# Patient Record
Sex: Female | Born: 1990
Health system: Southern US, Community
[De-identification: ages and names within clinical notes are randomized; demographics above are authoritative.]

## PROBLEM LIST (undated history)

## (undated) DIAGNOSIS — D649 Anemia, unspecified: Secondary | ICD-10-CM

## (undated) DIAGNOSIS — T7840XA Allergy, unspecified, initial encounter: Secondary | ICD-10-CM

## (undated) DIAGNOSIS — J45909 Unspecified asthma, uncomplicated: Secondary | ICD-10-CM

## (undated) DIAGNOSIS — F32A Depression, unspecified: Secondary | ICD-10-CM

## (undated) DIAGNOSIS — K219 Gastro-esophageal reflux disease without esophagitis: Secondary | ICD-10-CM

## (undated) DIAGNOSIS — O3440 Maternal care for other abnormalities of cervix, unspecified trimester: Secondary | ICD-10-CM

## (undated) DIAGNOSIS — K589 Irritable bowel syndrome without diarrhea: Secondary | ICD-10-CM

## (undated) DIAGNOSIS — F419 Anxiety disorder, unspecified: Secondary | ICD-10-CM

## (undated) DIAGNOSIS — R87612 Low grade squamous intraepithelial lesion on cytologic smear of cervix (LGSIL): Secondary | ICD-10-CM

## (undated) DIAGNOSIS — Z9889 Other specified postprocedural states: Secondary | ICD-10-CM

## (undated) HISTORY — DX: Low grade squamous intraepithelial lesion on cytologic smear of cervix (LGSIL): R87.612

## (undated) HISTORY — DX: Other specified postprocedural states: O34.40

## (undated) HISTORY — DX: Allergy, unspecified, initial encounter: T78.40XA

## (undated) HISTORY — PX: FRACTURE SURGERY: SHX138

## (undated) HISTORY — PX: INDUCED ABORTION: SHX677

## (undated) HISTORY — DX: Unspecified asthma, uncomplicated: J45.909

## (undated) HISTORY — PX: COLPOSCOPY: SHX161

## (undated) HISTORY — PX: TONSILLECTOMY: SUR1361

## (undated) HISTORY — PX: DILATION AND CURETTAGE OF UTERUS: SHX78

## (undated) HISTORY — DX: Irritable bowel syndrome, unspecified: K58.9

## (undated) HISTORY — DX: Other specified postprocedural states: Z98.890

---
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
## 2009-04-21 DIAGNOSIS — Z720 Tobacco use: Secondary | ICD-10-CM | POA: Insufficient documentation

## 2009-04-21 DIAGNOSIS — J45909 Unspecified asthma, uncomplicated: Secondary | ICD-10-CM | POA: Insufficient documentation

## 2009-08-18 ENCOUNTER — Ambulatory Visit: Payer: Self-pay | Admitting: Gastroenterology

## 2009-08-24 ENCOUNTER — Ambulatory Visit: Payer: Self-pay | Admitting: Gastroenterology

## 2009-08-29 ENCOUNTER — Ambulatory Visit: Payer: Self-pay | Admitting: Gastroenterology

## 2009-11-16 DIAGNOSIS — K589 Irritable bowel syndrome without diarrhea: Secondary | ICD-10-CM | POA: Insufficient documentation

## 2009-11-16 DIAGNOSIS — K580 Irritable bowel syndrome with diarrhea: Secondary | ICD-10-CM | POA: Insufficient documentation

## 2010-04-04 ENCOUNTER — Ambulatory Visit: Payer: Self-pay | Admitting: Family Medicine

## 2011-07-03 ENCOUNTER — Emergency Department: Payer: Self-pay | Admitting: *Deleted

## 2012-01-10 ENCOUNTER — Ambulatory Visit: Payer: Self-pay | Admitting: Orthopedic Surgery

## 2012-01-10 LAB — PREGNANCY, URINE: Pregnancy Test, Urine: NEGATIVE m[IU]/mL

## 2012-08-26 ENCOUNTER — Ambulatory Visit: Payer: Self-pay | Admitting: Family Medicine

## 2014-12-12 NOTE — Op Note (Signed)
PATIENT NAME:  April Harris, April Harris MR#:  [id number] DATE OF BIRTH:  Sep 04, 1990  DATE OF PROCEDURE:  01/10/2012  PREOPERATIVE DIAGNOSIS: Retained birth control implant, left upper arm.   POSTOPERATIVE DIAGNOSIS: Retained birth control implant, left upper arm.    PROCEDURE: Removal of implantable birth control device, left upper arm.   SURGEON: Leitha SchullerMichael J. Jadesola Poynter, MD.   ANESTHESIA: General.   DESCRIPTION OF PROCEDURE: The patient was brought to the Operating Room and after adequate anesthesia was obtained, the left arm was prepped and draped in the usual sterile fashion. After patient identification and time-out procedures were completed, an incision was made proximal to where previous attempt had been made to remove the implant. An approximately 2 cm incision was made and the subcutaneous tissue spread. With a retractor placed, it is possible to spread the tissue and on palpation, the rod was palpable. It had a layer of connective tissue surrounding it. It seemed to be more of a layer of scar tissue. It was still at the level of the subcutaneous tissue and had not gone deep, but it was deep in the subcutaneous tissue. After separating the scar tissue overlying the rod, it was removed without difficulty. Next, the wound was thoroughly irrigated. Hemostasis was checked with electrocautery. The wound was then closed with 3-0 Vicryl subcutaneously, a subcuticular 4-0 Monocryl and Dermabond. The patient was sent to the recovery room in stable condition after infiltration of 10 mL of 0.5% Sensorcaine around the incision, 2 x 2's and tape. The implant was recorded photographically and then discarded.   ESTIMATED BLOOD LOSS: Minimal.  COMPLICATIONS: None.  ____________________________ Leitha SchullerMichael J. Mida Cory, MD mjm:ap D: 01/10/2012 20:57:43 ET T: 01/11/2012 11:08:46 ET JOB#: [id number] cc: Leitha SchullerMichael J. Damaria Vachon, MD, <Dictator> Leitha SchullerMICHAEL J Sharise Lippy MD ELECTRONICALLY SIGNED 01/11/2012 12:34

## 2016-02-13 DIAGNOSIS — Z3046 Encounter for surveillance of implantable subdermal contraceptive: Secondary | ICD-10-CM | POA: Diagnosis not present

## 2016-02-13 DIAGNOSIS — Z113 Encounter for screening for infections with a predominantly sexual mode of transmission: Secondary | ICD-10-CM | POA: Diagnosis not present

## 2016-02-13 DIAGNOSIS — Z3002 Counseling and instruction in natural family planning to avoid pregnancy: Secondary | ICD-10-CM | POA: Diagnosis not present

## 2016-04-20 DIAGNOSIS — Z3049 Encounter for surveillance of other contraceptives: Secondary | ICD-10-CM | POA: Diagnosis not present

## 2016-04-26 DIAGNOSIS — Z01419 Encounter for gynecological examination (general) (routine) without abnormal findings: Secondary | ICD-10-CM | POA: Diagnosis not present

## 2016-04-26 DIAGNOSIS — Z124 Encounter for screening for malignant neoplasm of cervix: Secondary | ICD-10-CM | POA: Diagnosis not present

## 2016-08-28 DIAGNOSIS — J111 Influenza due to unidentified influenza virus with other respiratory manifestations: Secondary | ICD-10-CM | POA: Diagnosis not present

## 2016-09-13 DIAGNOSIS — N760 Acute vaginitis: Secondary | ICD-10-CM | POA: Diagnosis not present

## 2016-09-13 DIAGNOSIS — B001 Herpesviral vesicular dermatitis: Secondary | ICD-10-CM | POA: Diagnosis not present

## 2017-01-01 DIAGNOSIS — J301 Allergic rhinitis due to pollen: Secondary | ICD-10-CM | POA: Diagnosis not present

## 2017-01-01 DIAGNOSIS — J4520 Mild intermittent asthma, uncomplicated: Secondary | ICD-10-CM | POA: Diagnosis not present

## 2017-01-01 DIAGNOSIS — F17210 Nicotine dependence, cigarettes, uncomplicated: Secondary | ICD-10-CM | POA: Diagnosis not present

## 2017-10-28 ENCOUNTER — Encounter: Payer: Self-pay | Admitting: Obstetrics & Gynecology

## 2017-10-28 ENCOUNTER — Emergency Department: Payer: BLUE CROSS/BLUE SHIELD

## 2017-10-28 ENCOUNTER — Ambulatory Visit (INDEPENDENT_AMBULATORY_CARE_PROVIDER_SITE_OTHER): Payer: BLUE CROSS/BLUE SHIELD | Admitting: Obstetrics & Gynecology

## 2017-10-28 ENCOUNTER — Emergency Department
Admission: EM | Admit: 2017-10-28 | Discharge: 2017-10-28 | Disposition: A | Discharge status: Home / Self Care | Payer: BLUE CROSS/BLUE SHIELD | Attending: Emergency Medicine | Admitting: Emergency Medicine

## 2017-10-28 ENCOUNTER — Encounter: Payer: Self-pay | Admitting: Emergency Medicine

## 2017-10-28 VITALS — BP 124/78 | Ht 65.0 in | Wt 172.0 lb

## 2017-10-28 DIAGNOSIS — Z23 Encounter for immunization: Secondary | ICD-10-CM | POA: Diagnosis not present

## 2017-10-28 DIAGNOSIS — F17200 Nicotine dependence, unspecified, uncomplicated: Secondary | ICD-10-CM | POA: Insufficient documentation

## 2017-10-28 DIAGNOSIS — Z124 Encounter for screening for malignant neoplasm of cervix: Secondary | ICD-10-CM | POA: Diagnosis not present

## 2017-10-28 DIAGNOSIS — N926 Irregular menstruation, unspecified: Secondary | ICD-10-CM | POA: Diagnosis not present

## 2017-10-28 DIAGNOSIS — S67190A Crushing injury of right index finger, initial encounter: Secondary | ICD-10-CM | POA: Insufficient documentation

## 2017-10-28 DIAGNOSIS — Y939 Activity, unspecified: Secondary | ICD-10-CM | POA: Insufficient documentation

## 2017-10-28 DIAGNOSIS — S61210A Laceration without foreign body of right index finger without damage to nail, initial encounter: Secondary | ICD-10-CM | POA: Insufficient documentation

## 2017-10-28 DIAGNOSIS — Y929 Unspecified place or not applicable: Secondary | ICD-10-CM | POA: Insufficient documentation

## 2017-10-28 DIAGNOSIS — Y998 Other external cause status: Secondary | ICD-10-CM | POA: Insufficient documentation

## 2017-10-28 DIAGNOSIS — W3189XA Contact with other specified machinery, initial encounter: Secondary | ICD-10-CM | POA: Diagnosis not present

## 2017-10-28 DIAGNOSIS — S6710XA Crushing injury of unspecified finger(s), initial encounter: Secondary | ICD-10-CM

## 2017-10-28 DIAGNOSIS — S6981XA Other specified injuries of right wrist, hand and finger(s), initial encounter: Secondary | ICD-10-CM | POA: Diagnosis not present

## 2017-10-28 MED ORDER — OXYCODONE-ACETAMINOPHEN 5-325 MG PO TABS: 1.0000 | ORAL_TABLET | Freq: Three times a day (TID) | ORAL | 0 refills | Status: DC | PRN

## 2017-10-28 MED ORDER — TETANUS-DIPHTH-ACELL PERTUSSIS 5-2.5-18.5 LF-MCG/0.5 IM SUSP
0.5000 mL | Freq: Once | INTRAMUSCULAR | Status: AC
  Administered 2017-10-28: 0.5 mL via INTRAMUSCULAR
  Filled 2017-10-28: qty 0.5

## 2017-10-28 MED ORDER — OXYCODONE-ACETAMINOPHEN 5-325 MG PO TABS
ORAL_TABLET | ORAL | Status: AC
  Administered 2017-10-28: 2 via ORAL
  Filled 2017-10-28: qty 2

## 2017-10-28 MED ORDER — LIDOCAINE-EPINEPHRINE 1 %-1:100000 IJ SOLN
10.0000 mL | Freq: Once | INTRAMUSCULAR | Status: DC
  Filled 2017-10-28: qty 10

## 2017-10-28 MED ORDER — LIDOCAINE-EPINEPHRINE (PF) 1 %-1:200000 IJ SOLN
INTRAMUSCULAR | Status: AC
  Administered 2017-10-28: 10 mL via INTRADERMAL
  Filled 2017-10-28: qty 30

## 2017-10-28 MED ORDER — LIDOCAINE-EPINEPHRINE (PF) 2 %-1:200000 IJ SOLN
10.0000 mL | Freq: Once | INTRAMUSCULAR | Status: DC
  Filled 2017-10-28: qty 10

## 2017-10-28 MED ORDER — OXYCODONE-ACETAMINOPHEN 5-325 MG PO TABS
2.0000 | ORAL_TABLET | Freq: Once | ORAL | Status: AC
  Administered 2017-10-28: 2 via ORAL

## 2017-10-28 MED ORDER — LIDOCAINE-EPINEPHRINE (PF) 1 %-1:200000 IJ SOLN
10.0000 mL | Freq: Once | INTRAMUSCULAR | Status: AC
  Administered 2017-10-28: 10 mL via INTRADERMAL

## 2017-10-28 MED ORDER — MEDROXYPROGESTERONE ACETATE 10 MG PO TABS: 10.0000 mg | ORAL_TABLET | Freq: Every day | ORAL | 1 refills | Status: DC

## 2017-10-28 NOTE — ED Notes (Signed)
Dr Mayford Knifewilliams at bedside suturing laceration

## 2017-10-28 NOTE — Patient Instructions (Signed)

## 2017-10-28 NOTE — ED Provider Notes (Signed)
[facility] Emergency Department Provider Note       Time seen: ----------------------------------------- 1:06 PM on 10/28/2017 -----------------------------------------   I have reviewed the triage vital signs and the nursing notes.  HISTORY   Chief Complaint Finger Injury and Trauma    HPI April Harris is a 27 y.o. female with no significant past medical history who presents to the ED for impression injury to her right index finger.  Patient was using a hydraulic wood splitter when her finger slipped.  She was noted to have an open laceration noted to the right index finger.  She denies any other complaints at this time.  Pain is 9 out of 10 in the right finger.  History reviewed. No pertinent past medical history.  There are no active problems to display for this patient.   History reviewed. No pertinent surgical history.  Allergies Penicillins  Social History Social History   Tobacco Use  . Smoking status: Current Every Day Smoker  . Smokeless tobacco: Never Used  Substance Use Topics  . Alcohol use: Yes  . Drug use: No   Review of Systems Constitutional: Negative for fever. Cardiovascular: Negative for chest pain. Respiratory: Negative for shortness of breath. Gastrointestinal: Negative for abdominal pain, vomiting and diarrhea. Musculoskeletal: Positive for pain in the right index finger Skin: Positive for finger laceration Neurological: Negative for headaches, focal weakness or numbness.  All systems negative/normal/unremarkable except as stated in the HPI  ____________________________________________   PHYSICAL EXAM:  VITAL SIGNS: ED Triage Vitals  Enc Vitals Group     BP 10/28/17 1242 (!) 122/57     Pulse Rate 10/28/17 1242 75     Resp 10/28/17 1242 20     Temp 10/28/17 1242 97.6 F (36.4 C)     Temp Source 10/28/17 1242 Oral     SpO2 10/28/17 1242 100 %     Weight 10/28/17 1243 170 lb (77.1 kg)     Height  10/28/17 1243 5\' 5"  (1.651 m)     Head Circumference --      Peak Flow --      Pain Score 10/28/17 1243 10     Pain Loc --      Pain Edu? --      Excl. in GC? --    Constitutional: Alert and oriented.  Mild distress from pain Eyes: Conjunctivae are normal. Normal extraocular movements. Musculoskeletal: Laceration and crush injury is evident in the right index finger, particularly medially in anatomical position over the middle phalanx.  There is petechia and hemorrhagic bulla in the index finger distally Neurologic:  Normal speech and language. No gross focal neurologic deficits are appreciated.  Normal sensory and motor function is appreciated to the right index finger Skin: Laceration over the right index finger as dictated above Psychiatric: Mood and affect are normal. Speech and behavior are normal.  ____________________________________________  ED COURSE:  As part of my medical decision making, I reviewed the following data within the electronic MEDICAL RECORD NUMBER History obtained from family if available, nursing notes, old chart and ekg, as well as notes from prior ED visits. Patient presented for crush injury to the right index finger, we will assess with imaging as indicated at this time.   Marland Kitchen..Laceration Repair Date/Time: 10/28/2017 1:37 PM Performed by: Emily FilbertWilliams, Jonathan E, MD Authorized by: Emily FilbertWilliams, Jonathan E, MD   Consent:    Consent obtained:  Verbal   Consent given by:  Patient   Risks discussed:  Infection, pain,  retained foreign body, poor cosmetic result and poor wound healing Anesthesia (see MAR for exact dosages):    Anesthesia method:  Local infiltration   Local anesthetic:  Lidocaine 1% WITH epi Laceration details:    Location:  Finger   Length (cm):  3.5   Depth (mm):  5 Repair type:    Repair type:  Intermediate Exploration:    Hemostasis achieved with:  Direct pressure   Wound exploration: entire depth of wound probed and visualized     Contaminated:  no   Treatment:    Area cleansed with:  Saline   Amount of cleaning:  Extensive   Irrigation solution:  Sterile saline   Visualized foreign bodies/material removed: no   Skin repair:    Repair method:  Sutures   Suture size:  4-0   Suture material:  Prolene   Suture technique:  Running   Number of sutures:  10 Approximation:    Approximation:  Close Post-procedure details:    Dressing:  Sterile dressing   Patient tolerance of procedure:  Tolerated well, no immediate complications  Wound edges were revised during the repair ____________________________________________   RADIOLOGY Images were viewed by me  Right index finger x-rays reveal soft tissue deficit no bony abnormality Are unremarkable ____________________________________________  DIFFERENTIAL DIAGNOSIS   Fracture, contusion, crush injury, tendon injury, laceration  FINAL ASSESSMENT AND PLAN  Crush injury, finger laceration   Plan: The patient had presented for crush injury to the right index finger. Patient's imaging did not reveal any fractures.  Patient with extensive right index finger laceration repair without any obvious tendinous involvement.  Good sensorimotor function to the finger.  She will be referred to orthopedics for follow-up.   Ulice Dash, MD   Note: This note was generated in part or whole with voice recognition software. Voice recognition is usually quite accurate but there are transcription errors that can and very often do occur. I apologize for any typographical errors that were not detected and corrected.     Emily Filbert, MD 10/28/17 1339

## 2017-10-28 NOTE — ED Triage Notes (Signed)
Pt reports crushed her right hand index finger in a hydraulic wood splitter. Pt with open laceration noted, bleeding controlled at this time.

## 2017-10-28 NOTE — ED Notes (Signed)
Patient has 2 long lacerations on her forefinger.  Patient has feeling in finger.  Patient reports finger was hurt by a woodsplitter approx. 30 min prior to arrival.

## 2017-10-28 NOTE — Progress Notes (Signed)
HPI:      Ms. April Harris is a 27 y.o. G3P0020 who LMP was Patient's last menstrual period was 09/29/2017., presents today for a problem visit.  She complains of menorrhagia that  began about a month ago and its severity is described as moderate.  She has irreg periods on Nexplanon (for years, has had several, on 18 mos w this one) and they are associated with mild menstrual cramping.  She has used the following for attempts at control: hospital pad.  RECENT 30 DAY bleeding, incl today.  Previous evaluation: none. Prior Diagnosis: menorrhagia. Previous Treatment: Nexplanon for years, usually has them changed earlier than 3 years.  Last PAP 04/2016  PMHx: She  has no past medical history on file. Also,  has no past surgical history on file., family history is not on file.,  reports that she has been smoking.  she has never used smokeless tobacco. She reports that she drinks alcohol. She reports that she does not use drugs.  She  Current Outpatient Medications:  .  etonogestrel (NEXPLANON) 68 MG IMPL implant, Inject into the skin., Disp: , Rfl:  .  medroxyPROGESTERone (PROVERA) 10 MG tablet, Take 1 tablet (10 mg total) by mouth daily for 10 days., Disp: 10 tablet, Rfl: 1  Also, is allergic to penicillins.  Review of Systems  Constitutional: Negative for chills, fever and malaise/fatigue.  HENT: Negative for congestion, sinus pain and sore throat.   Eyes: Negative for blurred vision and pain.  Respiratory: Negative for cough and wheezing.   Cardiovascular: Negative for chest pain and leg swelling.  Gastrointestinal: Negative for abdominal pain, constipation, diarrhea, heartburn, nausea and vomiting.  Genitourinary: Negative for dysuria, frequency, hematuria and urgency.  Musculoskeletal: Negative for back pain, joint pain, myalgias and neck pain.  Skin: Negative for itching and rash.  Neurological: Negative for dizziness, tremors and weakness.  Endo/Heme/Allergies: Does not  bruise/bleed easily.  Psychiatric/Behavioral: Negative for depression. The patient is not nervous/anxious and does not have insomnia.     Objective: BP 124/78   Ht 5\' 5"  (1.651 m)   Wt 172 lb (78 kg)   LMP 09/29/2017   BMI 28.62 kg/m  Physical Exam  Constitutional: She is oriented to person, place, and time. She appears well-developed and well-nourished. No distress.  Genitourinary: Rectum normal, vagina normal and uterus normal. Pelvic exam was performed with patient supine. There is no rash or lesion on the right labia. There is no rash or lesion on the left labia. Vagina exhibits no lesion. No bleeding in the vagina. Right adnexum does not display mass and does not display tenderness. Left adnexum does not display mass and does not display tenderness. Cervix does not exhibit motion tenderness, lesion, friability or polyp.   Uterus is mobile and midaxial. Uterus is not enlarged or exhibiting a mass.  HENT:  Head: Normocephalic and atraumatic. Head is without laceration.  Right Ear: Hearing normal.  Left Ear: Hearing normal.  Nose: No epistaxis.  No foreign bodies.  Mouth/Throat: Uvula is midline, oropharynx is clear and moist and mucous membranes are normal.  Eyes: Pupils are equal, round, and reactive to light.  Neck: Normal range of motion. Neck supple. No thyromegaly present.  Cardiovascular: Normal rate and regular rhythm. Exam reveals no gallop and no friction rub.  No murmur heard. Pulmonary/Chest: Effort normal and breath sounds normal. No respiratory distress. She has no wheezes. Right breast exhibits no mass, no skin change and no tenderness. Left breast exhibits no mass, no  skin change and no tenderness.  Abdominal: Soft. Bowel sounds are normal. She exhibits no distension. There is no tenderness. There is no rebound.  Musculoskeletal: Normal range of motion.  Neurological: She is alert and oriented to person, place, and time. No cranial nerve deficit.  Skin: Skin is warm  and dry.  Psychiatric: She has a normal mood and affect. Judgment normal.  Vitals reviewed.   ASSESSMENT/PLAN:  dysfunctional uterine bleeding and menorrhagia  Problem List Items Addressed This Visit    None    Visit Diagnoses    Irregular bleeding    -  Primary   Relevant Orders   US PELVIS TRANSVANGINAL NON-OB (TV ONLY)   Screening for cervical cancer       Relevant Orders   IGP, rfx Aptima HPV ASCU    Depo, IUD, other offered and discussed. Provera short term for prolonged period today  Patient has abnormal uterine bleeding . She has a normal exam today, with no evidence of lesions.  Evaluation includes the following: exam, labs such as hormonal testing, and pelvic ultrasound to evaluate for any structural gynecologic abnormalities.  Patient to follow up after testing.  Treatment option for menorrhagia or menometrorrhagia discussed in great detail with the patient.  Options include hormonal therapy, IUD therapy such as Mirena.  The pros and cons of each option discussed with patient.  To consider IUD at a later time.  Also consider OCP short term if Provera does not help  PAP today  Annamarie MajorPaul Marquize Seib, MD, Merlinda FrederickFACOG [facility], [facility] 10/28/2017  9:08 AM

## 2017-10-30 DIAGNOSIS — F4323 Adjustment disorder with mixed anxiety and depressed mood: Secondary | ICD-10-CM | POA: Diagnosis not present

## 2017-10-30 LAB — IGP, RFX APTIMA HPV ASCU: PAP Smear Comment: 0

## 2017-11-07 ENCOUNTER — Encounter: Payer: Self-pay | Admitting: Obstetrics & Gynecology

## 2017-11-07 ENCOUNTER — Ambulatory Visit (INDEPENDENT_AMBULATORY_CARE_PROVIDER_SITE_OTHER): Payer: BLUE CROSS/BLUE SHIELD | Admitting: Obstetrics & Gynecology

## 2017-11-07 ENCOUNTER — Ambulatory Visit (INDEPENDENT_AMBULATORY_CARE_PROVIDER_SITE_OTHER): Payer: BLUE CROSS/BLUE SHIELD

## 2017-11-07 VITALS — BP 110/62 | HR 64 | Ht 65.0 in | Wt 171.0 lb

## 2017-11-07 DIAGNOSIS — N920 Excessive and frequent menstruation with regular cycle: Secondary | ICD-10-CM

## 2017-11-07 DIAGNOSIS — N926 Irregular menstruation, unspecified: Secondary | ICD-10-CM

## 2017-11-07 NOTE — Progress Notes (Signed)
  HPI: Pt has had irreg and prolomged bleeding esp this past month.  Has had many Nexplanons but more bleeding concerns recently.  Recent Provera stopped the 30 days of bleeding she had had prior.  Ultrasound demonstrates no masses seen, no cysts These findings are Pelvis normal  PMHx: She  has no past medical history on file. Also,  has no past surgical history on file., family history is not on file.,  reports that she has been smoking.  She has never used smokeless tobacco. She reports that she drinks alcohol. She reports that she does not use drugs.  She has a current medication list which includes the following prescription(s): etonogestrel, medroxyprogesterone, and oxycodone-acetaminophen. Also, is allergic to penicillins.  Review of Systems  All other systems reviewed and are negative.  Objective: BP 110/62   Pulse 64   Ht 5\' 5"  (1.651 m)   Wt 171 lb (77.6 kg)   LMP 10/27/2017 (Exact Date)   BMI 28.46 kg/m   Physical examination Constitutional NAD, Conversant  Skin No rashes, lesions or ulceration.   Extremities: Moves all appropriately.  Normal ROM for age. No lymphadenopathy.  Neuro: Grossly intact  Psych: Oriented to PPT.  Normal mood. Normal affect.   [place] Pelvis Transvanginal Non-ob (tv Only)  Result Date: 11/07/2017 ULTRASOUND REPORT Location: [facility] Date of Service: 11/07/2017 Indications:Irregular bleeding Findings: The uterus is anteverted and measures 6.60 x 3.61 x 3.01cm. Echo texture is homogenous without evidence of focal masses. The Endometrium measures 1.86 mm. Right Ovary measures 2.95 x 2.21 x 2.26 cm. It is normal in appearance. Left Ovary measures 2.88 x 2.74 x 1.88 cm. It is normal in appearance. Survey of the adnexa demonstrates no adnexal masses. There is no free fluid in the cul de sac. Impression: 1. Normal gyn ultrasound. Recommendations: 1.Clinical correlation with the patient's History and Physical Exam. Willette AlmaKristen Priestley, RDMS, RVT Review of  ULTRASOUND.    [facility] reviewed images and [facility]    Plan of management to be discussed with patient. Annamarie MajorPaul Markelle Asaro, MD, FACOG [facility], [facility] 11/07/2017  10:23 AM   Assessment:  Menorrhagia with regular cycle See how cycles do after comes off Provera Considering IUD (MIRENA) if Nexplanon continues to not control cycles  A total of 15 minutes were spent face-to-face with the patient during this encounter and over half of that time dealt with counseling and coordination of care.  Annamarie MajorPaul Aleathea Pugmire, MD, Merlinda FrederickFACOG [facility], [facility] 11/07/2017  10:32 AM

## 2017-11-12 ENCOUNTER — Ambulatory Visit: Payer: BLUE CROSS/BLUE SHIELD | Admitting: Family Medicine

## 2017-11-12 ENCOUNTER — Encounter: Payer: Self-pay | Admitting: Family Medicine

## 2017-11-12 VITALS — BP 118/80 | HR 75 | Temp 98.0°F | Wt 168.6 lb

## 2017-11-12 DIAGNOSIS — S6710XD Crushing injury of unspecified finger(s), subsequent encounter: Secondary | ICD-10-CM | POA: Diagnosis not present

## 2017-11-12 DIAGNOSIS — E785 Hyperlipidemia, unspecified: Secondary | ICD-10-CM | POA: Insufficient documentation

## 2017-11-12 DIAGNOSIS — O039 Complete or unspecified spontaneous abortion without complication: Secondary | ICD-10-CM | POA: Insufficient documentation

## 2017-11-12 DIAGNOSIS — E7800 Pure hypercholesterolemia, unspecified: Secondary | ICD-10-CM | POA: Insufficient documentation

## 2017-11-12 NOTE — Progress Notes (Signed)
Patient: April Harris Female    DOB: [birth year]   26 y.o.   MRN: [id number] Visit Date: 11/12/2017  Today's Provider: Dortha Kernennis Shaya Altamura, PA   Chief Complaint  Patient presents with  . Suture / Staple Removal   Subjective:    Suture / Staple Removal  Treated in ED: 10/28/17. She tried regular soap and water washings since the wound repair. Her temperature was unmeasured prior to arrival. There has been no drainage from the wound. There is no redness present. There is no swelling present. The pain has improved. Difficulty Moving Extremity/Digit: limited mobility    History reviewed. No pertinent past medical history. Patient Active Problem List   Diagnosis Date Noted  . Abortion 11/12/2017  . Abortion, spontaneous 11/12/2017  . Hypercholesterolemia 11/12/2017  . Menorrhagia with regular cycle 11/07/2017  . 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 Asthma 04/21/2009  . Current tobacco use 04/21/2009   History reviewed. No pertinent surgical history. History reviewed. No pertinent family history.  Allergies  Allergen Reactions  . Penicillins     Current Outpatient Medications:  .  etonogestrel (NEXPLANON) 68 MG IMPL implant, Inject into the skin., Disp: , Rfl:   Review of Systems  Constitutional: Negative.   Respiratory: Negative.   Cardiovascular: Negative.   Skin:       Laceration right index finger    Social History   Tobacco Use  . Smoking status: Current Every Day Smoker  . Smokeless tobacco: Never Used  Substance Use Topics  . Alcohol use: Yes   Objective:   BP 118/80 (BP Location: Right Arm, Patient Position: Sitting, Cuff Size: Normal)   Pulse 75   Temp 98 F (36.7 C) (Oral)   Wt 168 lb 9.6 oz (76.5 kg)   LMP 10/27/2017 (Exact Date)   SpO2 99%   BMI 28.06 kg/m  Vitals:   11/12/17 0846  BP: 118/80  Pulse: 75  Temp: 98 F (36.7 C)  TempSrc: Oral  SpO2: 99%  Weight: 168 lb 9.6 oz (76.5 kg)   Physical Exam  Constitutional: She is oriented to  person, place, and time. She appears well-developed and well-nourished. No distress.  HENT:  Head: Normocephalic and atraumatic.  Right Ear: Hearing normal.  Left Ear: Hearing normal.  Nose: Nose normal.  Eyes: Conjunctivae and lids are normal. Right eye exhibits no discharge. Left eye exhibits no discharge. No scleral icterus.  Pulmonary/Chest: Effort normal. No respiratory distress.  Musculoskeletal:  3.25 cm laceration right index finger with sutures in place under a liquid Band-Aid. No purulent drainage or erythema. Numbness in the tip of the finger.  Neurological: She is alert and oriented to person, place, and time.  Skin: Skin is intact. No lesion and no rash noted.  Psychiatric: She has a normal mood and affect. Her speech is normal and behavior is normal. Thought content normal.      Assessment & Plan:     1. Crush injury to finger, subsequent encounter Crush injury working with a wood splitter 2 weeks ago. Tdap was given in the ER and x-rays did not show any bone fractures. Laceration was a burst-type injury. Petechiae/broken capillaries in the tip of the finger are healing. Numbness in tip of finger is still present. Stiffness to try to flex finger is improving slowly. The "10 running sutures" were removed and double Band-Aid with antibiotic ointment was applied. Change dressing daily and expect flexion to return slowly. No loss in full extension. Recheck prn.  Vernie Murders, PA  [facility]

## 2017-11-13 DIAGNOSIS — F4323 Adjustment disorder with mixed anxiety and depressed mood: Secondary | ICD-10-CM | POA: Diagnosis not present

## 2017-11-19 DIAGNOSIS — F4323 Adjustment disorder with mixed anxiety and depressed mood: Secondary | ICD-10-CM | POA: Diagnosis not present

## 2017-11-27 DIAGNOSIS — F4323 Adjustment disorder with mixed anxiety and depressed mood: Secondary | ICD-10-CM | POA: Diagnosis not present

## 2017-12-04 DIAGNOSIS — F4323 Adjustment disorder with mixed anxiety and depressed mood: Secondary | ICD-10-CM | POA: Diagnosis not present

## 2017-12-12 DIAGNOSIS — F4323 Adjustment disorder with mixed anxiety and depressed mood: Secondary | ICD-10-CM | POA: Diagnosis not present

## 2017-12-26 DIAGNOSIS — F4323 Adjustment disorder with mixed anxiety and depressed mood: Secondary | ICD-10-CM | POA: Diagnosis not present

## 2018-01-07 DIAGNOSIS — F4323 Adjustment disorder with mixed anxiety and depressed mood: Secondary | ICD-10-CM | POA: Diagnosis not present

## 2018-01-28 DIAGNOSIS — F4323 Adjustment disorder with mixed anxiety and depressed mood: Secondary | ICD-10-CM | POA: Diagnosis not present

## 2018-03-04 DIAGNOSIS — F4323 Adjustment disorder with mixed anxiety and depressed mood: Secondary | ICD-10-CM | POA: Diagnosis not present

## 2018-05-27 ENCOUNTER — Encounter: Payer: Self-pay | Admitting: Family Medicine

## 2018-05-27 ENCOUNTER — Ambulatory Visit: Payer: BLUE CROSS/BLUE SHIELD | Admitting: Family Medicine

## 2018-05-27 VITALS — BP 124/70 | HR 77 | Temp 98.1°F | Resp 16 | Wt 180.2 lb

## 2018-05-27 DIAGNOSIS — J069 Acute upper respiratory infection, unspecified: Secondary | ICD-10-CM

## 2018-05-27 NOTE — Patient Instructions (Signed)
Discussed use of Mucinex D for congestion, Delsym for cough, and Benadryl for postnasal drainage. Let us know if not improving after the weekend.

## 2018-05-27 NOTE — Progress Notes (Signed)
  Subjective:     Patient ID: April Harris, female   DOB: 05-29-1991, 29 y.o.   MRN: [id number] Chief Complaint  Patient presents with  . Sinus Problem    Patient comes in office today with complaints of sinus pain and pressure for the past 48hrs. Patient states that she has had runny nose and bilateral ear pain, this morning she developed a cough. Patient has tried otc Mucinex PM   HPI Also has tried a Environmental consultant with transient relief. Currently using nicotine patches. Asthma is not an active problem  Review of Systems     Objective:   Physical Exam  Constitutional: She appears well-developed and well-nourished. No distress.  Ears: T.M's intact without inflammation Sinuses: mild paranasal tenderness Throat: tonsils absent; mild posterior pharyngeal erythema Neck: no cervical adenopathy Lungs: clear     Assessment:    1. Viral upper respiratory tract infection     Plan:    Discussed use of Mucinex D for congestion, Delsym for cough, and Benadryl for postnasal drainage. To call on or about 10/14 if not improving.

## 2018-05-29 ENCOUNTER — Telehealth: Payer: Self-pay | Admitting: Family Medicine

## 2018-05-29 NOTE — Telephone Encounter (Signed)
Your symptoms are still consistent with a viral upper respiratory infection if you do not have a high fever. Would continue Mucinex. I can send in a hydrocodone based cough syrup if you wish.

## 2018-05-29 NOTE — Telephone Encounter (Signed)
Pt requesting antibiotic sent to the pharmacy.  Pt states she is having worsening chest cong, coughing up quarter size dark green/drown chunks of phlegm.  States she is taking Mucinex and Delsym and it has not improved at all.   [facility] [address]

## 2018-05-29 NOTE — Telephone Encounter (Signed)
Please review.kw 

## 2018-05-29 NOTE — Telephone Encounter (Signed)
Patient was advised she states that she will just continue her regime. KW

## 2018-07-31 ENCOUNTER — Encounter: Payer: Self-pay | Admitting: Family Medicine

## 2018-07-31 ENCOUNTER — Ambulatory Visit: Payer: BLUE CROSS/BLUE SHIELD | Admitting: Family Medicine

## 2018-07-31 ENCOUNTER — Other Ambulatory Visit: Payer: Self-pay

## 2018-07-31 VITALS — BP 120/84 | HR 92 | Temp 98.5°F | Ht 65.0 in | Wt 174.8 lb

## 2018-07-31 DIAGNOSIS — J029 Acute pharyngitis, unspecified: Secondary | ICD-10-CM | POA: Diagnosis not present

## 2018-07-31 DIAGNOSIS — R350 Frequency of micturition: Secondary | ICD-10-CM | POA: Diagnosis not present

## 2018-07-31 DIAGNOSIS — N3090 Cystitis, unspecified without hematuria: Secondary | ICD-10-CM | POA: Diagnosis not present

## 2018-07-31 LAB — POCT URINALYSIS DIPSTICK
Bilirubin, UA: NEGATIVE
Blood, UA: NEGATIVE
Glucose, UA: NEGATIVE
LEUKOCYTES UA: NEGATIVE
NITRITE UA: NEGATIVE
PH UA: 6 (ref 5.0–8.0)
Protein, UA: NEGATIVE
Spec Grav, UA: <=1.005 — AB (ref 1.010–1.025)
Urobilinogen, UA: 0.2 E.U./dL

## 2018-07-31 LAB — POCT RAPID STREP A (OFFICE): RAPID STREP A SCREEN: NEGATIVE

## 2018-07-31 MED ORDER — NITROFURANTOIN MONOHYD MACRO 100 MG PO CAPS: 100.0000 mg | ORAL_CAPSULE | Freq: Two times a day (BID) | ORAL | 0 refills | Status: DC

## 2018-07-31 NOTE — Progress Notes (Signed)
  Subjective:     Patient ID: Ardeen FillersJessica R Carriger, female   DOB: 25-Dec-1990, 27 y.o.   MRN: [id number] Chief Complaint  Patient presents with  . Urinary Tract Infection    started about a month ago 07/12/18  . Sore Throat    started tuesday morning 07/29/18 glands very swollen, hard to swallow, sneezing or coughing is very painful   HPI States she has been treating her urinary sx with lots of water and cranberry pills which give her transient improvement. No fever or unusual vaginal discharge reported.  Review of Systems     Objective:   Physical Exam Constitutional:      General: She is not in acute distress.    Appearance: She is well-developed.  Neurological:     Mental Status: She is alert.   Ears: T.M's intact without inflammation Throat: tonsils are absent Neck: anterior cervical tenderness Lungs: clear     Assessment:    1. Sore throat; suspect early URI - POCT rapid strep A  2. Frequency of urination - POCT Urinalysis Dipstick  3. Cystitis: rx Macrobid x 3 days    Plan:    Discussed salt water gargles and ibuprofen for throat discomfort.

## 2018-07-31 NOTE — Patient Instructions (Signed)
Discussed use of salt water gargles and ibuprofen for throat pain. Expect sinus congestion and cough to follow. Minimize smoking while ill.

## 2018-08-04 ENCOUNTER — Other Ambulatory Visit: Payer: Self-pay | Admitting: Family Medicine

## 2018-08-04 ENCOUNTER — Telehealth: Payer: Self-pay | Admitting: Family Medicine

## 2018-08-04 MED ORDER — NITROFURANTOIN MONOHYD MACRO 100 MG PO CAPS: 100.0000 mg | ORAL_CAPSULE | Freq: Two times a day (BID) | ORAL | 0 refills | Status: DC

## 2018-08-04 NOTE — Telephone Encounter (Signed)
Patient advised.KW 

## 2018-08-04 NOTE — Telephone Encounter (Signed)
Have sent in more nitrofurantoin to complete a 7 day course. If not resolved would repeat urine testing.

## 2018-08-04 NOTE — Telephone Encounter (Signed)
Pt's  UTI is not completely over - pt needing more of the  nitrofurantoin, macrocrystal-monohydrate, (MACROBID) 100 MG capsule.  Please fill at: [id number] [place], [place] - [address] [phone] (Phone) [phone] (Fax)   Thanks, [employer]

## 2018-08-04 NOTE — Telephone Encounter (Signed)
Patient was seen by you on 07/31/18 and prescribed Macrobid for 3 days. Patient reports that she is still having urgency and dysuria. Please review chart and advise. KW

## 2018-09-11 ENCOUNTER — Encounter: Payer: Self-pay | Admitting: Family Medicine

## 2018-09-11 ENCOUNTER — Ambulatory Visit
Admission: RE | Admit: 2018-09-11 | Discharge: 2018-09-11 | Disposition: A | Discharge status: Home / Self Care | Payer: BLUE CROSS/BLUE SHIELD | Source: Ambulatory Visit | Attending: Family Medicine | Admitting: Family Medicine

## 2018-09-11 ENCOUNTER — Ambulatory Visit
Admission: RE | Admit: 2018-09-11 | Discharge: 2018-09-11 | Disposition: A | Discharge status: Home / Self Care | Payer: BLUE CROSS/BLUE SHIELD | Attending: Family Medicine | Admitting: Family Medicine

## 2018-09-11 ENCOUNTER — Ambulatory Visit: Payer: BLUE CROSS/BLUE SHIELD | Admitting: Family Medicine

## 2018-09-11 VITALS — BP 110/70 | HR 85 | Temp 98.0°F | Resp 16 | Wt 172.6 lb

## 2018-09-11 DIAGNOSIS — M545 Low back pain: Secondary | ICD-10-CM

## 2018-09-11 DIAGNOSIS — Z23 Encounter for immunization: Secondary | ICD-10-CM

## 2018-09-11 DIAGNOSIS — G8929 Other chronic pain: Secondary | ICD-10-CM | POA: Insufficient documentation

## 2018-09-11 NOTE — Progress Notes (Signed)
Patient: April Harris Female    DOB: August 04, 1991   28 y.o.   MRN: [id number] Visit Date: 09/11/2018  Today's Provider: Dortha Kernennis Jamail Cullers, PA   Chief Complaint  Patient presents with  . Back Pain   Subjective:     Back Pain  This is a chronic problem. Episode onset: Reports that this one started this Monday morning. The problem occurs constantly. The problem has been gradually worsening since onset. The pain is present in the lumbar spine and sacro-iliac. The quality of the pain is described as aching, cramping, shooting and stabbing. The pain radiates to the right thigh and right knee. Pain scale: 4/10 in the AM and 7/10 in afternoon. The pain is moderate. The pain is worse during the night. The symptoms are aggravated by sitting. Stiffness is present all day. Associated symptoms include numbness. Pertinent negatives include no tingling or weakness. 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 Dr. Philemon Kingdomawn Noah Sr.) for the symptoms. The treatment provided no relief.  Patient wants an xray done  History reviewed. No pertinent past medical history. Patient Active Problem List   Diagnosis Date Noted  . Abortion 11/12/2017  . Abortion, spontaneous 11/12/2017  . Hypercholesterolemia 11/12/2017  . Menorrhagia with regular cycle 11/07/2017  . 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 Asthma 04/21/2009  . Current tobacco use 04/21/2009   History reviewed. No pertinent surgical history.   History reviewed. No pertinent family history.  Allergies  Allergen Reactions  . Penicillins     Current Outpatient Medications:  .  etonogestrel (NEXPLANON) 68 MG IMPL implant, Inject into the skin., Disp: , Rfl:  .  nitrofurantoin, macrocrystal-monohydrate, (MACROBID) 100 MG capsule, Take 1 capsule (100 mg total) by mouth 2 (two) times daily. (Patient not taking: Reported  on 09/11/2018), Disp: 8 capsule, Rfl: 0  Review of Systems  Musculoskeletal: Positive for back pain.  Neurological: Positive for numbness. Negative for tingling and weakness.   Social History   Tobacco Use  . Smoking status: Current Every Day Smoker  . Smokeless tobacco: Never Used  Substance Use Topics  . Alcohol use: Yes     Objective:   BP 110/70 (BP Location: Right Arm, Patient Position: Standing, Cuff Size: Normal)   Pulse 85   Temp 98 F (36.7 C) (Oral)   Resp 16   Wt 172 lb 9.6 oz (78.3 kg)   LMP 08/30/2018   SpO2 97%   BMI 28.72 kg/m  Vitals:   09/11/18 0851  BP: 110/70  Pulse: 85  Resp: 16  Temp: 98 F (36.7 C)  TempSrc: Oral  SpO2: 97%  Weight: 172 lb 9.6 oz (78.3 kg)   Physical Exam Constitutional:      General: She is not in acute distress.    Appearance: She is well-developed.  HENT:     Head: Normocephalic and atraumatic.     Right Ear: Hearing normal.     Left Ear: Hearing normal.     Nose: Nose normal.  Eyes:     General: Lids are normal. No scleral icterus.       Right eye: No discharge.        Left eye: No discharge.     Conjunctiva/sclera: Conjunctivae normal.  Neck:     Musculoskeletal: Neck supple.  Pulmonary:     Effort: Pulmonary effort is normal. No respiratory distress.  Musculoskeletal:        General: Tenderness present.     Comments: Right lumbar tenderness near SI joint and across sacrum. Some pain in the right lower back to test SLR's to 90 degrees or rotate hip. Good strength and DTR's symmetric.  Skin:    Findings: No lesion or rash.  Neurological:     Mental Status: She is alert and oriented to person, place, and time.     Motor: No weakness.     Deep Tendon Reflexes: Reflexes normal.  Psychiatric:        Speech: Speech normal.        Behavior: Behavior normal.        Thought Content: Thought content normal.       Assessment & Plan    1. Chronic right-sided low back pain, unspecified whether sciatica  present Flare of low back pain 09-08-18 without specific injury known. Woke up with pain and difficulty moving in the right low back and sacrum area. Gives history of frequent low back ache since age 28 when chiropractor diagnosed spondylolisthesis. Has been using Aleve or Ibuprofen with some muscle relaxer at bedtime. Some help from BioFreeze and moist heat applications. May use Ibuprofen 800 mg TID with food and Percogesic during the day for muscle spasms. Check L-S spine x-ray to document condition of spine. Recheck prn reports. - DG Lumbar Spine Complete  2. Need for influenza vaccination - Flu Vaccine QUAD 36+ mos IM     Dortha Kernennis Kaili Castille, PA  [facility]

## 2018-09-11 NOTE — Patient Instructions (Signed)

## 2018-09-12 ENCOUNTER — Telehealth: Payer: Self-pay

## 2018-09-12 NOTE — Telephone Encounter (Signed)
-----   Message from Tamsen Roers, [place] sent at 09/11/2018  6:25 PM EST ----- Minimal spondylolisthesis of L5 on S1 anteriorly (only 4 mm shift). No scoliosis or fracture. A little pars defect is reported by radiologist. Usually heals in 6-12 weeks with anti-inflammatory medication and sometimes use of a lumbar supporter belt. Should have these films compared to chiropractors x-rays to see if this has always been there.

## 2018-09-12 NOTE — Telephone Encounter (Signed)
Patient advised as directed below.  Thanks,  -Sicilia Killough 

## 2018-09-15 ENCOUNTER — Telehealth: Payer: Self-pay | Admitting: Family Medicine

## 2018-09-15 NOTE — Telephone Encounter (Signed)
Patient advised an scheduled follow up appointment

## 2018-09-15 NOTE — Telephone Encounter (Signed)
Patient seen 09/11/18 by Maurine Minister for back pain.   Patient called stating she tried to get her old xrays from chiropractor but they were taken 12 years ago and they do not keep them past 7 years.

## 2018-09-15 NOTE — Telephone Encounter (Signed)
Please Review

## 2018-09-15 NOTE — Telephone Encounter (Signed)
Schedule recheck appointment to assess progress.

## 2018-09-23 ENCOUNTER — Telehealth: Payer: Self-pay | Admitting: Obstetrics & Gynecology

## 2018-09-23 NOTE — Telephone Encounter (Signed)
Patient is schedule 10/06/18 at 3:30 to update annual exam. Patient is calling to find out if she could go ahead and have her Nexplanon removal (placed 04/26/16) and have Mirena insertion at this visit. Patient report this is her third Nexplanon and states toward the Third year mark of having the nexplanon she starts having abnormal bleeding. Please advise

## 2018-09-23 NOTE — Telephone Encounter (Signed)
Per Molli Knock wanting to know is it an insurance reasons or Medical reasons. Please advise

## 2018-09-23 NOTE — Telephone Encounter (Signed)
Cannot do annual, Nexpl removal, and IUD insertion (3 separate events) all in same day Would rec we do at least annual and then sch follow up for procedure day

## 2018-09-24 NOTE — Telephone Encounter (Signed)
Sent email to [facility] office manager to find out what insurance will cover.

## 2018-09-30 NOTE — Telephone Encounter (Signed)
Patient is schedule for 40 minutes for appointment to give provider extra time

## 2018-09-30 NOTE — Telephone Encounter (Signed)
Molli Knock  [facility] 09/29/2018 3:31 PM  Pas[place], [place] Dec ?  Please see email below. Insurance will pay for all three on the same day. Val Eagle Practice Administrator [facility] [address] [place], [place] [address] O: [phone] C: [phone] ?

## 2018-10-03 NOTE — Progress Notes (Signed)
Patient: April Harris Female    DOB: 1991-04-09   28 y.o.   MRN: [id number] Visit Date: 10/06/2018  Today's Provider: Dortha Kern, PA   Chief Complaint  Patient presents with  . Follow-up  . Back Pain   Subjective:     HPI   Chronic right-sided low back pain, unspecified whether sciatica present From 09/11/2018-May use Ibuprofen 800 mg TID with food and Percogesic during the day for muscle spasms. Check L-S spine x-ray to document condition of spine. Recheck prn reports.  History reviewed. No pertinent past medical history. Patient Active Problem List   Diagnosis Date Noted  . Abortion 11/12/2017  . Abortion, spontaneous 11/12/2017  . Hypercholesterolemia 11/12/2017  . Menorrhagia with regular cycle 11/07/2017  . 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 Asthma 04/21/2009  . Current tobacco use 04/21/2009   History reviewed. No pertinent surgical history.   History reviewed. No pertinent family history.  Allergies  Allergen Reactions  . Penicillins     Current Outpatient Medications:  .  etonogestrel (NEXPLANON) 68 MG IMPL implant, Inject into the skin., Disp: , Rfl:  .  Loratadine (CLARITIN) 10 MG CAPS, Take by mouth as needed., Disp: , Rfl:  .  nitrofurantoin, macrocrystal-monohydrate, (MACROBID) 100 MG capsule, Take 1 capsule (100 mg total) by mouth 2 (two) times daily. (Patient not taking: Reported on 09/11/2018), Disp: 8 capsule, Rfl: 0  Review of Systems  Constitutional: Negative for appetite change, chills, fatigue and fever.  Respiratory: Negative for chest tightness and shortness of breath.   Cardiovascular: Negative for chest pain and palpitations.  Gastrointestinal: Negative for abdominal pain, nausea and vomiting.  Neurological: Negative for dizziness and weakness.   Social History   Tobacco Use  . Smoking status: Current Every Day Smoker  . Smokeless tobacco: Never Used  Substance Use Topics  . Alcohol use: Yes     Objective:   BP (!)  100/54 (BP Location: Right Arm, Patient Position: Sitting, Cuff Size: Large)   Pulse 75   Temp 98.3 F (36.8 C) (Oral)   Resp 16   Wt 172 lb (78 kg)   SpO2 98%   BMI 28.62 kg/m  Vitals:   10/06/18 0810  BP: (!) 100/54  Pulse: 75  Resp: 16  Temp: 98.3 F (36.8 C)  TempSrc: Oral  SpO2: 98%  Weight: 172 lb (78 kg)   Physical Exam Constitutional:      General: She is not in acute distress.    Appearance: She is well-developed.  HENT:     Head: Normocephalic and atraumatic.     Right Ear: Hearing normal.     Left Ear: Hearing normal.     Nose: Nose normal.  Eyes:     General: Lids are normal. No scleral icterus.       Right eye: No discharge.        Left eye: No discharge.     Conjunctiva/sclera: Conjunctivae normal.  Pulmonary:     Effort: Pulmonary effort is normal. No respiratory distress.  Musculoskeletal: Normal range of motion.     Comments: Some "normal" soreness with occasional decreased sensation in the lateral thigh and hip. DTR's symmetric. SLR's 90 degrees without pain.  Skin:    Findings: Lesion present. No rash.     Comments: Plantar warts left foot. Some pain and lump sensation when walking. One on the great toe and one over the first MTP joint.  Neurological:     Mental Status: She is  alert and oriented to person, place, and time.  Psychiatric:        Speech: Speech normal.        Behavior: Behavior normal.        Thought Content: Thought content normal.       Assessment & Plan    1. Chronic right-sided low back pain, unspecified whether sciatica present Less pain in right lower back but occasional decrease in sensation of the lateral thigh. X-rays on 09-11-18 showed a 4 mm anterolisthesis of L5 on S1 with pars defects at L5 bilaterally. Only some soreness today. Rarely using Ibuprofen 800 mg TID with Percogesic prn muscle spasms. May use topical Icy-Hot with Lidocaine or moist heat applications with TENS unit. May use lumbar supporter for any strenuous  activities. Schedule PT evaluation and treatment. May need MRI and/or spine specialist referral if worsening or more neurologic symptoms. Follow up prn. - Ambulatory referral to Physical Therapy  2. Pars defect with spondylolisthesis Bilateral at L5 with 4 mm anterolisthesis of L5 on S1 per x-ray of 09-11-18. Schedule PT evaluation and treatment. - Ambulatory referral to Physical Therapy  3. Plantar wart of left foot Has had two warts on the left foot for many years. Has tried treatments with liquid nitrogen, acids, podiatry referrals with excisions, etc. Continues to recur and be painful. Requests referral to a dermatologist for more extensive treatment. - Ambulatory referral to Dermatology    Dortha Kern, PA  [facility]

## 2018-10-06 ENCOUNTER — Ambulatory Visit (INDEPENDENT_AMBULATORY_CARE_PROVIDER_SITE_OTHER): Payer: BLUE CROSS/BLUE SHIELD | Admitting: Obstetrics & Gynecology

## 2018-10-06 ENCOUNTER — Encounter: Payer: Self-pay | Admitting: Family Medicine

## 2018-10-06 ENCOUNTER — Other Ambulatory Visit (HOSPITAL_COMMUNITY)
Admission: RE | Admit: 2018-10-06 | Discharge: 2018-10-06 | Disposition: A | Discharge status: Home / Self Care | Payer: BLUE CROSS/BLUE SHIELD | Source: Ambulatory Visit | Attending: Obstetrics & Gynecology | Admitting: Obstetrics & Gynecology

## 2018-10-06 ENCOUNTER — Encounter: Payer: Self-pay | Admitting: Obstetrics & Gynecology

## 2018-10-06 ENCOUNTER — Ambulatory Visit: Payer: BLUE CROSS/BLUE SHIELD | Admitting: Family Medicine

## 2018-10-06 VITALS — BP 100/54 | HR 75 | Temp 98.3°F | Resp 16 | Wt 172.0 lb

## 2018-10-06 VITALS — BP 120/80 | Ht 65.0 in | Wt 172.0 lb

## 2018-10-06 DIAGNOSIS — G8929 Other chronic pain: Secondary | ICD-10-CM

## 2018-10-06 DIAGNOSIS — Z30430 Encounter for insertion of intrauterine contraceptive device: Secondary | ICD-10-CM

## 2018-10-06 DIAGNOSIS — Z01419 Encounter for gynecological examination (general) (routine) without abnormal findings: Secondary | ICD-10-CM

## 2018-10-06 DIAGNOSIS — M545 Low back pain: Secondary | ICD-10-CM | POA: Diagnosis not present

## 2018-10-06 DIAGNOSIS — M4300 Spondylolysis, site unspecified: Secondary | ICD-10-CM

## 2018-10-06 DIAGNOSIS — Z3049 Encounter for surveillance of other contraceptives: Secondary | ICD-10-CM | POA: Diagnosis not present

## 2018-10-06 DIAGNOSIS — Z124 Encounter for screening for malignant neoplasm of cervix: Secondary | ICD-10-CM | POA: Insufficient documentation

## 2018-10-06 DIAGNOSIS — B070 Plantar wart: Secondary | ICD-10-CM | POA: Diagnosis not present

## 2018-10-06 DIAGNOSIS — M4310 Spondylolisthesis, site unspecified: Secondary | ICD-10-CM

## 2018-10-06 DIAGNOSIS — Z3046 Encounter for surveillance of implantable subdermal contraceptive: Secondary | ICD-10-CM

## 2018-10-06 NOTE — Progress Notes (Signed)
HPI:      Ms. April Harris is a 28 y.o. G2P0020 who LMP was Patient's last menstrual period was 09/28/2018., she presents today for her annual examination. The patient has no complaints today. The patient is sexually active. Her last pap: approximate date 2018 and was normal. The patient does perform self breast exams.  There is no notable family history of breast or ovarian cancer in her family.  The patient has regular exercise: yes.  The patient denies current symptoms of depression.    GYN History: Contraception: Nexplanon  PMHx: Past Medical History:  Diagnosis Date  . Asthma   . IBS (irritable bowel syndrome)   . LGSIL on Pap smear of cervix    Past Surgical History:  Procedure Laterality Date  . COLPOSCOPY    . DILATION AND CURETTAGE OF UTERUS    . INDUCED ABORTION    . TONSILLECTOMY     Family History  Problem Relation Age of Onset  . Cancer Maternal Grandfather   . Depression Mother   . Depression Maternal Grandmother   . Hypertension Paternal Grandmother    Social History   Tobacco Use  . Smoking status: Current Every Day Smoker  . Smokeless tobacco: Never Used  Substance Use Topics  . Alcohol use: Yes  . Drug use: No    Current Outpatient Medications:  .  Loratadine (CLARITIN) 10 MG CAPS, Take by mouth as needed., Disp: , Rfl:  .  etonogestrel (NEXPLANON) 68 MG IMPL implant, Inject into the skin., Disp: , Rfl:  .  nitrofurantoin, macrocrystal-monohydrate, (MACROBID) 100 MG capsule, Take 1 capsule (100 mg total) by mouth 2 (two) times daily. (Patient not taking: Reported on 09/11/2018), Disp: 8 capsule, Rfl: 0 Allergies: Penicillins  ROS  Objective: BP 120/80   Ht 5\' 5"  (1.651 m)   Wt 172 lb (78 kg)   LMP 09/28/2018   BMI 28.62 kg/m   Filed Weights   10/06/18 1518  Weight: 172 lb (78 kg)   Body mass index is 28.62 kg/m. OBGyn Exam  Assessment:  ANNUAL EXAM 1. Women's annual routine gynecological examination   2. Encounter for removal of  subdermal contraceptive implant   3. Encounter for insertion of mirena IUD   4. Screening for cervical cancer      Screening Plan:            1.  Cervical Screening-  Pap smear done today  2. Breast screening- Exam annually and mammogram>40 planned   3. Labs managed by PCP  4. Counseling for contraception: Plan change from Nexpolanon to Mirena due to bleeding and pain side effects to the Nexplanon in the third year of each use (this is her third).   5. Encounter for removal of subdermal contraceptive implant, see below  6. Encounter for insertion of mirena IUD See below   Nexplanon removal Procedure note - The Nexplanon was noted in the patient's arm and the end was identified. The skin was cleansed with a Betadine solution. A small injection of subcutaneous lidocaine with epinephrine was given over the end of the implant. An incision was made at the end of the implant. The rod was noted in the incision and grasped with a hemostat. It was noted to be intact.  Steri-Strip was placed approximating the incision. Hemostasis was noted.   IUD PROCEDURE NOTE:  April Harris is a 28 y.o. G2P0020 here for IUD insertion. No GYN concerns.  Last pap smear was normal.  IUD Insertion Procedure  Note Patient identified, informed consent performed, consent signed.   Discussed risks of irregular bleeding, cramping, infection, malpositioning or misplacement of the IUD outside the uterus which may require further procedure such as laparoscopy, risk of failure <1%. Time out was performed.  Urine pregnancy test negative.  A bimanual exam showed the uterus to be midposition.  Speculum placed in the vagina.  Cervix visualized.  Cleaned with Betadine x 2.  Grasped anteriorly with a single tooth tenaculum.  Uterus sounded to 7 cm.   IUD placed per manufacturer's recommendations.  Strings trimmed to 3 cm. Tenaculum was removed, good hemostasis noted.  Patient tolerated procedure well.   Patient was given  post-procedure instructions.  She was advised to have backup contraception for one week.  Patient was also asked to check IUD strings periodically and follow up in 4 weeks for IUD check.     F/U  Return in about 4 weeks (around 11/03/2018) for Follow up.  Annamarie Major, MD, [facility], [facility] 10/06/2018  4:07 PM

## 2018-10-06 NOTE — Telephone Encounter (Signed)
Patient is schedule for mirena insertion 11/04/18 with [facility]

## 2018-10-06 NOTE — Patient Instructions (Signed)
Intrauterine Device Insertion, Care After    This sheet gives you information about how to care for yourself after your procedure. Your health care provider may also give you more specific instructions. If you have problems or questions, contact your health care provider.  What can I expect after the procedure?  After the procedure, it is common to have:  · Cramps and pain in the abdomen.  · Light bleeding (spotting) or heavier bleeding that is like your menstrual period. This may last for up to a few days.  · Lower back pain.  · Dizziness.  · Headaches.  · Nausea.  Follow these instructions at home:  · Before resuming sexual activity, check to make sure that you can feel the IUD string(s). You should be able to feel the end of the string(s) below the opening of your cervix. If your IUD string is in place, you may resume sexual activity.  ? If you had a hormonal IUD inserted more than 7 days after your most recent period started, you will need to use a backup method of birth control for 7 days after IUD insertion. Ask your health care provider whether this applies to you.  · Continue to check that the IUD is still in place by feeling for the string(s) after every menstrual period, or once a month.  · Take over-the-counter and prescription medicines only as told by your health care provider.  · Do not drive or use heavy machinery while taking prescription pain medicine.  · Keep all follow-up visits as told by your health care provider. This is important.  Contact a health care provider if:  · You have bleeding that is heavier or lasts longer than a normal menstrual cycle.  · You have a fever.  · You have cramps or abdominal pain that get worse or do not get better with medicine.  · You develop abdominal pain that is new or is not in the same area of earlier cramping and pain.  · You feel lightheaded or weak.  · You have abnormal or bad-smelling discharge from your vagina.  · You have pain during sexual  activity.  · You have any of the following problems with your IUD string(s):  ? The string bothers or hurts you or your sexual partner.  ? You cannot feel the string.  ? The string has gotten longer.  · You can feel the IUD in your vagina.  · You think you may be pregnant, or you miss your menstrual period.  · You think you may have an STI (sexually transmitted infection).  Get help right away if:  · You have flu-like symptoms.  · You have a fever and chills.  · You can feel that your IUD has slipped out of place.  Summary  · After the procedure, it is common to have cramps and pain in the abdomen. It is also common to have light bleeding (spotting) or heavier bleeding that is like your menstrual period.  · Continue to check that the IUD is still in place by feeling for the string(s) after every menstrual period, or once a month.  · Keep all follow-up visits as told by your health care provider. This is important.  · Contact your health care provider if you have problems with your IUD string(s), such as the string getting longer or bothering you or your sexual partner.  This information is not intended to replace advice given to you by your health care provider. Make   sure you discuss any questions you have with your health care provider.  Document Released: 04/04/2011 Document Revised: 06/27/2016 Document Reviewed: 06/27/2016  [employer]vier Interactive Patient Education © 2019 [employer]

## 2018-10-08 LAB — CYTOLOGY - PAP: Diagnosis: NEGATIVE

## 2018-10-09 ENCOUNTER — Other Ambulatory Visit: Payer: Self-pay

## 2018-10-09 ENCOUNTER — Ambulatory Visit: Payer: BLUE CROSS/BLUE SHIELD | Attending: Family Medicine | Admitting: Physical Therapy

## 2018-10-09 ENCOUNTER — Encounter: Payer: Self-pay | Admitting: Physical Therapy

## 2018-10-09 ENCOUNTER — Ambulatory Visit: Payer: BLUE CROSS/BLUE SHIELD | Admitting: Physical Therapy

## 2018-10-09 DIAGNOSIS — G8929 Other chronic pain: Secondary | ICD-10-CM | POA: Diagnosis not present

## 2018-10-09 DIAGNOSIS — M545 Low back pain, unspecified: Secondary | ICD-10-CM

## 2018-10-09 NOTE — Therapy (Signed)
[place], [place], [address] Phone: [phone]   Fax:  [phone]  Physical Therapy Evaluation  Patient Details  Name: April Harris MRN: [id number] Date of Birth: December 15, 1990 Referring Provider (PT): Vernie Murders, [place]   Encounter Date: 10/09/2018  PT End of Session - 10/09/18 1327    Visit Number  1    Number of Visits  12    Date for PT Re-Evaluation  11/20/18    Authorization Type  BCBS-30 visit limit    PT Start Time  1329    PT Stop Time  1412    PT Time Calculation (min)  43 min       Past Medical History:  Diagnosis Date  . Asthma   . IBS (irritable bowel syndrome)   . LGSIL on Pap smear of cervix     Past Surgical History:  Procedure Laterality Date  . COLPOSCOPY    . DILATION AND CURETTAGE OF UTERUS    . INDUCED ABORTION    . TONSILLECTOMY      There were no vitals filed for this visit.   Subjective Assessment - 10/09/18 1323    Subjective  Pt. is a 28 y/o female referred to PT with c/o LBP. She reports 12-13 year history of intermittent symptoms, insidious onset without specific mechanusm of injury. Past tx. includes chiropractic care. Pt. reports new onset/exacerbation around 2-3 weeks ago. X-ray findings 09/11/18 of bilat. pars defect and L5-S1 spondylolisthesis.    Limitations  Sitting;House hold activities;Lifting    Diagnostic tests  X-rays    Patient Stated Goals  Resolve back pain    Currently in Pain?  Yes    Pain Score  2     Pain Location  Back    Pain Orientation  Lower    Pain Descriptors / Indicators  Aching    Pain Type  Chronic pain   acute exacerbation of chronic pain   Pain Radiating Towards  intermittent numbness into right thigh but on radiating symptoms today    Pain Onset  1 to 4 weeks ago   exacerbation 2-3 weeks ago of chronic pain   Pain Frequency  Intermittent    Aggravating Factors   sitting, variable with activity    Pain Relieving Factors   heat, ice, medication    Effect of Pain on Daily Activities  limits positional tolerance and ability ADLs/IADLs         [facility] PT Assessment - 10/09/18 0001      Assessment   Medical Diagnosis  Pars defect with spondylolisthesis    Referring Provider (PT)  Vernie Murders, PA    Onset Date/Surgical Date  09/18/18   estimated onset of new exacerbation of chronic symptoms   Hand Dominance  Right    Prior Therapy  none      Precautions   Precautions  None      Restrictions   Weight Bearing Restrictions  No      Balance Screen   Has the patient fallen in the past 6 months  No      Prior Function   Level of Independence  Independent with basic ADLs      Cognition   Overall Cognitive Status  Within Functional Limits for tasks assessed      Observation/Other Assessments   Focus on Therapeutic Outcomes (FOTO)   37% limited      Sensation   Light Touch  --   grossly intact bilat. L2-S2  dermatomes     ROM / Strength   AROM / PROM / Strength  AROM;PROM;Strength      AROM   Overall AROM Comments  Bilat. hip AROM/PROM grossly WFL, increased LBP with extension and left sidebending    AROM Assessment Site  Lumbar    Lumbar Flexion  100    Lumbar Extension  30    Lumbar - Right Side Bend  40    Lumbar - Left Side Bend  40    Lumbar - Right Rotation  WFL    Lumbar - Left Rotation  WFL      PROM   Overall PROM Comments  no significant limitations noted, mild hamstring tightness on right      Strength   Overall Strength Comments  Bilat. LE MMTs grossly 5/5 excepting right hip abduction 4+/5    Strength Assessment Site  Hip;Knee;Ankle      Palpation   SI assessment   --   no innominate rotation noted   Palpation comment  Mild TTP right QL and SI region      Special Tests   Other special tests  SLR (-), SI testing series with FABRER, thigh thrust and compression, distraction (-)                Objective measurements completed on examination: See above findings.       [place] Adult PT Treatment/Exercise - 10/09/18 0001      Exercises   Exercises  Lumbar      Lumbar Exercises: Stretches   Active Hamstring Stretch  --   brief HEP instruction   Single Knee to Chest Stretch Limitations  instructed HEP SKTC vs. DKTC options      Lumbar Exercises: Supine   Pelvic Tilt  5 reps    Pelvic Tilt Limitations  instructed HEP    Bent Knee Raise  --   instructed HEP with cues for pelvic tilt   Isometric Hip Flexion  --   instructed HEP     Manual Therapy   Manual Therapy  Muscle Energy Technique    Muscle Energy Technique  "shotgun" MET 5 x 5 sec ea. with instruction HEP self-variation             PT Education - 10/09/18 1418    Education Details  HEP, potential symptom etiology, plan of care, movements to avoid    Person(s) Educated  Patient    Methods  Explanation;Demonstration;Tactile cues;Verbal cues;Handout    Comprehension  Returned demonstration;Verbalized understanding          PT Long Term Goals - 10/09/18 1436      PT LONG TERM GOAL #1   Title  Improve FOTO score to 26% or less impairment    Baseline  37% limited    Time  6    Period  Weeks    Status  New    Target Date  11/20/18      PT LONG TERM GOAL #2   Title  Independent with HEP    Baseline  needs HEP, instructed at eval    Time  6    Period  Weeks    Status  New    Target Date  11/20/18      PT LONG TERM GOAL #3   Title  Right hip abd strength 5/5 for improved stability to decrease LBP with chores    Baseline  4+/5    Time  6    Period  Weeks  Status  New    Target Date  11/20/18      PT LONG TERM GOAL #4   Title  Tolerate sitting at work periods 40 min or greater without limitation due to LBP    Baseline  Reports difficulty/pain with prolonged sitting    Time  6    Period  Weeks    Status  New    Target Date  11/20/18             Plan - 10/09/18 1430    Clinical Impression Statement  Pt. presents with acute on chronic right sided LBP  with flexion bias for trunk ROM consistent with referring dx. and imaging findings of pars defect and spondylolisthesis. Symptoms extend into right SI region-test item cluster for SI dysfunction negative, suspect some portion of symptoms may be myofascial vs. mild radicular component. Pt. would benefit from PT to help reliev pain and address associated functional limitations.    History and Personal Factors relevant to plan of care:  12-13 year history chronic LBP, imaging findings    Clinical Presentation  Stable    Clinical Decision Making  Low    Rehab Potential  Good    PT Frequency  --   1-2x/week   PT Duration  6 weeks    PT Treatment/Interventions  ADLs/Self Care Home Management;Electrical Stimulation;Cryotherapy;Ultrasound;Moist Heat;Therapeutic activities;Therapeutic exercise;Neuromuscular re-education;Manual techniques;Dry needling;Taping;Patient/family education    PT Next Visit Plan  review HEP as needed, progress flexion bias core stabilization, add standing stabilization as tolerated, STM right lumbar region and QL, potential trial dry needling right QL and lumbar paraspinals    PT Home Exercise Plan  pelvic tilt, SKTC vs. DKTC, bent knee raises/dead bug progression, alt. hip flexion isometrics, hamstring stretches    Consulted and Agree with Plan of Care  Patient       Patient will benefit from skilled therapeutic intervention in order to improve the following deficits and impairments:  Pain, Decreased strength, Decreased activity tolerance, Impaired flexibility  Visit Diagnosis: Chronic right-sided low back pain, unspecified whether sciatica present     Problem List Patient Active Problem List   Diagnosis Date Noted  . Abortion 11/12/2017  . Abortion, spontaneous 11/12/2017  . Hypercholesterolemia 11/12/2017  . Menorrhagia with regular cycle 11/07/2017  . 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 Asthma 04/21/2009  . Current tobacco use 04/21/2009    Beaulah Dinning, PT,  DPT 10/09/18 2:38 PM  [facility] [address] [place], [place], [address] Phone: [phone]   Fax:  [phone]  Name: April Harris MRN: [id number] Date of Birth: 1991-02-10

## 2018-10-16 ENCOUNTER — Ambulatory Visit: Payer: BLUE CROSS/BLUE SHIELD | Admitting: Physical Therapy

## 2018-10-16 ENCOUNTER — Encounter: Payer: Self-pay | Admitting: Physical Therapy

## 2018-10-16 DIAGNOSIS — G8929 Other chronic pain: Secondary | ICD-10-CM

## 2018-10-16 DIAGNOSIS — M545 Low back pain: Principal | ICD-10-CM

## 2018-10-16 NOTE — Therapy (Signed)
[place], [place], [address] Phone: [phone]   Fax:  [phone]  Physical Therapy Treatment  Patient Details  Name: April Harris MRN: [id number] Date of Birth: 12-15-1990 Referring Provider (PT): Vernie Murders, [place]   Encounter Date: 10/16/2018  PT End of Session - 10/16/18 1331    Visit Number  2    Number of Visits  12    Date for PT Re-Evaluation  11/20/18    Authorization Type  BCBS-30 visit limit    PT Start Time  1330    PT Stop Time  1422    PT Time Calculation (min)  52 min    Activity Tolerance  Patient tolerated treatment well    Behavior During Therapy  [facility] for tasks assessed/performed       Past Medical History:  Diagnosis Date  . Asthma   . IBS (irritable bowel syndrome)   . LGSIL on Pap smear of cervix     Past Surgical History:  Procedure Laterality Date  . COLPOSCOPY    . DILATION AND CURETTAGE OF UTERUS    . INDUCED ABORTION    . TONSILLECTOMY      There were no vitals filed for this visit.  Subjective Assessment - 10/16/18 1335    Subjective  Pt reports feeling better after the first visit.     Limitations  Sitting;House hold activities;Lifting    Diagnostic tests  X-rays    Patient Stated Goals  Resolve back pain    Currently in Pain?  Yes    Pain Score  2    not quite as intense as initial eval 2/10   Pain Location  Back    Pain Orientation  Lower;Right    Pain Descriptors / Indicators  Aching    Pain Type  Chronic pain    Pain Onset  1 to 4 weeks ago    Pain Frequency  Intermittent                       [facility] Adult PT Treatment/Exercise - 10/16/18 1352      Exercises   Exercises  Lumbar      Lumbar Exercises: Stretches   Active Hamstring Stretch  3 reps;30 seconds    Active Hamstring Stretch Limitations  use of stretch out strap.    Single Knee to Chest Stretch  5 reps;Left;Right;10 seconds    Other Lumbar Stretch Exercise   childs pose 2  forward , 2 to left and 2 to right stretch for 15 sec.      Lumbar Exercises: Supine   Pelvic Tilt  10 reps   hold for 3 secs   Bent Knee Raise  10 reps;3 seconds   right and left each   Bent Knee Raise Limitations  VC and TC for drawing in abs while performing ex.     Isometric Hip Flexion  5 reps      Lumbar Exercises: Quadruped   Straight Leg Raise  5 reps;3 seconds    Straight Leg Raises Limitations  right and left, VC for keeping core quiet , no rotation of torso      Modalities   Modalities  Moist Heat      Moist Heat Therapy   Number Minutes Moist Heat  15 Minutes    Moist Heat Location  Lumbar Spine   over Right PSIS     Manual Therapy   Manual Therapy  Soft tissue mobilization  Manual therapy comments  skilled palpation with TPDN    Soft tissue mobilization  IASTYM tool to right SI. lumbo sacral area, Gluteals along iliac crest and left lumbar paraspinals   pelvic level even      Trigger Point Dry Needling - 10/16/18 1355    Consent Given?  Yes    Education Handout Provided  Yes    Muscles Treated Lower Body  Gluteus maximus   R PSIS/ SI joint line .30 60 mm L4/5 left   Gluteus Maximus Response  Twitch response elicited;Palpable increased muscle length           PT Education - 10/16/18 1340    Education Details  educated on TPDN and aftercare and precautians  reviewed HEP    Person(s) Educated  Patient    Methods  Explanation;Demonstration    Comprehension  Verbalized understanding;Returned demonstration          PT Long Term Goals - 10/09/18 1436      PT LONG TERM GOAL #1   Title  Improve FOTO score to 26% or less impairment    Baseline  37% limited    Time  6    Period  Weeks    Status  New    Target Date  11/20/18      PT LONG TERM GOAL #2   Title  Independent with HEP    Baseline  needs HEP, instructed at eval    Time  6    Period  Weeks    Status  New    Target Date  11/20/18      PT LONG TERM GOAL #3   Title   Right hip abd strength 5/5 for improved stability to decrease LBP with chores    Baseline  4+/5    Time  6    Period  Weeks    Status  New    Target Date  11/20/18      PT LONG TERM GOAL #4   Title  Tolerate sitting at work periods 40 min or greater without limitation due to LBP    Baseline  Reports difficulty/pain with prolonged sitting    Time  6    Period  Weeks    Status  New    Target Date  11/20/18            Plan - 10/16/18 1409    Clinical Impression Statement  Pt with 1-2/10 entering clinic but feels like she can move better and knows the exericses helped.  Pt with even pelvic levels and did not need MET this visit.  Pt consented to TPDN and was closely monitored throughout session.  Pt is a 1/10 at end of session with heat and TPDN and review of exericise.      Rehab Potential  Good    PT Frequency  --   1-2 x /week   PT Duration  6 weeks    PT Treatment/Interventions  ADLs/Self Care Home Management;Electrical Stimulation;Cryotherapy;Ultrasound;Moist Heat;Therapeutic activities;Therapeutic exercise;Neuromuscular re-education;Manual techniques;Dry needling;Taping;Patient/family education    PT Next Visit Plan  review HEP as needed, progress flexion bias core stabilization, add standing stabilization as tolerated, STM right lumbar region and QL, assess TPDN last visit    PT Home Exercise Plan  pelvic tilt, SKTC vs. DKTC, bent knee raises/dead bug progression, alt. hip flexion isometrics, hamstring stretches    Consulted and Agree with Plan of Care  Patient       Patient will benefit from skilled therapeutic intervention  in order to improve the following deficits and impairments:  Pain, Decreased strength, Decreased activity tolerance, Impaired flexibility  Visit Diagnosis: Chronic right-sided low back pain, unspecified whether sciatica present     Problem List Patient Active Problem List   Diagnosis Date Noted  . Abortion 11/12/2017  . Abortion, spontaneous  11/12/2017  . Hypercholesterolemia 11/12/2017  . Menorrhagia with regular cycle 11/07/2017  . 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 Asthma 04/21/2009  . Current tobacco use 04/21/2009   Voncille Lo, PT Certified Exercise Expert for the Aging Adult  10/16/18 2:19 PM Phone: [phone] Fax: [facility] [address] [place], [place], [address] Phone: [phone]   Fax:  [phone]  Name: April Harris MRN: [id number] Date of Birth: Apr 20, 1991

## 2018-10-16 NOTE — Patient Instructions (Signed)
Trigger Point Dry Needling  . What is Trigger Point Dry Needling (DN)? o DN is a physical therapy technique used to treat muscle pain and dysfunction. Specifically, DN helps deactivate muscle trigger points (muscle knots).  o A thin filiform needle is used to penetrate the skin and stimulate the underlying trigger point. The goal is for a local twitch response (LTR) to occur and for the trigger point to relax. No medication of any kind is injected during the procedure.   . What Does Trigger Point Dry Needling Feel Like?  o The procedure feels different for each individual patient. Some patients report that they do not actually feel the needle enter the skin and overall the process is not painful. Very mild bleeding may occur. However, many patients feel a deep cramping in the muscle in which the needle was inserted. This is the local twitch response.   Marland Kitchen How Will I feel after the treatment? o Soreness is normal, and the onset of soreness may not occur for a few hours. Typically this soreness does not last longer than two days.  o Bruising is uncommon, however; ice can be used to decrease any possible bruising.  o In rare cases feeling tired or nauseous after the treatment is normal. In addition, your symptoms may get worse before they get better, this period will typically not last longer than 24 hours.   . What Can I do After My Treatment? o Increase your hydration by drinking more water for the next 24 hours. o You may place ice or heat on the areas treated that have become sore, however, do not use heat on inflamed or bruised areas. Heat often brings more relief post needling. o You can continue your regular activities, but vigorous activity is not recommended initially after the treatment for 24 hours. o DN is best combined with other physical therapy such as strengthening, stretching, and other therapies.    April Harris, PT Certified Exercise Expert for the Aging Adult  10/16/18 1:39  PM Phone: [phone] Fax: [phone]

## 2018-10-21 ENCOUNTER — Ambulatory Visit: Payer: BLUE CROSS/BLUE SHIELD | Attending: Family Medicine | Admitting: Physical Therapy

## 2018-10-21 DIAGNOSIS — G8929 Other chronic pain: Secondary | ICD-10-CM | POA: Diagnosis not present

## 2018-10-21 DIAGNOSIS — M545 Low back pain, unspecified: Secondary | ICD-10-CM

## 2018-10-21 NOTE — Therapy (Addendum)
[place], [place], [address] Phone: [phone]   Fax:  [phone]  Physical Therapy Treatment/Discharge Note  Patient Details  Name: April Harris MRN: [id number] Date of Birth: 26-Feb-1991 Referring Provider (PT): Vernie Murders, [place]   Encounter Date: 10/21/2018  PT End of Session - 10/21/18 0808    Visit Number  3    Number of Visits  12    Date for PT Re-Evaluation  11/20/18    Authorization Type  BCBS-30 visit limit    PT Start Time  0801    PT Stop Time  0844    PT Time Calculation (min)  43 min    Activity Tolerance  Patient tolerated treatment well    Behavior During Therapy  [facility] for tasks assessed/performed       Past Medical History:  Diagnosis Date  . Asthma   . IBS (irritable bowel syndrome)   . LGSIL on Pap smear of cervix     Past Surgical History:  Procedure Laterality Date  . COLPOSCOPY    . DILATION AND CURETTAGE OF UTERUS    . INDUCED ABORTION    . TONSILLECTOMY      There were no vitals filed for this visit.  Subjective Assessment - 10/21/18 0808    Subjective  I am a 1/10 . the dry needling was good. 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 gardening, hoeing , raking and digging holes    Limitations  Sitting;House hold activities;Lifting    Diagnostic tests  X-rays    Patient Stated Goals  Resolve back pain    Currently in Pain?  Yes    Pain Score  1     Pain Location  Back    Pain Orientation  Lower;Right    Pain Descriptors / Indicators  Aching    Pain Type  Chronic pain    Pain Onset  More than a month ago    Pain Frequency  Intermittent         [facility] PT Assessment - 10/21/18 0001      Strength   Overall Strength Comments  all 5/5 except right hip abduction 4+/5                   [facility] Adult PT Treatment/Exercise - 10/21/18 0842      Self-Care   Self-Care   Lifting;Posture;ADL's    ADL's  educated on lifting. 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    Single Knee to Chest Stretch  5 reps;Left;Right;10 seconds    Piriformis Stretch  2 reps;Left;Right;30 seconds   total 4 stretch   Other Lumbar Stretch Exercise  childs pose 2  forward , 2 to left and 2 to right stretch for 15 sec.    Other Lumbar Stretch Exercise  figure 4 stretch left and right 2 x 30 sec in supine,  hip flexor stretch in supine with quad stretch right side 30 sec x 2       Lumbar Exercises: Standing   Other Standing Lumbar Exercises  hip hinge with dowel 15 x 2    5 x against wall for External cue     Lumbar Exercises: Seated   Other Seated Lumbar Exercises  sink squat 15 x 2     vc and TC     Lumbar Exercises: Supine   Pelvic Tilt  10 reps   hold for 3 secs   Bridge with [employer]  15 reps;3 seconds    Bridge with Ball Squeeze Limitations  x 2      Knee/Hip Exercises: Stretches   Hip Flexor Stretch  2 reps;30 seconds   right   Hip Flexor Stretch Limitations  encourage posterior tilt with stretch             PT Education - 10/21/18 0848    Education Details  added to HEP and posture/ body mechanics educaton    Person(s) Educated  Patient    Methods  Explanation;Demonstration;Tactile cues;Verbal cues;Handout    Comprehension  Returned demonstration;Verbalized understanding          PT Long Term Goals - 10/21/18 0836      PT LONG TERM GOAL #1   Title  Improve FOTO score to 26% or less impairment    Baseline  37% limited    Time  6    Period  Weeks    Status  Unable to assess      PT LONG TERM GOAL #2   Title  Independent with HEP    Baseline  Pt given inital HEP and posture/ body mechanics     Time  6    Period  Weeks    Status  On-going      PT LONG TERM GOAL #3   Title  Right hip abd strength 5/5 for improved stability to decrease LBP with chores    Baseline  4+/5    Time  6    Period  Weeks    Status  On-going      PT LONG TERM GOAL #4   Title  Tolerate sitting at work periods 40 min or greater without limitation due to LBP    Baseline  I can sit for 1 hour    Time  6    Period  Weeks    Status  Achieved            Plan - 10/21/18 1246    Clinical Impression Statement  Ms. Pellow declined TPDN and is a 1/10 pain level.  Pt progressing with strengtheniing.  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  Pt is on her way and would benefit from continue lumbar /sacral stabilzation in standing.  Pt achieved LTG # 4 and is able to sit for at least an hour   Rehab Potential  Good    PT Frequency  --   1-2 x a week   PT Duration  6 weeks    PT Treatment/Interventions  ADLs/Self Care Home Management;Electrical Stimulation;Cryotherapy;Ultrasound;Moist Heat;Therapeutic activities;Therapeutic exercise;Neuromuscular re-education;Manual techniques;Dry needling;Taping;Patient/family education    PT Next Visit Plan  standing stabilzation and ability to carry heavier items to work on acreage/clearing brush and carrying boulder.  review hip hinge. Maybe deadlifting as able    PT Home Exercise Plan  pelvic tilt, SKTC vs. DKTC, bent knee raises/dead bug progression, alt. hip flexion isometrics, hamstring stretches, hip hinge etc    Consulted and Agree with Plan  of Care  Patient       Patient will benefit from skilled therapeutic intervention in order to improve the following deficits and impairments:  Pain, Decreased strength, Decreased activity tolerance, Impaired flexibility  Visit Diagnosis: Chronic right-sided low back pain, unspecified whether sciatica  present     Problem List Patient Active Problem List   Diagnosis Date Noted  . Abortion 11/12/2017  . Abortion, spontaneous 11/12/2017  . Hypercholesterolemia 11/12/2017  . Menorrhagia with regular cycle 11/07/2017  . 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 Asthma 04/21/2009  . Current tobacco use 04/21/2009    Voncille Lo, PT Certified Exercise Expert for the Aging Adult  10/21/18 1:21 PM Phone: [phone] Fax: [facility] [place] [address] [place], [place], [address] Phone: [phone]   Fax:  [phone]  Name: April Harris MRN: [id number] Date of Birth: 1991/04/17  PHYSICAL THERAPY DISCHARGE SUMMARY  Visits from Start of Care: 3  Current functional level related to goals / functional outcomes: unknown   Remaining deficits: unknown   Education / Equipment: HEP Plan: Patient agrees to discharge.  Patient goals were partially met. Patient is being discharged due to not returning since the last visit.  ?????   Voncille Lo, PT Certified Exercise Expert for the Aging Adult  06/02/19 4:34 PM Phone: [phone] Fax: [phone]

## 2018-10-21 NOTE — Patient Instructions (Addendum)

## 2018-11-03 ENCOUNTER — Ambulatory Visit: Payer: BLUE CROSS/BLUE SHIELD | Admitting: Obstetrics & Gynecology

## 2018-11-06 ENCOUNTER — Encounter: Payer: BLUE CROSS/BLUE SHIELD | Admitting: Physical Therapy

## 2018-11-17 NOTE — Telephone Encounter (Signed)
Per Epic. Pt cancelled 11/03/2018

## 2018-11-20 DIAGNOSIS — D2270 Melanocytic nevi of unspecified lower limb, including hip: Secondary | ICD-10-CM | POA: Diagnosis not present

## 2018-11-20 DIAGNOSIS — B078 Other viral warts: Secondary | ICD-10-CM | POA: Diagnosis not present

## 2018-11-20 DIAGNOSIS — L814 Other melanin hyperpigmentation: Secondary | ICD-10-CM | POA: Diagnosis not present

## 2018-11-20 DIAGNOSIS — Z808 Family history of malignant neoplasm of other organs or systems: Secondary | ICD-10-CM | POA: Diagnosis not present

## 2018-11-20 DIAGNOSIS — D485 Neoplasm of uncertain behavior of skin: Secondary | ICD-10-CM | POA: Diagnosis not present

## 2018-11-20 DIAGNOSIS — L918 Other hypertrophic disorders of the skin: Secondary | ICD-10-CM | POA: Diagnosis not present

## 2019-01-01 DIAGNOSIS — D229 Melanocytic nevi, unspecified: Secondary | ICD-10-CM | POA: Diagnosis not present

## 2019-01-01 DIAGNOSIS — Z1283 Encounter for screening for malignant neoplasm of skin: Secondary | ICD-10-CM | POA: Diagnosis not present

## 2019-01-01 DIAGNOSIS — L812 Freckles: Secondary | ICD-10-CM | POA: Diagnosis not present

## 2019-01-01 DIAGNOSIS — B078 Other viral warts: Secondary | ICD-10-CM | POA: Diagnosis not present

## 2019-01-14 ENCOUNTER — Other Ambulatory Visit: Payer: Self-pay

## 2019-01-16 ENCOUNTER — Encounter: Payer: Self-pay | Admitting: Family Medicine

## 2019-01-16 ENCOUNTER — Other Ambulatory Visit: Payer: Self-pay

## 2019-01-16 ENCOUNTER — Ambulatory Visit (INDEPENDENT_AMBULATORY_CARE_PROVIDER_SITE_OTHER): Payer: BLUE CROSS/BLUE SHIELD | Admitting: Family Medicine

## 2019-01-16 DIAGNOSIS — F418 Other specified anxiety disorders: Secondary | ICD-10-CM

## 2019-01-16 MED ORDER — SERTRALINE HCL 50 MG PO TABS: 50.0000 mg | ORAL_TABLET | Freq: Every day | ORAL | 3 refills | Status: DC

## 2019-01-16 NOTE — Progress Notes (Signed)
Virtual Visit via Video Note  I connected with April Harris on 01/16/19 at  8:20 AM EDT by a video enabled telemedicine application and verified that I am speaking with the correct person using two identifiers.  Location: Patient: Home Provider: Office   I discussed the limitations of evaluation and management by telemedicine and the availability of in person appointments. The patient expressed understanding and agreed to proceed.  History of Present Illness: This 28 year old female has had depressed mood, anxiety, poor sleep, change of IBS-D to IBS-C over the past couple weeks. Working at home and on a computer 8 hours a day. No very active, eats 2 meals a day with a lot of snacks and admits to self-medicating with ETOH recently. Near tears during interview and requesting a "low dose antidepressant" treatment.   Observations/Objective: Past Medical History:  Diagnosis Date  . Asthma   . IBS (irritable bowel syndrome)   . LGSIL on Pap smear of cervix    Past Surgical History:  Procedure Laterality Date  . COLPOSCOPY    . DILATION AND CURETTAGE OF UTERUS    . INDUCED ABORTION    . TONSILLECTOMY     Family History  Problem Relation Age of Onset  . Cancer Maternal Grandfather   . Depression Mother   . Depression Maternal Grandmother   . Hypertension Paternal Grandmother    Social History   Tobacco Use  . Smoking status: Current Every Day Smoker  . Smokeless tobacco: Never Used  Substance Use Topics  . Alcohol use: Yes  . Drug use: No   Allergies  Allergen Reactions  . Penicillins    Current Outpatient Medications on File Prior to Visit  Medication Sig Dispense Refill  . levonorgestrel (MIRENA) 20 MCG/24HR IUD 1 each by Intrauterine route once.    . Loratadine (CLARITIN) 10 MG CAPS Take by mouth as needed.     No current facility-administered medications on file prior to visit.    WDWN female in no apparent distress.  Head: Normocephalic, atraumatic. Neck:  Supple, NROM Respiratory: No apparent distress Psych: Sad and near tears during interview. Denies suicidal ideation. Anxious and not sleeping well - awake frequently.   Assessment and Plan: 1. Depression with anxiety Onset over the past couple weeks with sleep disturbance , anxiety and depressed mood with poor motivation. Recommend she exercise 30-40 minutes daily and will start Sertraline 50 mg at bedtime. Limit any alcohol intake to no more than 1-2 drinks a day. Should schedule an appointment or virtual visit in 1-2 weeks. Sooner, if needed. Patient understands and agrees with plan. - sertraline (ZOLOFT) 50 MG tablet; Take 1 tablet (50 mg total) by mouth at bedtime.  Dispense: 30 tablet; Refill: 3   Follow Up Instructions:    I discussed the assessment and treatment plan with the patient. The patient was provided an opportunity to ask questions and all were answered. The patient agreed with the plan and demonstrated an understanding of the instructions.   The patient was advised to call back or seek an in-person evaluation if the symptoms worsen or if the condition fails to improve as anticipated.  I provided 15 minutes of non-face-to-face time during this encounter.   Dortha Kern, PA

## 2019-02-05 DIAGNOSIS — B078 Other viral warts: Secondary | ICD-10-CM | POA: Diagnosis not present

## 2019-02-07 ENCOUNTER — Other Ambulatory Visit: Payer: Self-pay | Admitting: Family Medicine

## 2019-02-07 DIAGNOSIS — F418 Other specified anxiety disorders: Secondary | ICD-10-CM

## 2019-05-06 DIAGNOSIS — B079 Viral wart, unspecified: Secondary | ICD-10-CM | POA: Diagnosis not present

## 2019-05-06 DIAGNOSIS — L918 Other hypertrophic disorders of the skin: Secondary | ICD-10-CM | POA: Diagnosis not present

## 2019-05-27 ENCOUNTER — Other Ambulatory Visit: Payer: Self-pay

## 2019-05-27 ENCOUNTER — Encounter: Payer: Self-pay | Admitting: Family Medicine

## 2019-05-27 ENCOUNTER — Ambulatory Visit (INDEPENDENT_AMBULATORY_CARE_PROVIDER_SITE_OTHER): Payer: BC Managed Care – PPO | Admitting: Family Medicine

## 2019-05-27 VITALS — BP 122/75 | HR 76 | Ht 65.25 in | Wt 153.0 lb

## 2019-05-27 DIAGNOSIS — M4310 Spondylolisthesis, site unspecified: Secondary | ICD-10-CM | POA: Diagnosis not present

## 2019-05-27 DIAGNOSIS — K589 Irritable bowel syndrome without diarrhea: Secondary | ICD-10-CM

## 2019-05-27 DIAGNOSIS — Z83438 Family history of other disorder of lipoprotein metabolism and other lipidemia: Secondary | ICD-10-CM

## 2019-05-27 DIAGNOSIS — Z720 Tobacco use: Secondary | ICD-10-CM

## 2019-05-27 DIAGNOSIS — G8929 Other chronic pain: Secondary | ICD-10-CM

## 2019-05-27 DIAGNOSIS — Z818 Family history of other mental and behavioral disorders: Secondary | ICD-10-CM

## 2019-05-27 DIAGNOSIS — B070 Plantar wart: Secondary | ICD-10-CM

## 2019-05-27 DIAGNOSIS — E663 Overweight: Secondary | ICD-10-CM

## 2019-05-27 DIAGNOSIS — Z23 Encounter for immunization: Secondary | ICD-10-CM

## 2019-05-27 DIAGNOSIS — Z813 Family history of other psychoactive substance abuse and dependence: Secondary | ICD-10-CM

## 2019-05-27 DIAGNOSIS — Z716 Tobacco abuse counseling: Secondary | ICD-10-CM

## 2019-05-27 DIAGNOSIS — M549 Dorsalgia, unspecified: Secondary | ICD-10-CM

## 2019-05-27 DIAGNOSIS — E7800 Pure hypercholesterolemia, unspecified: Secondary | ICD-10-CM

## 2019-05-27 DIAGNOSIS — Z7689 Persons encountering health services in other specified circumstances: Secondary | ICD-10-CM

## 2019-05-27 DIAGNOSIS — F1020 Alcohol dependence, uncomplicated: Secondary | ICD-10-CM | POA: Diagnosis not present

## 2019-05-27 DIAGNOSIS — Z808 Family history of malignant neoplasm of other organs or systems: Secondary | ICD-10-CM
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
## 2019-05-27 NOTE — Patient Instructions (Addendum)

## 2019-05-28 ENCOUNTER — Telehealth: Payer: Self-pay | Admitting: Family Medicine

## 2019-05-28 NOTE — Telephone Encounter (Signed)
Patient's prior provider [facility]) is also a part of [facility] and faxed back saying patient's medical records should be found in [place]. -- AM

## 2019-05-30 DIAGNOSIS — F1021 Alcohol dependence, in remission: Secondary | ICD-10-CM | POA: Insufficient documentation

## 2019-05-30 DIAGNOSIS — Z813 Family history of other psychoactive substance abuse and dependence: Secondary | ICD-10-CM | POA: Insufficient documentation

## 2019-05-30 DIAGNOSIS — E6609 Other obesity due to excess calories: Secondary | ICD-10-CM | POA: Insufficient documentation

## 2019-05-30 DIAGNOSIS — Z818 Family history of other mental and behavioral disorders: Secondary | ICD-10-CM | POA: Insufficient documentation

## 2019-05-30 DIAGNOSIS — E663 Overweight: Secondary | ICD-10-CM | POA: Insufficient documentation

## 2019-05-30 DIAGNOSIS — Z716 Tobacco abuse counseling: Secondary | ICD-10-CM | POA: Insufficient documentation

## 2019-05-30 DIAGNOSIS — F1020 Alcohol dependence, uncomplicated: Secondary | ICD-10-CM | POA: Insufficient documentation

## 2019-05-30 DIAGNOSIS — Z83438 Family history of other disorder of lipoprotein metabolism and other lipidemia: Secondary | ICD-10-CM | POA: Insufficient documentation

## 2019-05-30 DIAGNOSIS — B070 Plantar wart: Secondary | ICD-10-CM | POA: Insufficient documentation

## 2019-05-30 DIAGNOSIS — Z808 Family history of malignant neoplasm of other organs or systems: Secondary | ICD-10-CM | POA: Insufficient documentation

## 2019-05-30 DIAGNOSIS — G8929 Other chronic pain: Secondary | ICD-10-CM | POA: Insufficient documentation

## 2019-06-02 ENCOUNTER — Other Ambulatory Visit: Payer: Self-pay

## 2019-06-02 DIAGNOSIS — Z789 Other specified health status: Secondary | ICD-10-CM

## 2019-06-02 DIAGNOSIS — F1020 Alcohol dependence, uncomplicated: Secondary | ICD-10-CM

## 2019-06-02 DIAGNOSIS — E7800 Pure hypercholesterolemia, unspecified: Secondary | ICD-10-CM

## 2019-06-25 DIAGNOSIS — B079 Viral wart, unspecified: Secondary | ICD-10-CM | POA: Diagnosis not present

## 2019-07-22 ENCOUNTER — Other Ambulatory Visit: Payer: Self-pay

## 2019-07-22 ENCOUNTER — Encounter: Payer: Self-pay | Admitting: Family Medicine

## 2019-07-22 ENCOUNTER — Other Ambulatory Visit: Payer: BC Managed Care – PPO

## 2019-07-22 ENCOUNTER — Ambulatory Visit (INDEPENDENT_AMBULATORY_CARE_PROVIDER_SITE_OTHER): Payer: BC Managed Care – PPO | Admitting: Family Medicine

## 2019-07-22 VITALS — BP 107/76 | HR 84 | Temp 98.0°F | Resp 10 | Ht 65.0 in | Wt 159.5 lb

## 2019-07-22 DIAGNOSIS — Z719 Counseling, unspecified: Secondary | ICD-10-CM

## 2019-07-22 DIAGNOSIS — Z0000 Encounter for general adult medical examination without abnormal findings: Secondary | ICD-10-CM

## 2019-07-22 DIAGNOSIS — E7800 Pure hypercholesterolemia, unspecified: Secondary | ICD-10-CM

## 2019-07-22 DIAGNOSIS — Z789 Other specified health status: Secondary | ICD-10-CM

## 2019-07-22 DIAGNOSIS — B079 Viral wart, unspecified: Secondary | ICD-10-CM | POA: Diagnosis not present

## 2019-07-22 DIAGNOSIS — F1020 Alcohol dependence, uncomplicated: Secondary | ICD-10-CM

## 2019-07-22 NOTE — Progress Notes (Signed)
Impression and Recommendations:    1. Encounter for wellness examination   2. Health education/counseling      1) Anticipatory Guidance: Discussed importance of wearing a seatbelt while driving, not texting while driving; sunscreen when outside along with yearly skin surveillance; eating a well balanced and modest diet; physical activity at least 25 minutes per day or 150 min/ week of moderate to intense activity.  - Reviewed prudent self-breast screening habits with patient today.  - Discussed the A,B,C,D's of skin surveillance and encouraged patient to continue to follow up with dermatology.  - Encouraged patient to continue with sobriety goals/maintenance.  2) Immunizations / Screenings / Labs:   All immunizations and screenings that patient agrees to, are up-to-date per recommendations or will be updated today.  Patient understands the needs for q 62mo dental and yearly vision screens which pt will schedule independently. Obtain CBC, CMP, HgA1c, Lipid panel, TSH and vit D when fasting if not already done recently.   - Fasting lab work obtained today.  - Recently had pap smear with Dr. Tiburcio PeaHarris of [facility].  - TDAP up to date.  - Flu vaccine up to date.  - STI screen panel up to date. - Reviewed indications for low-risk Hep C/HIV screen.  - Patient will continue to follow up with OBGYN.  3) Weight:   Discussed goal of losing even 5-10% of current body weight which would improve overall feelings of well being and improve objective health data significantly.   Improve nutrient density of diet through increasing intake of fruits and vegetables and decreasing saturated/trans fats, white flour products and refined sugar products.   Explained to patient what BMI refers to, and what it means medically.    Told patient to think about it as a "medical risk stratification measurement" and how increasing BMI is associated with increasing risk/ or worsening state of various diseases such as  hypertension, hyperlipidemia, diabetes, premature OA, depression etc.  American Heart Association guidelines for healthy diet, basically Mediterranean diet, and exercise guidelines of 30 minutes 5 days per week or more discussed in detail.  Health counseling performed.  All questions answered.  4) Lifestyle & Preventative Health Maintenance - Advised patient to continue working toward exercising to improve overall mental, physical, and emotional health.    - Reviewed the "spokes of the wheel" of mood and health management.  Stressed the importance of ongoing prudent habits, including regular exercise, appropriate sleep hygiene, healthful dietary habits, and prayer/meditation to relax.  - Encouraged patient to engage in daily physical activity, especially a formal exercise routine.  Recommended that the patient eventually strive for at least 150 minutes of moderate cardiovascular activity per week according to guidelines established by the [facility].   - Healthy dietary habits encouraged, including low-carb, and high amounts of lean protein in diet.   - Patient should also consume adequate amounts of water.  Return for review lab work via [facility] next available per pt preference.  -Asked CMA to please add on hepatitis C to patient blood work today  Gross side effects, risk and benefits, and alternatives of medications discussed with patient.  Patient is aware that all medications have potential side effects and we are unable to predict every side effect or drug-drug interaction that may occur.  Expresses verbal understanding and consents to current therapy plan and treatment regimen.  F-up preventative CPE in 1 year- reminded pt again, this is in addition to any chronic care visits.    Please see orders  placed and AVS handed out to patient at the end of our visit for further patient instructions/ counseling done pertaining to today's office visit.  This document serves as a record of services  personally performed by Thomasene Lot, DO. It was created on her behalf by Peggye Fothergill, a trained medical scribe. The creation of this record is based on the scribe's personal observations and the provider's statements to them.   This case required medical decision making of at least moderate complexity. The above documentation has been reviewed to be accurate and was completed by Carlye Grippe, D.O.     Subjective:    I, Peggye Fothergill, am serving as scribe for Dr. Thomasene Lot.  Chief Complaint  Patient presents with   Annual Exam    HPI: KIMRA KANTOR is a 28 y.o. female who presents to [facility] today a yearly health maintenance exam.  Health Maintenance Summary Reviewed and updated, unless pt declines services.  Colonoscopy:  Had a colonoscopy around age 1 for evaluation of IBS. Denies family history of colon cancer. Tobacco History Reviewed:   Y; current every day smoker, -.25 ppd, 3.5 pack years.  Alcohol:  Is not drinking; is working on maintaining sobriety. Exercise Habits:  Continues going to physical therapy; doing a lot of outdoor activities.  Notes she's hiking on the weekends and walking a lot. STD concerns:  None. Drug Use:   None. Birth control method:  Monitored by OBGYN. Menses regular:  Monitored by OBGYN. Lumps or breast concerns:  None reported. Breast Cancer Family History:  None reported.  Says Thanksgiving was good.  Had a gathering including her aunt, uncle, cousin, brother and his girlfriend and sister.  They sat out in the front yard and no one went inside.  - Diet/Lifestyle Says her diet hasn't been the best over the last week, but she mainly cooks her own food.  States she tries to minimize her intake of refined flours, and consumes lots of veggies, whole foods.  - Female Health Recently had pap smear done with Dr. Tiburcio Pea. Had Gardasil when she was younger.  - Eye Health Denies eye  concerns. Thinks she had a dilated eye exam when she was 10-11.  - Dental Health Notes it's been a while since she has been to the dentist.  - Dermatological Health Sees dermatology; has had warts frozen.  Notes she's been treating a wart currently and may have it frozen again.  She has had skin tags removed in the past.    Immunization History  Administered Date(s) Administered   HPV Quadrivalent 04/19/2010, 06/27/2010, 11/01/2011   Influenza,inj,Quad PF,6+ Mos 09/11/2018, 05/27/2019   Tdap 04/19/2010, 10/28/2017    Health Maintenance  Topic Date Due   PAP SMEAR-Modifier  10/06/2021   TETANUS/TDAP  10/29/2027   INFLUENZA VACCINE  Completed   HIV Screening  Completed     Wt Readings from Last 3 Encounters:  07/22/19 159 lb 8 oz (72.3 kg)  05/27/19 153 lb (69.4 kg)  10/06/18 172 lb (78 kg)   BP Readings from Last 3 Encounters:  07/22/19 107/76  05/27/19 122/75  10/06/18 120/80   Pulse Readings from Last 3 Encounters:  07/22/19 84  05/27/19 76  10/06/18 75     Past Medical History:  Diagnosis Date   Asthma    IBS (irritable bowel syndrome)    LGSIL on Pap smear of cervix       Past Surgical History:  Procedure Laterality  Date   COLPOSCOPY     DILATION AND CURETTAGE OF UTERUS     INDUCED ABORTION     TONSILLECTOMY        Family History  Problem Relation Age of Onset   Cancer Maternal Grandfather    Heart attack Maternal Grandfather    Depression Mother    Hyperlipidemia Mother    Depression Maternal Grandmother    Hypertension Paternal Grandmother    Melanoma Paternal Grandfather       Social History   Substance and Sexual Activity  Drug Use No  ,   Social History   Substance and Sexual Activity  Alcohol Use Yes  ,   Social History   Tobacco Use  Smoking Status Current Every Day Smoker   Packs/day: 0.25   Years: 14.00   Pack years: 3.50   Types: Cigarettes   Start date: 08/20/2004  Smokeless Tobacco  Never Used  ,   Social History   Substance and Sexual Activity  Sexual Activity Yes   Birth control/protection: Implant    Current Outpatient Medications on File Prior to Visit  Medication Sig Dispense Refill   b complex vitamins tablet Take 1 tablet by mouth daily.     BIOTIN PO Take 5,000 mcg by mouth daily.     cetirizine (ZYRTEC) 5 MG tablet Take 5 mg by mouth daily.     levonorgestrel (MIRENA) 20 MCG/24HR IUD 1 each by Intrauterine route once.     MAGNESIUM CITRATE PO Take 150 mg by mouth daily.     Multiple Vitamin (MULTIVITAMIN) capsule Take 1 capsule by mouth daily.     Probiotic Product (PROBIOTIC DAILY PO) Take by mouth 2 (two) times daily.     sertraline (ZOLOFT) 50 MG tablet Take 1 tablet (50 mg total) by mouth at bedtime. 90 tablet 3   TURMERIC PO Take by mouth. 500-1000mg  daily     Valerian Root 530 MG CAPS Take by mouth daily.     VITAMIN D PO Take 50 mcg by mouth daily.     No current facility-administered medications on file prior to visit.     Allergies: Penicillins  Review of Systems: General:   Denies fever, chills, unexplained weight loss.  Optho/Auditory:   Denies visual changes, blurred vision/LOV Respiratory:   Denies SOB, DOE more than baseline levels.   Cardiovascular:   Denies chest pain, palpitations, new onset peripheral edema  Gastrointestinal:   Denies nausea, vomiting, diarrhea.  Genitourinary: Denies dysuria, freq/ urgency, flank pain or discharge from genitals.  Endocrine:     Denies hot or cold intolerance, polyuria, polydipsia. Musculoskeletal:   Denies unexplained myalgias, joint swelling, unexplained arthralgias, gait problems.  Skin:  Denies rash, suspicious lesions Neurological:     Denies dizziness, unexplained weakness, numbness  Psychiatric/Behavioral:   Denies mood changes, suicidal or homicidal ideations, hallucinations    Objective:    Blood pressure 107/76, pulse 84, temperature 98 F (36.7 C), temperature  source Oral, resp. rate 10, height 5\' 5"  (1.651 m), weight 159 lb 8 oz (72.3 kg), SpO2 97 %. Body mass index is 26.54 kg/m. General Appearance:    Alert, cooperative, no distress, appears stated age  Head:    Normocephalic, without obvious abnormality, atraumatic  Eyes:    PERRL, conjunctiva/corneas clear, EOM's intact, fundi    benign, both eyes  Ears:    Normal TM's and external ear canals, both ears  Nose:   Nares normal, septum midline, mucosa normal, no drainage    or  sinus tenderness  Throat:   Lips w/o lesion, mucosa moist, and tongue normal; teeth and   gums normal  Neck:   Supple, symmetrical, trachea midline, no adenopathy;    thyroid:  no enlargement/tenderness/nodules; no carotid   bruit or JVD  Back:     Symmetric, no curvature, ROM normal, no CVA tenderness  Lungs:     Clear to auscultation bilaterally, respirations unlabored, no       Wh/ R/ R  Chest Wall:    No tenderness or gross deformity; normal excursion   Heart:    Regular rate and rhythm, S1 and S2 normal, no murmur, rub   or gallop  Breast Exam:    Deferred to OBGYN.  Abdomen:     Soft, non-tender, bowel sounds active all four quadrants, NO   G/R/R, no masses, no organomegaly  Genitalia:   Deferred to OBGYN.  Rectal:   Deferred to OBGYN.  Extremities:   Extremities normal, atraumatic, no cyanosis or gross edema  Pulses:   2+ and symmetric all extremities  Skin:   Warm, dry, Skin color, texture, turgor normal, no obvious rashes or lesions Psych: No HI/SI, judgement and insight good, Euthymic mood. Full Affect.  Neurologic:   CNII-XII intact, normal strength, sensation and reflexes    Throughout

## 2019-07-22 NOTE — Patient Instructions (Signed)
Preventive Care for Adults, Female  A healthy lifestyle and preventive care can promote health and wellness. Preventive health guidelines for women include the following key practices.   A routine yearly physical is a good way to check with your health care provider about your health and preventive screening. It is a chance to share any concerns and updates on your health and to receive a thorough exam.   Visit your dentist for a routine exam and preventive care every 6 months. Brush your teeth twice a day and floss once a day. Good oral hygiene prevents tooth decay and gum disease.   The frequency of eye exams is based on your age, health, family medical history, use of contact lenses, and other factors. Follow your health care provider's recommendations for frequency of eye exams.   Eat a healthy diet. Foods like vegetables, fruits, whole grains, low-fat dairy products, and lean protein foods contain the nutrients you need without too many calories. Decrease your intake of foods high in solid fats, added sugars, and salt. Eat the right amount of calories for you.Get information about a proper diet from your health care provider, if necessary.   Regular physical exercise is one of the most important things you can do for your health. Most adults should get at least 150 minutes of moderate-intensity exercise (any activity that increases your heart rate and causes you to sweat) each week. In addition, most adults need muscle-strengthening exercises on 2 or more days a week.   Maintain a healthy weight. The body mass index (BMI) is a screening tool to identify possible weight problems. It provides an estimate of body fat based on height and weight. Your health care provider can find your BMI, and can help you achieve or maintain a healthy weight.For adults 20 years and older:   - A BMI below 18.5 is considered underweight.   - A BMI of 18.5 to 24.9 is normal.   - A BMI of 25 to 29.9 is  considered overweight.   - A BMI of 30 and above is considered obese.   Maintain normal blood lipids and cholesterol levels by exercising and minimizing your intake of trans and saturated fats.  Eat a balanced diet with plenty of fruit and vegetables. Blood tests for lipids and cholesterol should begin at age 65 and be repeated every 5 years minimum.  If your lipid or cholesterol levels are high, you are over 40, or you are at high risk for heart disease, you may need your cholesterol levels checked more frequently.Ongoing high lipid and cholesterol levels should be treated with medicines if diet and exercise are not working.   If you smoke, find out from your health care provider how to quit. If you do not use tobacco, do not start.   Lung cancer screening is recommended for adults aged 10-80 years who are at high risk for developing lung cancer because of a history of smoking. A yearly low-dose CT scan of the lungs is recommended for people who have at least a 30-pack-year history of smoking and are a current smoker or have quit within the past 15 years. A pack year of smoking is smoking an average of 1 pack of cigarettes a day for 1 year (for example: 1 pack a day for 30 years or 2 packs a day for 15 years). Yearly screening should continue until the smoker has stopped smoking for at least 15 years. Yearly screening should be stopped for people who develop a  health problem that would prevent them from having lung cancer treatment.   If you are pregnant, do not drink alcohol. If you are breastfeeding, be very cautious about drinking alcohol. If you are not pregnant and choose to drink alcohol, do not have more than 1 drink per day. One drink is considered to be 12 ounces (355 mL) of beer, 5 ounces (148 mL) of wine, or 1.5 ounces (44 mL) of liquor.   Avoid use of street drugs. Do not share needles with anyone. Ask for help if you need support or instructions about stopping the use of  drugs.   High blood pressure causes heart disease and increases the risk of stroke. Your blood pressure should be checked at least yearly.  Ongoing high blood pressure should be treated with medicines if weight loss and exercise do not work.   If you are 25-14 years old, ask your health care provider if you should take aspirin to prevent strokes.   Diabetes screening involves taking a blood sample to check your fasting blood sugar level. This should be done once every 3 years, after age 48, if you are within normal weight and without risk factors for diabetes. Testing should be considered at a younger age or be carried out more frequently if you are overweight and have at least 1 risk factor for diabetes.   Breast cancer screening is essential preventive care for women. You should practice "breast self-awareness."  This means understanding the normal appearance and feel of your breasts and may include breast self-examination.  Any changes detected, no matter how small, should be reported to a health care provider.  Women in their 89s and 30s should have a clinical breast exam (CBE) by a health care provider as part of a regular health exam every 1 to 3 years.  After age 63, women should have a CBE every year.  Starting at age 81, women should consider having a mammogram (breast X-ray test) every year.  Women who have a family history of breast cancer should talk to their health care provider about genetic screening.  Women at a high risk of breast cancer should talk to their health care providers about having an MRI and a mammogram every year.   -Breast cancer gene (BRCA)-related cancer risk assessment is recommended for women who have family members with BRCA-related cancers. BRCA-related cancers include breast, ovarian, tubal, and peritoneal cancers. Having family members with these cancers may be associated with an increased risk for harmful changes (mutations) in the breast cancer genes BRCA1 and  BRCA2. Results of the assessment will determine the need for genetic counseling and BRCA1 and BRCA2 testing.   The Pap test is a screening test for cervical cancer. A Pap test can show cell changes on the cervix that might become cervical cancer if left untreated. A Pap test is a procedure in which cells are obtained and examined from the lower end of the uterus (cervix).   - Women should have a Pap test starting at age 13.   - Between ages 51 and 22, Pap tests should be repeated every 2 years.   - Beginning at age 70, you should have a Pap test every 3 years as long as the past 3 Pap tests have been normal.   - Some women have medical problems that increase the chance of getting cervical cancer. Talk to your health care provider about these problems. It is especially important to talk to your health care provider if a  new problem develops soon after your last Pap test. In these cases, your health care provider may recommend more frequent screening and Pap tests.   - The above recommendations are the same for women who have or have not gotten the vaccine for human papillomavirus (HPV).   - If you had a hysterectomy for a problem that was not cancer or a condition that could lead to cancer, then you no longer need Pap tests. Even if you no longer need a Pap test, a regular exam is a good idea to make sure no other problems are starting.   - If you are between ages 8 and 94 years, and you have had normal Pap tests going back 10 years, you no longer need Pap tests. Even if you no longer need a Pap test, a regular exam is a good idea to make sure no other problems are starting.   - If you have had past treatment for cervical cancer or a condition that could lead to cancer, you need Pap tests and screening for cancer for at least 20 years after your treatment.   - If Pap tests have been discontinued, risk factors (such as a new sexual partner) need to be reassessed to determine if screening should  be resumed.   - The HPV test is an additional test that may be used for cervical cancer screening. The HPV test looks for the virus that can cause the cell changes on the cervix. The cells collected during the Pap test can be tested for HPV. The HPV test could be used to screen women aged 9 years and older, and should be used in women of any age who have unclear Pap test results. After the age of 48, women should have HPV testing at the same frequency as a Pap test.   Colorectal cancer can be detected and often prevented. Most routine colorectal cancer screening begins at the age of 94 years and continues through age 56 years. However, your health care provider may recommend screening at an earlier age if you have risk factors for colon cancer. On a yearly basis, your health care provider may provide home test kits to check for hidden blood in the stool.  Use of a small camera at the end of a tube, to directly examine the colon (sigmoidoscopy or colonoscopy), can detect the earliest forms of colorectal cancer. Talk to your health care provider about this at age 47, when routine screening begins. Direct exam of the colon should be repeated every 5 -10 years through age 65 years, unless early forms of pre-cancerous polyps or small growths are found.   People who are at an increased risk for hepatitis B should be screened for this virus. You are considered at high risk for hepatitis B if:  -You were born in a country where hepatitis B occurs often. Talk with your health care provider about which countries are considered high risk.  - Your parents were born in a high-risk country and you have not received a shot to protect against hepatitis B (hepatitis B vaccine).  - You have HIV or AIDS.  - You use needles to inject street drugs.  - You live with, or have sex with, someone who has Hepatitis B.  - You get hemodialysis treatment.  - You take certain medicines for conditions like cancer, organ  transplantation, and autoimmune conditions.   Hepatitis C blood testing is recommended for all people born from [birth year] through 1965 and any individual  with known risks for hepatitis C.   Practice safe sex. Use condoms and avoid high-risk sexual practices to reduce the spread of sexually transmitted infections (STIs). STIs include gonorrhea, chlamydia, syphilis, trichomonas, herpes, HPV, and human immunodeficiency virus (HIV). Herpes, HIV, and HPV are viral illnesses that have no cure. They can result in disability, cancer, and death. Sexually active women aged 17 years and younger should be checked for chlamydia. Older women with new or multiple partners should also be tested for chlamydia. Testing for other STIs is recommended if you are sexually active and at increased risk.   Osteoporosis is a disease in which the bones lose minerals and strength with aging. This can result in serious bone fractures or breaks. The risk of osteoporosis can be identified using a bone density scan. Women ages 38 years and over and women at risk for fractures or osteoporosis should discuss screening with their health care providers. Ask your health care provider whether you should take a calcium supplement or vitamin D to There are also several preventive steps women can take to avoid osteoporosis and resulting fractures or to keep osteoporosis from worsening. -->Recommendations include:  Eat a balanced diet high in fruits, vegetables, calcium, and vitamins.  Get enough calcium. The recommended total intake of is 1,200 mg daily; for best absorption, if taking supplements, divide doses into 250-500 mg doses throughout the day. Of the two types of calcium, calcium carbonate is best absorbed when taken with food but calcium citrate can be taken on an empty stomach.  Get enough vitamin D. [employer] and the [place] recommend at least 1,000 IU per day for women age 28 and over who are at risk of vitamin D  deficiency. Vitamin D deficiency can be caused by inadequate sun exposure (for example, those who live in [place]).  Avoid alcohol and smoking. Heavy alcohol intake (more than 7 drinks per week) increases the risk of falls and hip fracture and women smokers tend to lose bone more rapidly and have lower bone mass than nonsmokers. Stopping smoking is one of the most important changes women can make to improve their health and decrease risk for disease.  Be physically active every day. Weight-bearing exercise (for example, fast walking, hiking, jogging, and weight training) may strengthen bones or slow the rate of bone loss that comes with aging. Balancing and muscle-strengthening exercises can reduce the risk of falling and fracture.  Consider therapeutic medications. Currently, several types of effective drugs are available. Healthcare providers can recommend the type most appropriate for each woman.  Eliminate environmental factors that may contribute to accidents. Falls cause nearly 90% of all osteoporotic fractures, so reducing this risk is an important bone-health strategy. Measures include ample lighting, removing obstructions to walking, using nonskid rugs on floors, and placing mats and/or grab bars in showers.  Be aware of medication side effects. Some common medicines make bones weaker. These include a type of steroid drug called glucocorticoids used for arthritis and asthma, some antiseizure drugs, certain sleeping pills, treatments for endometriosis, and some cancer drugs. An overactive thyroid gland or using too much thyroid hormone for an underactive thyroid can also be a problem. If you are taking these medicines, talk to your doctor about what you can do to help protect your bones.reduce the rate of osteoporosis.    Menopause can be associated with physical symptoms and risks. Hormone replacement therapy is available to decrease symptoms and risks. You should talk to your  health care provider  about whether hormone replacement therapy is right for you.   Use sunscreen. Apply sunscreen liberally and repeatedly throughout the day. You should seek shade when your shadow is shorter than you. Protect yourself by wearing long sleeves, pants, a wide-brimmed hat, and sunglasses year round, whenever you are outdoors.   Once a month, do a whole body skin exam, using a mirror to look at the skin on your back. Tell your health care provider of new moles, moles that have irregular borders, moles that are larger than a pencil eraser, or moles that have changed in shape or color.   -Stay current with required vaccines (immunizations).   Influenza vaccine. All adults should be immunized every year.  Tetanus, diphtheria, and acellular pertussis (Td, Tdap) vaccine. Pregnant women should receive 1 dose of Tdap vaccine during each pregnancy. The dose should be obtained regardless of the length of time since the last dose. Immunization is preferred during the 27th 36th week of gestation. An adult who has not previously received Tdap or who does not know her vaccine status should receive 1 dose of Tdap. This initial dose should be followed by tetanus and diphtheria toxoids (Td) booster doses every 10 years. Adults with an unknown or incomplete history of completing a 3-dose immunization series with Td-containing vaccines should begin or complete a primary immunization series including a Tdap dose. Adults should receive a Td booster every 10 years.  Varicella vaccine. An adult without evidence of immunity to varicella should receive 2 doses or a second dose if she has previously received 1 dose. Pregnant females who do not have evidence of immunity should receive the first dose after pregnancy. This first dose should be obtained before leaving the health care facility. The second dose should be obtained 4 8 weeks after the first dose.  Human papillomavirus (HPV) vaccine. Females aged 13 26  years who have not received the vaccine previously should obtain the 3-dose series. The vaccine is not recommended for use in pregnant females. However, pregnancy testing is not needed before receiving a dose. If a female is found to be pregnant after receiving a dose, no treatment is needed. In that case, the remaining doses should be delayed until after the pregnancy. Immunization is recommended for any person with an immunocompromised condition through the age of 26 years if she did not get any or all doses earlier. During the 3-dose series, the second dose should be obtained 4 8 weeks after the first dose. The third dose should be obtained 24 weeks after the first dose and 16 weeks after the second dose.  Zoster vaccine. One dose is recommended for adults aged 60 years or older unless certain conditions are present.  Measles, mumps, and rubella (MMR) vaccine. Adults born before 1957 generally are considered immune to measles and mumps. Adults born in 1957 or later should have 1 or more doses of MMR vaccine unless there is a contraindication to the vaccine or there is laboratory evidence of immunity to each of the three diseases. A routine second dose of MMR vaccine should be obtained at least 28 days after the first dose for students attending postsecondary schools, health care workers, or international travelers. People who received inactivated measles vaccine or an unknown type of measles vaccine during 1963 1967 should receive 2 doses of MMR vaccine. People who received inactivated mumps vaccine or an unknown type of mumps vaccine before 1979 and are at high risk for mumps infection should consider immunization with 2 doses of   MMR vaccine. For females of childbearing age, rubella immunity should be determined. If there is no evidence of immunity, females who are not pregnant should be vaccinated. If there is no evidence of immunity, females who are pregnant should delay immunization until after pregnancy.  Unvaccinated health care workers born before 1957 who lack laboratory evidence of measles, mumps, or rubella immunity or laboratory confirmation of disease should consider measles and mumps immunization with 2 doses of MMR vaccine or rubella immunization with 1 dose of MMR vaccine.  Pneumococcal 13-valent conjugate (PCV13) vaccine. When indicated, a person who is uncertain of her immunization history and has no record of immunization should receive the PCV13 vaccine. An adult aged 19 years or older who has certain medical conditions and has not been previously immunized should receive 1 dose of PCV13 vaccine. This PCV13 should be followed with a dose of pneumococcal polysaccharide (PPSV23) vaccine. The PPSV23 vaccine dose should be obtained at least 8 weeks after the dose of PCV13 vaccine. An adult aged 19 years or older who has certain medical conditions and previously received 1 or more doses of PPSV23 vaccine should receive 1 dose of PCV13. The PCV13 vaccine dose should be obtained 1 or more years after the last PPSV23 vaccine dose.  Pneumococcal polysaccharide (PPSV23) vaccine. When PCV13 is also indicated, PCV13 should be obtained first. All adults aged 65 years and older should be immunized. An adult younger than age 65 years who has certain medical conditions should be immunized. Any person who resides in a nursing home or long-term care facility should be immunized. An adult smoker should be immunized. People with an immunocompromised condition and certain other conditions should receive both PCV13 and PPSV23 vaccines. People with human immunodeficiency virus (HIV) infection should be immunized as soon as possible after diagnosis. Immunization during chemotherapy or radiation therapy should be avoided. Routine use of PPSV23 vaccine is not recommended for American Indians, [place] Natives, or people younger than 65 years unless there are medical conditions that require PPSV23 vaccine. When indicated,  people who have unknown immunization and have no record of immunization should receive PPSV23 vaccine. One-time revaccination 5 years after the first dose of PPSV23 is recommended for people aged 19 64 years who have chronic kidney failure, nephrotic syndrome, asplenia, or immunocompromised conditions. People who received 1 2 doses of PPSV23 before age 65 years should receive another dose of PPSV23 vaccine at age 65 years or later if at least 5 years have passed since the previous dose. Doses of PPSV23 are not needed for people immunized with PPSV23 at or after age 65 years.  Meningococcal vaccine. Adults with asplenia or persistent complement component deficiencies should receive 2 doses of quadrivalent meningococcal conjugate (MenACWY-D) vaccine. The doses should be obtained at least 2 months apart. Microbiologists working with certain meningococcal bacteria, military recruits, people at risk during an outbreak, and people who travel to or live in countries with a high rate of meningitis should be immunized. A first-year college student up through age 21 years who is living in a residence hall should receive a dose if she did not receive a dose on or after her 16th birthday. Adults who have certain high-risk conditions should receive one or more doses of vaccine.  Hepatitis A vaccine. Adults who wish to be protected from this disease, have certain high-risk conditions, work with hepatitis A-infected animals, work in hepatitis A research labs, or travel to or work in countries with a high rate of hepatitis A should be   immunized. Adults who were previously unvaccinated and who anticipate close contact with an international adoptee during the first 60 days after arrival in the [place] from a country with a high rate of hepatitis A should be immunized.  Hepatitis B vaccine.  Adults who wish to be protected from this disease, have certain high-risk conditions, may be exposed to blood or other infectious  body fluids, are household contacts or sex partners of hepatitis B positive people, are clients or workers in certain care facilities, or travel to or work in countries with a high rate of hepatitis B should be immunized.  Haemophilus influenzae type b (Hib) vaccine. A previously unvaccinated person with asplenia or sickle cell disease or having a scheduled splenectomy should receive 1 dose of Hib vaccine. Regardless of previous immunization, a recipient of a hematopoietic stem cell transplant should receive a 3-dose series 6 12 months after her successful transplant. Hib vaccine is not recommended for adults with HIV infection.  Preventive Services / Frequency Ages 19 to 39years  Blood pressure check.** / Every 1 to 2 years.  Lipid and cholesterol check.** / Every 5 years beginning at age 20.  Clinical breast exam.** / Every 3 years for women in their 20s and 30s.  BRCA-related cancer risk assessment.** / For women who have family members with a BRCA-related cancer (breast, ovarian, tubal, or peritoneal cancers).  Pap test.** / Every 2 years from ages 21 through 29. Every 3 years starting at age 30 through age 65 or 70 with a history of 3 consecutive normal Pap tests.  HPV screening.** / Every 3 years from ages 30 through ages 65 to 70 with a history of 3 consecutive normal Pap tests.  Hepatitis C blood test.** / For any individual with known risks for hepatitis C.  Skin self-exam. / Monthly.  Influenza vaccine. / Every year.  Tetanus, diphtheria, and acellular pertussis (Tdap, Td) vaccine.** / Consult your health care provider. Pregnant women should receive 1 dose of Tdap vaccine during each pregnancy. 1 dose of Td every 10 years.  Varicella vaccine.** / Consult your health care provider. Pregnant females who do not have evidence of immunity should receive the first dose after pregnancy.  HPV vaccine. / 3 doses over 6 months, if 26 and younger. The vaccine is not recommended for use in  pregnant females. However, pregnancy testing is not needed before receiving a dose.  Measles, mumps, rubella (MMR) vaccine.** / You need at least 1 dose of MMR if you were born in 1957 or later. You may also need a 2nd dose. For females of childbearing age, rubella immunity should be determined. If there is no evidence of immunity, females who are not pregnant should be vaccinated. If there is no evidence of immunity, females who are pregnant should delay immunization until after pregnancy.  Pneumococcal 13-valent conjugate (PCV13) vaccine.** / Consult your health care provider.  Pneumococcal polysaccharide (PPSV23) vaccine.** / 1 to 2 doses if you smoke cigarettes or if you have certain conditions.  Meningococcal vaccine.** / 1 dose if you are age 19 to 21 years and a first-year college student living in a residence hall, or have one of several medical conditions, you need to get vaccinated against meningococcal disease. You may also need additional booster doses.  Hepatitis A vaccine.** / Consult your health care provider.  Hepatitis B vaccine.** / Consult your health care provider.  Haemophilus influenzae type b (Hib) vaccine.** / Consult your health care provider.  Ages 40 to 64years    Blood pressure check.** / Every 1 to 2 years.  Lipid and cholesterol check.** / Every 5 years beginning at age 22 years.  Lung cancer screening. / Every year if you are aged 81 80 years and have a 30-pack-year history of smoking and currently smoke or have quit within the past 15 years. Yearly screening is stopped once you have quit smoking for at least 15 years or develop a health problem that would prevent you from having lung cancer treatment.  Clinical breast exam.** / Every year after age 100 years.  BRCA-related cancer risk assessment.** / For women who have family members with a BRCA-related cancer (breast, ovarian, tubal, or peritoneal cancers).  Mammogram.** / Every year beginning at age 74  years and continuing for as long as you are in good health. Consult with your health care provider.  Pap test.** / Every 3 years starting at age 28 years through age 30 or 38 years with a history of 3 consecutive normal Pap tests.  HPV screening.** / Every 3 years from ages 85 years through ages 91 to 58 years with a history of 3 consecutive normal Pap tests.  Fecal occult blood test (FOBT) of stool. / Every year beginning at age 60 years and continuing until age 55 years. You may not need to do this test if you get a colonoscopy every 10 years.  Flexible sigmoidoscopy or colonoscopy.** / Every 5 years for a flexible sigmoidoscopy or every 10 years for a colonoscopy beginning at age 3 years and continuing until age 56 years.  Hepatitis C blood test.** / For all people born from [birth year] through 1967 and any individual with known risks for hepatitis C.  Skin self-exam. / Monthly.  Influenza vaccine. / Every year.  Tetanus, diphtheria, and acellular pertussis (Tdap/Td) vaccine.** / Consult your health care provider. Pregnant women should receive 1 dose of Tdap vaccine during each pregnancy. 1 dose of Td every 10 years.  Varicella vaccine.** / Consult your health care provider. Pregnant females who do not have evidence of immunity should receive the first dose after pregnancy.  Zoster vaccine.** / 1 dose for adults aged 50 years or older.  Measles, mumps, rubella (MMR) vaccine.** / You need at least 1 dose of MMR if you were born in 1957 or later. You may also need a 2nd dose. For females of childbearing age, rubella immunity should be determined. If there is no evidence of immunity, females who are not pregnant should be vaccinated. If there is no evidence of immunity, females who are pregnant should delay immunization until after pregnancy.  Pneumococcal 13-valent conjugate (PCV13) vaccine.** / Consult your health care provider.  Pneumococcal polysaccharide (PPSV23) vaccine.** / 1 to 2 doses if  you smoke cigarettes or if you have certain conditions.  Meningococcal vaccine.** / Consult your health care provider.  Hepatitis A vaccine.** / Consult your health care provider.  Hepatitis B vaccine.** / Consult your health care provider.  Haemophilus influenzae type b (Hib) vaccine.** / Consult your health care provider.  Ages 47 years and over  Blood pressure check.** / Every 1 to 2 years.  Lipid and cholesterol check.** / Every 5 years beginning at age 55 years.  Lung cancer screening. / Every year if you are aged 57 80 years and have a 30-pack-year history of smoking and currently smoke or have quit within the past 15 years. Yearly screening is stopped once you have quit smoking for at least 15 years or develop a health problem that  would prevent you from having lung cancer treatment.  Clinical breast exam.** / Every year after age 68 years.  BRCA-related cancer risk assessment.** / For women who have family members with a BRCA-related cancer (breast, ovarian, tubal, or peritoneal cancers).  Mammogram.** / Every year beginning at age 47 years and continuing for as long as you are in good health. Consult with your health care provider.  Pap test.** / Every 3 years starting at age 59 years through age 26 or 26 years with 3 consecutive normal Pap tests. Testing can be stopped between 65 and 70 years with 3 consecutive normal Pap tests and no abnormal Pap or HPV tests in the past 10 years.  HPV screening.** / Every 3 years from ages 71 years through ages 19 or 96 years with a history of 3 consecutive normal Pap tests. Testing can be stopped between 65 and 70 years with 3 consecutive normal Pap tests and no abnormal Pap or HPV tests in the past 10 years.  Fecal occult blood test (FOBT) of stool. / Every year beginning at age 59 years and continuing until age 7 years. You may not need to do this test if you get a colonoscopy every 10 years.  Flexible sigmoidoscopy or colonoscopy.** /  Every 5 years for a flexible sigmoidoscopy or every 10 years for a colonoscopy beginning at age 28 years and continuing until age 27 years.  Hepatitis C blood test.** / For all people born from [birth year] through 1967 and any individual with known risks for hepatitis C.  Osteoporosis screening.** / A one-time screening for women ages 6 years and over and women at risk for fractures or osteoporosis.  Skin self-exam. / Monthly.  Influenza vaccine. / Every year.  Tetanus, diphtheria, and acellular pertussis (Tdap/Td) vaccine.** / 1 dose of Td every 10 years.  Varicella vaccine.** / Consult your health care provider.  Zoster vaccine.** / 1 dose for adults aged 20 years or older.  Pneumococcal 13-valent conjugate (PCV13) vaccine.** / Consult your health care provider.  Pneumococcal polysaccharide (PPSV23) vaccine.** / 1 dose for all adults aged 95 years and older.  Meningococcal vaccine.** / Consult your health care provider.  Hepatitis A vaccine.** / Consult your health care provider.  Hepatitis B vaccine.** / Consult your health care provider.  Haemophilus influenzae type b (Hib) vaccine.** / Consult your health care provider. ** Family history and personal history of risk and conditions may change your health care provider's recommendations. Document Released: 10/02/2001 Document Revised: 05/27/2013  [facility] Patient Information 2014 [place], [place].   EXERCISE AND DIET:  We recommended that you start or continue a regular exercise program for good health. Regular exercise means any activity that makes your heart beat faster and makes you sweat.  We recommend exercising at least 30 minutes per day at least 3 days a week, preferably 5.  We also recommend a diet low in fat and sugar / carbohydrates.  Inactivity, poor dietary choices and obesity can cause diabetes, heart attack, stroke, and kidney damage, among others.     ALCOHOL AND SMOKING:  Women should limit their alcohol intake to no  more than 7 drinks/beers/glasses of wine (combined, not each!) per week. Moderation of alcohol intake to this level decreases your risk of breast cancer and liver damage.  ( And of course, no recreational drugs are part of a healthy lifestyle.)  Also, you should not be smoking at all or even being exposed to second hand smoke. Most people know smoking can  cause cancer, and various heart and lung diseases, but did you know it also contributes to weakening of your bones?  Aging of your skin?  Yellowing of your teeth and nails?   CALCIUM AND VITAMIN D:  Adequate intake of calcium and Vitamin D are recommended.  The recommendations for exact amounts of these supplements seem to change often, but generally speaking 600 mg of calcium (either carbonate or citrate) and 800 units of Vitamin D per day seems prudent. Certain women may benefit from higher intake of Vitamin D.  If you are among these women, your doctor will have told you during your visit.     PAP SMEARS:  Pap smears, to check for cervical cancer or precancers,  have traditionally been done yearly, although recent scientific advances have shown that most women can have pap smears less often.  However, every woman still should have a physical exam from her gynecologist or primary care physician every year. It will include a breast check, inspection of the vulva and vagina to check for abnormal growths or skin changes, a visual exam of the cervix, and then an exam to evaluate the size and shape of the uterus and ovaries.  And after 30 years of age, a rectal exam is indicated to check for rectal cancers. We will also provide age appropriate advice regarding health maintenance, like when you should have certain vaccines, screening for sexually transmitted diseases, bone density testing, colonoscopy, mammograms, etc.    MAMMOGRAMS:  All women over 40 years old should have a yearly mammogram. Many facilities now offer a "3D" mammogram, which may cost  around $50 extra out of pocket. If possible,  we recommend you accept the option to have the 3D mammogram performed.  It both reduces the number of women who will be called back for extra views which then turn out to be normal, and it is better than the routine mammogram at detecting truly abnormal areas.     COLONOSCOPY:  Colonoscopy to screen for colon cancer is recommended for all women at age 27.  We know, you hate the idea of the prep.  We agree, BUT, having colon cancer and not knowing it is worse!!  Colon cancer so often starts as a polyp that can be seen and removed at colonscopy, which can quite literally save your life!  And if your first colonoscopy is normal and you have no family history of colon cancer, most women don't have to have it again for 10 years.  Once every ten years, you can do something that may end up saving your life, right?  We will be happy to help you get it scheduled when you are ready.  Be sure to check your insurance coverage so you understand how much it will cost.  It may be covered as a preventative service at no cost, but you should check your particular policy.

## 2019-07-23 LAB — COMPREHENSIVE METABOLIC PANEL
ALT: 73 IU/L — ABNORMAL HIGH (ref 0–32)
AST: 33 IU/L (ref 0–40)
Albumin/Globulin Ratio: 2 (ref 1.2–2.2)
Albumin: 4.7 g/dL (ref 3.9–5.0)
Alkaline Phosphatase: 50 IU/L (ref 39–117)
BUN/Creatinine Ratio: 18 (ref 9–23)
BUN: 15 mg/dL (ref 6–20)
Bilirubin Total: 0.3 mg/dL (ref 0.0–1.2)
CO2: 21 mmol/L (ref 20–29)
Calcium: 9.7 mg/dL (ref 8.7–10.2)
Chloride: 103 mmol/L (ref 96–106)
Creatinine, Ser: 0.85 mg/dL (ref 0.57–1.00)
GFR calc Af Amer: 106 mL/min/{1.73_m2} (ref >59)
GFR calc non Af Amer: 92 mL/min/{1.73_m2} (ref >59)
Globulin, Total: 2.3 g/dL (ref 1.5–4.5)
Glucose: 88 mg/dL (ref 65–99)
Potassium: 4.9 mmol/L (ref 3.5–5.2)
Sodium: 139 mmol/L (ref 134–144)
Total Protein: 7 g/dL (ref 6.0–8.5)

## 2019-07-23 LAB — T4, FREE: Free T4: 1.11 ng/dL (ref 0.82–1.77)

## 2019-07-23 LAB — LIPID PANEL
Chol/HDL Ratio: 3.9 ratio (ref 0.0–4.4)
Cholesterol, Total: 201 mg/dL — ABNORMAL HIGH (ref 100–199)
HDL: 51 mg/dL (ref >39)
LDL Chol Calc (NIH): 128 mg/dL — ABNORMAL HIGH (ref 0–99)
Triglycerides: 122 mg/dL (ref 0–149)
VLDL Cholesterol Cal: 22 mg/dL (ref 5–40)

## 2019-07-23 LAB — CBC WITH DIFFERENTIAL/PLATELET
Basophils Absolute: 0 10*3/uL (ref 0.0–0.2)
Basos: 0 %
EOS (ABSOLUTE): 0.1 10*3/uL (ref 0.0–0.4)
Eos: 1 %
Hematocrit: 42.7 % (ref 34.0–46.6)
Hemoglobin: 14.1 g/dL (ref 11.1–15.9)
Immature Grans (Abs): 0 10*3/uL (ref 0.0–0.1)
Immature Granulocytes: 0 %
Lymphocytes Absolute: 3.1 10*3/uL (ref 0.7–3.1)
Lymphs: 41 %
MCH: 30.8 pg (ref 26.6–33.0)
MCHC: 33 g/dL (ref 31.5–35.7)
MCV: 93 fL (ref 79–97)
Monocytes Absolute: 0.6 10*3/uL (ref 0.1–0.9)
Monocytes: 8 %
Neutrophils Absolute: 3.8 10*3/uL (ref 1.4–7.0)
Neutrophils: 50 %
Platelets: 187 10*3/uL (ref 150–450)
RBC: 4.58 x10E6/uL (ref 3.77–5.28)
RDW: 11.8 % (ref 11.7–15.4)
WBC: 7.6 10*3/uL (ref 3.4–10.8)

## 2019-07-23 LAB — HEMOGLOBIN A1C
Est. average glucose Bld gHb Est-mCnc: 105 mg/dL
Hgb A1c MFr Bld: 5.3 % (ref 4.8–5.6)

## 2019-07-23 LAB — T3: T3, Total: 117 ng/dL (ref 71–180)

## 2019-07-23 LAB — VITAMIN D 25 HYDROXY (VIT D DEFICIENCY, FRACTURES): Vit D, 25-Hydroxy: 38.3 ng/mL (ref 30.0–100.0)

## 2019-07-23 LAB — TSH: TSH: 1.76 u[IU]/mL (ref 0.450–4.500)

## 2019-08-02 ENCOUNTER — Ambulatory Visit (INDEPENDENT_AMBULATORY_CARE_PROVIDER_SITE_OTHER)
Admission: RE | Admit: 2019-08-02 | Discharge: 2019-08-02 | Disposition: A | Discharge status: Home / Self Care | Payer: BC Managed Care – PPO | Source: Ambulatory Visit

## 2019-08-02 DIAGNOSIS — W5501XA Bitten by cat, initial encounter: Secondary | ICD-10-CM

## 2019-08-02 DIAGNOSIS — S61452A Open bite of left hand, initial encounter: Secondary | ICD-10-CM | POA: Diagnosis not present

## 2019-08-02 DIAGNOSIS — S61451A Open bite of right hand, initial encounter: Secondary | ICD-10-CM

## 2019-08-02 MED ORDER — METRONIDAZOLE 500 MG PO TABS: 500.0000 mg | ORAL_TABLET | Freq: Three times a day (TID) | ORAL | 0 refills | Status: AC

## 2019-08-02 MED ORDER — DOXYCYCLINE HYCLATE 100 MG PO CAPS: 100.0000 mg | ORAL_CAPSULE | Freq: Two times a day (BID) | ORAL | 0 refills | Status: DC

## 2019-08-02 NOTE — Discharge Instructions (Signed)
Wash with warm water and mild soap Change dressing daily Doxycycline and metronidazole prescribed.  Take as directed and to completion Follow up in person in 24-48 hours if symptoms worsen or fail to improve with antibiotics Follow up in person or go to the ED immediately if you have any new or worsening symptoms such as increasing redness, swelling, drainage, fever, chills, nausea, chest pain, SOB, etc..Marland Kitchen

## 2019-08-02 NOTE — ED Provider Notes (Signed)
[facility]     Virtual Visit via Video Note:  April Harris  initiated request for Telemedicine visit with [facility] Urgent [facility] team. I connected with Lehman Prom  on 08/02/2019 at 2:35 PM  for a synchronized telemedicine visit using a video enabled HIPPA compliant telemedicine application. I verified that I am speaking with Lehman Prom  using two identifiers. Lestine Box, PA-C  was physically located in a [facility] Urgent care site and SAVON COBBS was located at a different location.   The limitations of evaluation and management by telemedicine as well as the availability of in-person appointments were discussed. Patient was informed that she  may incur a bill ( including co-pay) for this virtual visit encounter. April Harris  expressed understanding and gave verbal consent to proceed with virtual visit.   [id number] 08/02/19 Arrival Time: 7253  CC: Cat bite  SUBJECTIVE:  April Harris is a 28 y.o. female who presents with a cat bites to bilateral hands x 1 day.  Symptoms began after picking up brother's cat, that had been cornered by dogs.  The cat bit both of her hands.  Describes it as painful, red, and swollen.  States redness has began to spread.  Flushed out wounds, and applied bactroban with minimal relief.  Symptoms are made worse to the touch.   Denies fever, chills, nausea, vomiting, discharge, SOB, chest pain, abdominal pain, changes in bowel or bladder function.    Up-to-date on tetanus shot; March 2019 Brother's cat, patient is not concerned for rabies.  Cat is 47.49 year old cat  ROS: As per HPI.  All other pertinent ROS negative.     Past Medical History:  Diagnosis Date  . Asthma   . IBS (irritable bowel syndrome)   . LGSIL on Pap smear of cervix    Past Surgical History:  Procedure Laterality Date  . COLPOSCOPY    . DILATION AND CURETTAGE OF UTERUS    . INDUCED ABORTION    . TONSILLECTOMY     Allergies  Allergen  Reactions  . Penicillins    No current facility-administered medications on file prior to encounter.   Current Outpatient Medications on File Prior to Encounter  Medication Sig Dispense Refill  . b complex vitamins tablet Take 1 tablet by mouth daily.    Marland Kitchen BIOTIN PO Take 5,000 mcg by mouth daily.    . cetirizine (ZYRTEC) 5 MG tablet Take 5 mg by mouth daily.    Marland Kitchen levonorgestrel (MIRENA) 20 MCG/24HR IUD 1 each by Intrauterine route once.    Marland Kitchen MAGNESIUM CITRATE PO Take 150 mg by mouth daily.    . Multiple Vitamin (MULTIVITAMIN) capsule Take 1 capsule by mouth daily.    . Probiotic Product (PROBIOTIC DAILY PO) Take by mouth 2 (two) times daily.    . sertraline (ZOLOFT) 50 MG tablet Take 1 tablet (50 mg total) by mouth at bedtime. 90 tablet 3  . TURMERIC PO Take by mouth. 500-1000mg  daily    . Valerian Root 530 MG CAPS Take by mouth daily.    Marland Kitchen VITAMIN D PO Take 50 mcg by mouth daily.      OBJECTIVE: There were no vitals filed for this visit.  General appearance: alert; no distress Eyes: EOMI grossly HENT: normocephalic; atraumatic Neck: supple with FROM Lungs: normal respiratory effort; speaking in full sentences without difficulty Extremities: moves extremities without difficulty Skin: multiple puncture wounds to bilateral hands with surrounding erythema and swelling  including both palmar and dorsal aspects (see picture below of LT hand) Neurologic: normal facial expressions Psychological: alert and cooperative; normal mood and affect; very pleasant    ASSESSMENT & PLAN:  1. Cat bite of left hand, initial encounter   2. Cat bite of right hand, initial encounter     Meds ordered this encounter  Medications  . doxycycline (VIBRAMYCIN) 100 MG capsule    Sig: Take 1 capsule (100 mg total) by mouth 2 (two) times daily.    Dispense:  20 capsule    Refill:  0    Order Specific Question:   Supervising Provider    Answer:   Eustace Moore [[id number]]  . metroNIDAZOLE (FLAGYL)  500 MG tablet    Sig: Take 1 tablet (500 mg total) by mouth 3 (three) times daily for 10 days.    Dispense:  30 tablet    Refill:  0    Order Specific Question:   Supervising Provider    Answer:   Eustace Moore [[id number]]    Wash with warm water and mild soap Change dressing daily Doxycycline and metronidazole prescribed.  Take as directed and to completion Follow up in person in 24-48 hours if symptoms worsen or fail to improve with antibiotics Follow up in person or go to the ED immediately if you have any new or worsening symptoms such as increasing redness, swelling, drainage, fever, chills, nausea, chest pain, SOB, etc...   I discussed the assessment and treatment plan with the patient. The patient was provided an opportunity to ask questions and all were answered. The patient agreed with the plan and demonstrated an understanding of the instructions.   The patient was advised to call back or seek an in-person evaluation if the symptoms worsen or if the condition fails to improve as anticipated.  I provided 10 minutes of non-face-to-face time during this encounter.  [place], PA-C  08/02/2019 2:35 PM    Rennis Harding, PA-C 08/02/19 1438

## 2019-08-11 ENCOUNTER — Ambulatory Visit (INDEPENDENT_AMBULATORY_CARE_PROVIDER_SITE_OTHER): Payer: BC Managed Care – PPO | Admitting: Family Medicine

## 2019-08-11 ENCOUNTER — Encounter: Payer: Self-pay | Admitting: Family Medicine

## 2019-08-11 ENCOUNTER — Other Ambulatory Visit: Payer: Self-pay

## 2019-08-11 VITALS — Temp 98.0°F | Ht 65.0 in | Wt 158.0 lb

## 2019-08-11 DIAGNOSIS — E785 Hyperlipidemia, unspecified: Secondary | ICD-10-CM

## 2019-08-11 DIAGNOSIS — R748 Abnormal levels of other serum enzymes: Secondary | ICD-10-CM | POA: Insufficient documentation

## 2019-08-11 DIAGNOSIS — Z720 Tobacco use: Secondary | ICD-10-CM

## 2019-08-11 DIAGNOSIS — E559 Vitamin D deficiency, unspecified: Secondary | ICD-10-CM

## 2019-08-11 DIAGNOSIS — Z716 Tobacco abuse counseling: Secondary | ICD-10-CM

## 2019-08-11 DIAGNOSIS — Z83438 Family history of other disorder of lipoprotein metabolism and other lipidemia: Secondary | ICD-10-CM | POA: Diagnosis not present

## 2019-08-11 DIAGNOSIS — F1020 Alcohol dependence, uncomplicated: Secondary | ICD-10-CM

## 2019-08-11 MED ORDER — VITAMIN D (ERGOCALCIFEROL) 1.25 MG (50000 UNIT) PO CAPS: ORAL_CAPSULE | ORAL | 3 refills | Status: DC

## 2019-08-11 NOTE — Patient Instructions (Signed)
Guidelines for a Low Cholesterol, Low Saturated Fat Diet ° °Fats °- Limit total intake of fats and oils. °- Avoid butter, stick margarine, shortening, lard, palm and coconut oils. °- Limit mayonnaise, salad dressings, gravies and sauces, unless they are homemade with low-fat ingredients. °- Limit chocolate. °- Choose low-fat and nonfat products, such as low-fat mayonnaise, low-fat or non-hydrogenated peanut butter, low-fat or fat-free salad dressings and nonfat gravy. °- Use vegetable oil, such as canola or olive oil. °- Look for margarine that does not contain trans fatty acids. °- Use nuts in moderate amounts. °- Read ingredient labels carefully to determine both amount and type of fat present in foods. Limit saturated and trans fats! °- Avoid high-fat processed and convenience foods. ° °Meats and Meat Alternatives °- Choose fish, chicken, turkey and lean meats. °- Use dried beans, peas, lentils and tofu. °- Limit egg yolks to three to four per week. °- If you eat red meat, limit to no more than three servings per week and choose loin or round cuts. °- Avoid fatty meats, such as bacon, sausage, franks, luncheon meats and ribs. °- Avoid all organ meats, including liver. ° °Dairy °- Choose nonfat or low-fat milk, yogurt and cottage cheese. °- Most cheeses are high in fat. Choose cheeses made from non-fat milk, such as mozzarella and ricotta cheese. °- Choose light or fat-free cream cheese and sour cream. °- Avoid cream and sauces made with cream. ° °Fruits and Vegetables °- Eat a wide variety of fruits and vegetables. °- Use lemon juice, vinegar or "mist" olive oil on vegetables. °- Avoid adding sauces, fat or oil to vegetables. ° °Breads, Cereals and Grains °- Choose whole-grain breads, cereals, pastas and rice. °- Avoid high-fat snack foods, such as granola, cookies, pies, pastries, doughnuts and croissants. ° °Cooking Tips °- Avoid deep fried foods. °- Trim visible fat off meats and remove skin from poultry  before cooking. °- Bake, broil, boil, poach or roast poultry, fish and lean meats. °- Drain and discard fat that drains out of meat as you cook it. °- Add little or no fat to foods. °- Use vegetable oil sprays to grease pans for cooking or baking. °- Steam vegetables. °- Use herbs or no-oil marinades to flavor foods. °Nine ways to increase your "good" HDL cholesterol ° °High-density lipoprotein (HDL) is often referred to as the "good" cholesterol. °Having high HDL levels helps carry cholesterol from your arteries to your liver, where it can be used or excreted. ° °Having high levels of HDL also has antioxidant and anti-inflammatory effects, and is linked to a reduced risk of heart disease (1, 2). ° °Most health experts recommend minimum blood levels of 40 mg/dl in men and 50 mg/dl in women. ° °While genetics definitely play a role, there are several other factors that affect HDL levels. ° °Here are nine healthy ways to raise your "good" HDL cholesterol. ° °1. Consume olive oil ° °two pieces of salmon on a plate °olive oil being poured into a small dish °Extra virgin olive oil may be more healthful than processed olive oils. °Olive oil is one of the healthiest fats around. ° °A large analysis of 42 studies with more than 800,000 participants found that olive oil was the only source of monounsaturated fat that seemed to reduce heart disease risk (3). ° °Research has shown that one of olive oil's heart-healthy effects is an increase in HDL cholesterol. This effect is thought to be caused by antioxidants it contains called polyphenols (  4, 5, 6, 7). ° °Extra virgin olive oil has more polyphenols than more processed olive oils, although the amount can still vary among different types and brands. ° °One study gave 200 healthy young men about 2 tablespoons (25 ml) of different olive oils per day for three weeks. ° °The researchers found that participants' HDL levels increased significantly more after they consumed the olive  oil with the highest polyphenol content (6). ° °In another study, when 62 older adults consumed about 4 tablespoons (50 ml) of high-polyphenol extra virgin olive oil every day for six weeks, their HDL cholesterol increased by 6.5 mg/dl, on average (7). ° °In addition to raising HDL levels, olive oil has been found to boost HDL's anti-inflammatory and antioxidant function in studies of older people and individuals with high cholesterol levels ( 7, 8, 9). ° °Whenever possible, select high-quality, certified extra virgin olive oils, which tend to be highest in polyphenols. ° °Bottom line: Extra virgin olive oil with a high polyphenol content has been shown to increase HDL levels in healthy people, the elderly and individuals with high cholesterol. ° °2. Follow a low-carb or ketogenic diet ° °Low-carb and ketogenic diets provide a number of health benefits, including weight loss and reduced blood sugar levels. ° °They have also been shown to increase HDL cholesterol in people who tend to have lower levels. ° °This includes those who are obese, insulin resistant or diabetic (10, 11, 12, 13, 14, 15, 16, 17). ° °In one study, people with type 2 diabetes were split into two groups. ° °One followed a diet consuming less than 50 grams of carbs per day. The other followed a high-carb diet. ° °Although both groups lost weight, the low-carb group's HDL cholesterol increased almost twice as much as the high-carb group's did (14). ° °In another study, obese people who followed a low-carb diet experienced an increase in HDL cholesterol of 5 mg/dl overall. ° °Meanwhile, in the same study, the participants who ate a low-fat, high-carb diet showed a decrease in HDL cholesterol (15). ° °This response may partially be due to the higher levels of fat people typically consume on low-carb diets. ° °One study in overweight women found that diets high in meat and cheese increased HDL levels by 5-8%, compared to a higher-carb diet  (18). ° °What's more, in addition to raising HDL cholesterol, very-low-carb diets have been shown to decrease triglycerides and improve several other risk factors for heart disease (13, 14, 16, 17). ° °Bottom line: Low-carb and ketogenic diets typically increase HDL cholesterol levels in people with diabetes, metabolic syndrome and obesity. ° °3. Exercise regularly ° °Being physically active is important for heart health. ° °Studies have shown that many different types of exercise are effective at raising HDL cholesterol, including strength training, high-intensity exercise and aerobic exercise (19, 20, 21, 22, 23, 24). ° °However, the biggest increases in HDL are typically seen with high-intensity exercise. ° °One small study followed women who were living with polycystic ovary syndrome (PCOS), which is linked to a higher risk of insulin resistance. The study required them to perform high-intensity exercise three times a week. ° °The exercise led to an increase in HDL cholesterol of 8 mg/dL after 10 weeks. The women also showed improvements in other health markers, including decreased insulin resistance and improved arterial function (23). ° °In a 12-week study, overweight men who performed high-intensity exercise experienced a 10% increase in HDL cholesterol. ° °In contrast, the low-intensity exercise group showed only a   2% increase and the endurance training group experienced no change (24). ° °However, even lower-intensity exercise seems to increase HDL's anti-inflammatory and antioxidant capacities, whether or not HDL levels change (20, 21, 25). ° °Overall, high-intensity exercise such as high-intensity interval training (HIIT) and high-intensity circuit training (HICT) may boost HDL cholesterol levels the most. ° °Bottom line: Exercising several times per week can help raise HDL cholesterol and enhance its anti-inflammatory and antioxidant effects. High-intensity forms of exercise may be especially  effective. ° °4. Add coconut oil to your diet ° °Studies have shown that coconut oil may reduce appetite, increase metabolic rate and help protect brain health, among other benefits. ° °Some people may be concerned about coconut oil's effects on heart health due to its high saturated fat content. ° °However, it appears that coconut oil is actually quite heart healthy. ° °Coconut oil tends to raise HDL cholesterol more than many other types of fat. ° °In addition, it may improve the ratio of low-density-lipoprotein (LDL) cholesterol, the "bad" cholesterol, to HDL cholesterol. Improving this ratio reduces heart disease risk (26, 27, 28, 29). ° °One study examined the health effects of coconut oil on 40 women with excess belly fat. The researchers found that participants who took coconut oil daily experienced increased HDL cholesterol and a lower LDL-to-HDL ratio. ° °In contrast, the group who took soybean oil daily had a decrease in HDL cholesterol and an increase in the LDL-to-HDL ratio (29). ° °Most studies have found these health benefits occur at a dosage of about 2 tablespoons (30 ml) of coconut oil per day. It's best to incorporate this into cooking rather than eating spoonfuls of coconut oil on their own. ° °Bottom line: Consuming 2 tablespoons (30 ml) of coconut oil per day may help increase HDL cholesterol levels. ° °5. Stop smoking ° °cigarette butt °Quitting smoking can reduce the risk of heart disease and lung cancer. °Smoking increases the risk of many health problems, including heart disease and lung cancer (30). ° °One of its negative effects is a suppression of HDL cholesterol. ° °Some studies have found that quitting smoking can increase HDL levels. Indeed, one study found no significant differences in HDL levels between former smokers and people who had never smoked (31, 32, 33, 34, 35). ° °In a one-year study of more than 1,500 people, those who quit smoking had twice the increase in HDL as those  who resumed smoking within the year. The number of large HDL particles also increased, which further reduced heart disease risk (32). ° °One study followed smokers who switched from traditional cigarettes to electronic cigarettes for one year. They found that the switch was associated with an increase in HDL cholesterol of 5 mg/dl, on average (33). ° °When it comes to the effect of nicotine replacement patches on HDL levels, research results have been mixed. ° °One study found that nicotine replacement therapy led to higher HDL cholesterol. However, other research suggests that people who use nicotine patches likely won't see increases in HDL levels until after replacement therapy is completed (34, 36). ° °Even in studies where HDL cholesterol levels didn't increase after people quit smoking, HDL function improved, resulting in less inflammation and other beneficial effects on heart health (37). ° °Bottom line: Quitting smoking can increase HDL levels, improve HDL function and help protect heart health. ° °6. Lose weight ° °When overweight and obese people lose weight, their HDL cholesterol levels usually increase. ° °What's more, this benefit seems to occur whether weight   loss is achieved by calorie counting, carb restriction, intermittent fasting, weight loss surgery or a combination of diet and exercise (16, 38, 39, 40, 41, 42). ° °One study examined HDL levels in more than 3,000 overweight and obese [place] adults who followed a lifestyle modification program for one year. ° °The researchers found that losing at least 6.6 lbs (3 kg) led to an increase in HDL cholesterol of 4 mg/dl, on average (41). ° °In another study, when obese people with type 2 diabetes consumed calorie-restricted diets that provided 20-30% of calories from protein, they experienced significant increases in HDL cholesterol levels (42). ° °The key to achieving and maintaining healthy HDL cholesterol levels is choosing the type of diet that  makes it easiest for you to lose weight and keep it off. ° °Bottom Line: Several methods of weight loss have been shown to increase HDL cholesterol levels in people who are overweight or obese. ° °7. Choose purple produce ° °Consuming purple-colored fruits and vegetables is a delicious way to potentially increase HDL cholesterol. ° °Purple produce contains antioxidants known as anthocyanins. ° °Studies using anthocyanin extracts have shown that they help fight inflammation, protect your cells from damaging free radicals and may also raise HDL cholesterol levels (43, 44, 45, 46). ° °In a 24-week study of 58 people with diabetes, those who took an anthocyanin supplement twice a day experienced a 19% increase in HDL cholesterol, on average, along with other improvements in heart health markers (45). ° °In another study, when people with cholesterol issues took anthocyanin extract for 12 weeks, their HDL cholesterol levels increased by 13.7% (46). ° °Although these studies used extracts instead of foods, there are several fruits and vegetables that are very high in anthocyanins. These include eggplant, purple corn, red cabbage, blueberries, blackberries and black raspberries. ° °Bottom line: Consuming fruits and vegetables rich in anthocyanins may help increase HDL cholesterol levels. ° °8. Eat fatty fish often ° °The omega-3 fats in fatty fish provide major benefits to heart health, including a reduction in inflammation and better functioning of the cells that line your arteries (47, 48). ° °There's some research showing that eating fatty fish or taking fish oil may also help raise low levels of HDL cholesterol (49, 50, 51, 52, 53). ° °In a study of 33 heart disease patients, participants that consumed fatty fish four times per week experienced an increase in HDL cholesterol levels. The particle size of their HDL also increased (52). ° °In another study, overweight men who consumed herring five days a week for six  weeks had a 5% increase in HDL cholesterol, compared with their levels after eating lean pork and chicken five days a week (53). ° °However, there are a few studies that found no increase in HDL cholesterol in response to increased fish or omega-3 supplement intake (54, 55). ° °In addition to herring, other types of fatty fish that may help raise HDL cholesterol include salmon, sardines, mackerel and anchovies. ° °Bottom line: Eating fatty fish several times per week may help increase HDL cholesterol levels and provide other benefits to heart health. ° °9. Avoid artificial trans fats ° °Artificial trans fats have many negative health effects due to their inflammatory properties (56, 57). ° °There are two types of trans fats. One kind occurs naturally in animal products, including full-fat dairy. ° °In contrast, the artificial trans fats found in margarines and processed foods are created by adding hydrogen to unsaturated vegetable and seed oils. These fats are also   known as industrial trans fats or partially hydrogenated fats. ° °Research has shown that, in addition to increasing inflammation and contributing to several health problems, these artificial trans fats may lower HDL cholesterol levels. ° °In one study, researchers compared how people's HDL levels responded when they consumed different margarines. ° °The study found that participants' HDL cholesterol levels were 10% lower after consuming margarine containing partially hydrogenated soybean oil, compared to their levels after consuming palm oil (58). ° °Another controlled study followed 40 adults who had diets high in different types of trans fats. ° °They found that HDL cholesterol levels in women were significantly lower after they consumed the diet high in industrial trans fats, compared to the diet containing naturally occurring trans fats (59). ° °To protect heart health and keep HDL cholesterol in the healthy range, it's best to avoid artificial trans  fats altogether. ° °Bottom line: Artificial trans fats have been shown to lower HDL levels and increase inflammation, compared to other fats. ° °Take home message ° °Although your HDL cholesterol levels are partly determined by your genetics, there are many things you can do to naturally increase your own levels. ° °Fortunately, the practices that raise HDL cholesterol often provide other health benefits as well. ° °

## 2019-08-11 NOTE — Progress Notes (Signed)
Virtual / live video office visit note for [employer], D.O- Primary Care Physician at [facility]   I connected with current patient today and beyond visually recognizing the correct individual, I verified that I am speaking with the correct person using two identifiers.  . Location of the patient: Home . Location of the provider: Office Only the patient (+/- their family members at pt's discretion) and myself were participating in the encounter    - This visit type was conducted due to national recommendations for restrictions regarding the COVID-19 Pandemic (e.g. social distancing) in an effort to limit this patient's exposure and mitigate transmission in our community.  This format is felt to be most appropriate for this patient at this time.   - The patient did have access to video technology today  - No physical exam could be performed with this format, beyond that communicated to [place] by the patient/ family members as noted.   - Additionally my office staff/ schedulers discussed with the patient that there may be a monetary charge related to this service, depending on patient's medical insurance.   The patient expressed understanding, and agreed to proceed.      History of Present Illness: Labs Only (discuss labs from physical. Cholesterol and ALT)   I, Toni Amend, am serving as scribe for Dr. Mellody Dance.  Notes she is feeling good during this holiday season.  - Current Smoker She is a current smoker.  Notes she's down to smoking about 4-6 cigarettes per day.  Says she hasn't quit because "It's just the habit; I like a cigarette with my coffee in the morning."  - Recent Cat Bite Has been taking ibuprofen the past week since being bitten by a cat.  She consulted with Urgent Care regarding the cat bite and notes things are improving since taking NSAID's and antibiotics.  - Alcohol Sobriety Going on seven months sober in a few days.  - Elevated ALT Patient  does not take tylenol regularly.  She does not know if her liver enzymes have been elevated in the past.  States it's been a long time since she had blood work drawn.  - Exercise Habits Notes now that she is working 8 to 5, she is still hiking on the weekends and walking, but doesn't have the sunlight to hike daily.  She walks around the parking lot during lunch, but is not getting her 6 miles during the week.  - History of Weight Loss Notes she plans on doubling down on her macros after the holiday season.  She was working on healthy eating and weight loss in the past.  Notes slacked off recently because she lost so much weight, so quickly, and it was holiday season.  She does plan to resume her prudent habits soon, including intermittent fasting and breaking her fast with a super green smoothie.  - Hyperlipidemia, Family History of Hyperlipidemia Notes her mom is managed on atorvastatin once weekly.    Patient notes she eats a lot of whole food, avoids fried food, and doesn't eat a lot of red meat or add a lot of fat.  If she does add fats to meals, she uses olive oil, coconut oil, or a little bit of butter.  Most recent cholesterol panel was:  Lab Results  Component Value Date   CHOL 201 (H) 07/22/2019   HDL 51 07/22/2019   LDLCALC 128 (H) 07/22/2019   TRIG 122 07/22/2019   CHOLHDL 3.9  07/22/2019   Hepatic Function Latest Ref Rng & Units 07/22/2019  Total Protein 6.0 - 8.5 g/dL 7.0  Albumin 3.9 - 5.0 g/dL 4.7  AST 0 - 40 IU/L 33  ALT 0 - 32 IU/L 73(H)  Alk Phosphatase 39 - 117 IU/L 50  Total Bilirubin 0.0 - 1.2 mg/dL 0.3      Depression screen [facility] 2/9 08/11/2019 07/22/2019 05/27/2019 11/12/2017  Decreased Interest 0 0 0 0  Down, Depressed, Hopeless 0 0 0 0  PHQ - 2 Score 0 0 0 0  Altered sleeping 0 0 0 -  Tired, decreased energy 0 0 0 -  Change in appetite 0 0 0 -  Feeling bad or failure about yourself  0 0 0 -  Trouble concentrating 0 0 0 -  Moving slowly or  fidgety/restless 0 0 0 -  Suicidal thoughts 0 0 0 -  PHQ-9 Score 0 0 0 -  Difficult doing work/chores Not difficult at all - - -    GAD 7 : Generalized Anxiety Score 08/11/2019  Nervous, Anxious, on Edge 0  Control/stop worrying 0  Worry too much - different things 0  Trouble relaxing 0  Restless 1  Easily annoyed or irritable 0  Afraid - awful might happen 0  Total GAD 7 Score 1  Anxiety Difficulty Not difficult at all     Impression and Recommendations:    1. Hyperlipidemia, unspecified hyperlipidemia type   2. Current tobacco use   3. Vitamin D insufficiency   4. Family history of hyperlipidemia-  mom   5. Elevated liver enzymes   6. Tobacco abuse counseling   7. Chronic alcoholism ([place])     - Reviewed recent lab work (07/22/2019) in depth with patient today.  All lab work within normal limits unless otherwise noted.  Extensive education provided and all questions answered.  Elevated Liver Enzymes - ALT; history of Chronic Alcoholism- NOW SOBER - Patient with history of heavy alcohol use, now sober.  - AST = 33, WNL. - ALT = 73, elevated.  - Discussed that liver enzymes may go up with obesity, alcohol intake, tylenol use / use of hepatotoxic substances, or a history of heavy alcohol use.  - Advised patient to continue to avoid substances that may irritate her liver.  - Told patient she may take milk thistle for liver supplementation, and to update clinic if she decides to try this.  - Will continue to monitor and re-check as discussed.  Vitamin D Insufficiency - Last measured at 38.3, insufficient. - Patient managed on supplementation.  - Discussed ideal range of 40-60. - Patient will check dosage of Vitamin D at home. - Increase to 4000 if taking 2000, and if taking 5000, will call for further advice.  - Once-weekly Vitamin D sent in; patient may pick up if needed.  See med list.  - Encouraged patient to engage in prudent sun exposure as advised.  - Will  continue to monitor and re-check end of March as discussed.  Hyperlipidemia - Family Hx Hyperlipidemia in Mom - Triglycerides = 122 - HDL = 51 - LDL = 128, elevated. -Treatment is diet and lifestyle modification. - Discussed that patient's LDL is elevated. - Extensive education provided today, and all questions answered.  - Dietary changes such as low saturated & trans fat diets for hyperlipidemia and low carb diets for hypertriglyceridemia discussed with patient.    - Encouraged patient to follow AHA guidelines for regular exercise and also engage in weight loss  if BMI above 25.   - Educational handouts provided at patient's desire and/ or told to look online at the [place] website for further information.  - We will continue to monitor  Current Tobacco Use - Smoker Told pt to think seriously about quitting smoking!  Told pt it is very important for his/her health and well being.  - Smoking cessation instruction/ counseling given of at least 5 minutes:  counseled patient on the dangers of tobacco use and reviewed strategies to maximize success - Discussed with patient that there are multiple treatments to aid in quitting smoking, however I explained none will work unless pt really wants to quit - Told to call 1-800-QUIT-NOW (937) 335-4566) for free smoking cessation counseling and support, or pt can go online to www.heart.org - the American Heart Association website and search "quit smoking ".   Recommendations - Re-check FLP, Vitamin D and ALT in 3 months after taking prescription Vitamin D and OTC milk thistle, which may help with elevated ALT and mild hepatitis from her history of chronic alcoholism and heavy use.  - As part of my medical decision making, I reviewed the following data within the [place] History obtained from pt /family, CMA notes reviewed and incorporated if applicable, Labs reviewed, Radiograph/ tests reviewed if applicable and  OV notes from prior OV's with me, as well as other specialists she/he has seen since seeing me last, were all reviewed and used in my medical decision making process today.   - Additionally, discussion had with patient regarding txmnt plan, their biases about that plan etc were used in my medical decision making today.   - The patient agreed with the plan and demonstrated an understanding of the instructions.   No barriers to understanding were identified.    Return for re-check FLP, Vit D and ALT in 3 months after Rx Vit D and milk thistle.    Orders Placed This Encounter  Procedures  . Lipid panel  . ALT  . VITAMIN D 25 Hydroxy (Vit-D Deficiency, Fractures)    Meds ordered this encounter  Medications  . Vitamin D, Ergocalciferol, (DRISDOL) 1.25 MG (50000 UT) CAPS capsule    Sig: Take one tablet wkly    Dispense:  12 capsule    Refill:  3    Note:  This note was prepared with assistance of Dragon voice recognition software. Occasional wrong-word or sound-a-like substitutions may have occurred due to the inherent limitations of voice recognition software.   This document serves as a record of services personally performed by Mellody Dance, DO. It was created on her behalf by Toni Amend, a trained medical scribe. The creation of this record is based on the scribe's personal observations and the provider's statements to them.   This case required medical decision making of at least moderate complexity. The above documentation has been reviewed to be accurate and was completed by Marjory Sneddon, D.O.       Patient Care Team    Relationship Specialty Notifications Start End  Mellody Dance, DO PCP - General Family Medicine  05/27/19   [facility] [place]    05/27/19    Comment: Marshia Ly, MD  Gastroenterology  05/27/19   [facility]  05/27/19   Gae Dry, MD Referring Physician Obstetrics and Gynecology  07/22/19      -Vitals obtained; medications/ allergies reconciled;  personal medical, social, Sx etc.histories were updated by CMA, reviewed by  me and are reflected in chart  Patient Active Problem List   Diagnosis Date Noted  . Chronic alcoholism ([place]) 05/30/2019  . Current tobacco use 04/21/2009  . h/o Hypercholesterolemia 11/12/2017  . Chronic R-sided back pain greater than 3 months duration 05/30/2019  . Spondylisthesis 05/27/2019  . IBS (irritable bowel syndrome) 11/16/2009  . Elevated liver enzymes 08/11/2019  . Vitamin D insufficiency 08/11/2019  . Tobacco abuse counseling 05/30/2019  . Family history of drug/alcohol addiction 05/30/2019  . Family history of depression- in entire maternal lineage 05/30/2019  . Family history of malignant melanoma- paternal grandfather 05/30/2019  . Family history of hyperlipidemia-  mom 05/30/2019  . Plantar warts 05/30/2019  . Overweight (BMI 25.0-29.9) 05/30/2019  . Abortion, spontaneous 11/12/2017  . Menorrhagia with regular cycle 11/07/2017  . Asthma 04/21/2009     Current Meds  Medication Sig  . b complex vitamins tablet Take 1 tablet by mouth daily.  Marland Kitchen BIOTIN PO Take 5,000 mcg by mouth daily.  . cetirizine (ZYRTEC) 5 MG tablet Take 5 mg by mouth daily.  Marland Kitchen doxycycline (VIBRAMYCIN) 100 MG capsule Take 1 capsule (100 mg total) by mouth 2 (two) times daily.  Marland Kitchen levonorgestrel (MIRENA) 20 MCG/24HR IUD 1 each by Intrauterine route once.  Marland Kitchen MAGNESIUM CITRATE PO Take 150 mg by mouth daily.  . metroNIDAZOLE (FLAGYL) 500 MG tablet Take 1 tablet (500 mg total) by mouth 3 (three) times daily for 10 days.  . Multiple Vitamin (MULTIVITAMIN) capsule Take 1 capsule by mouth daily.  . Probiotic Product (PROBIOTIC DAILY PO) Take by mouth 2 (two) times daily.  . sertraline (ZOLOFT) 50 MG tablet Take 1 tablet (50 mg total) by mouth at bedtime.  . TURMERIC PO Take by mouth. 500-1070m daily  . Valerian Root 530 MG CAPS Take by mouth daily.  .Marland KitchenVITAMIN D PO Take  50 Int'l Units by mouth daily.     Allergies  Allergen Reactions  . Penicillins      ROS:  See above HPI for pertinent positives and negatives   Objective:   Temperature 98 F (36.7 C), temperature source Oral, height 5' 5"  (1.651 m), weight 158 lb (71.7 kg).  (if some vitals are omitted, this means that patient was UNABLE to obtain them even though they were asked to get them prior to OV today.  They were asked to call [place] their earliest convenience with these once obtained.)  General: A & O * 3; visually in no acute distress; in usual state of health.  Skin: Visible skin appears normal and pt's usual skin color HEENT:  EOMI, head is normocephalic and atraumatic.  Sclera are anicteric. Neck has a good range of motion.  Lips are noncyanotic Chest: normal chest excursion and movement Respiratory: speaking in full sentences, no conversational dyspnea; no use of accessory muscles Psych: insight good, mood- appears full

## 2019-10-24 ENCOUNTER — Ambulatory Visit: Payer: Self-pay | Attending: Internal Medicine

## 2019-10-24 DIAGNOSIS — Z23 Encounter for immunization: Secondary | ICD-10-CM | POA: Insufficient documentation

## 2019-10-24 NOTE — Progress Notes (Signed)
   Covid-19 Vaccination Clinic  Name:  DEZIRAE [facility]    MRN: [id number] DOB: 11-28-1990  10/24/2019  Ms. Gillyard was observed post Covid-19 immunization for 15 minutes without incident. She was provided with Vaccine Information Sheet and instruction to access the V-Safe system.   Ms. Tomlin was instructed to call 911 with any severe reactions post vaccine: Marland Kitchen Difficulty breathing  . Swelling of face and throat  . A fast heartbeat  . A bad rash all over body  . Dizziness and weakness   Immunizations Administered    Name Date Dose VIS Date Route   Pfizer COVID-19 Vaccine 10/24/2019 11:45 AM 0.3 mL 07/31/2019 Intramuscular   Manufacturer: [employer], [employer]   Lot: [id number]   NDC: [address]-1

## 2019-11-14 ENCOUNTER — Ambulatory Visit: Payer: Self-pay | Attending: Internal Medicine

## 2019-11-14 DIAGNOSIS — Z23 Encounter for immunization: Secondary | ICD-10-CM

## 2019-11-14 NOTE — Progress Notes (Signed)
   Covid-19 Vaccination Clinic  Name:  April Harris    MRN: [id number] DOB: October 24, 1990  11/14/2019  [name] was observed post Covid-19 immunization for 15 minutes without incident. She was provided with Vaccine Information Sheet and instruction to access the V-Safe system.   [name] was instructed to call 911 with any severe reactions post vaccine: Marland Kitchen Difficulty breathing  . Swelling of face and throat  . A fast heartbeat  . A bad rash all over body  . Dizziness and weakness   Immunizations Administered    Name Date Dose VIS Date Route   Pfizer COVID-19 Vaccine 11/14/2019 12:53 PM 0.3 mL 07/31/2019 Intramuscular   Manufacturer: [employer], [employer]   Lot: [id number]   NDC: [address]-2

## 2019-11-24 ENCOUNTER — Ambulatory Visit: Payer: Self-pay

## 2019-12-03 ENCOUNTER — Other Ambulatory Visit: Payer: Self-pay | Admitting: Family Medicine

## 2019-12-03 DIAGNOSIS — F418 Other specified anxiety disorders: Secondary | ICD-10-CM

## 2020-03-02 ENCOUNTER — Other Ambulatory Visit: Payer: Self-pay | Admitting: Family Medicine

## 2020-03-02 DIAGNOSIS — F418 Other specified anxiety disorders: Secondary | ICD-10-CM

## 2020-03-04 ENCOUNTER — Encounter: Payer: Self-pay | Admitting: Obstetrics

## 2020-03-04 ENCOUNTER — Ambulatory Visit (INDEPENDENT_AMBULATORY_CARE_PROVIDER_SITE_OTHER): Payer: Managed Care, Other (non HMO) | Admitting: Obstetrics

## 2020-03-04 ENCOUNTER — Other Ambulatory Visit: Payer: Self-pay

## 2020-03-04 VITALS — BP 120/70 | Ht 65.0 in | Wt 177.0 lb

## 2020-03-04 DIAGNOSIS — N949 Unspecified condition associated with female genital organs and menstrual cycle: Secondary | ICD-10-CM

## 2020-03-04 DIAGNOSIS — Z30431 Encounter for routine checking of intrauterine contraceptive device: Secondary | ICD-10-CM

## 2020-03-04 NOTE — Progress Notes (Signed)
Obstetrics & Gynecology Office Visit   Chief Complaint:  Chief Complaint  Patient presents with   Vaginal Exam    internal pea size lump, discomfort, pelvic and back pain for the past 2 weeks    History of Present Illness: April Harris presents for evaluation of a lump that she felt in her vaginal this week. She was checking herself for IUD strings and felt something on the inner left side of her vagina. She also reports feeling bilateral adnexal pain midcycle for several months. She wants her IUD strings checked. She acknowledges that she is due for an annual exam.   Review of Systems:  ROS See below for pertinents  Past Medical History:  Past Medical History:  Diagnosis Date   Asthma    IBS (irritable bowel syndrome)    LGSIL on Pap smear of cervix     Past Surgical History:  Past Surgical History:  Procedure Laterality Date   COLPOSCOPY     DILATION AND CURETTAGE OF UTERUS     INDUCED ABORTION     TONSILLECTOMY      Gynecologic History: No LMP recorded. (Menstrual status: IUD).  Obstetric History: G2P0020  Family History:  Family History  Problem Relation Age of Onset   Heart attack Maternal Grandfather    Leukemia Maternal Grandfather    Depression Mother    Hyperlipidemia Mother    Depression Maternal Grandmother    Hypertension Paternal Grandmother    Melanoma Paternal Grandfather     Social History:  Social History   Socioeconomic History   Marital status: Single    Spouse name: Not on file   Number of children: 0   Years of education: Bachelors   Highest education level: Not on file  Occupational History   Not on file  Tobacco Use   Smoking status: Current Every Day Smoker    Packs/day: 0.25    Years: 14.00    Pack years: 3.50    Types: Cigarettes    Start date: 08/20/2004   Smokeless tobacco: Never Used  Vaping Use   Vaping Use: Never used  Substance and Sexual Activity   Alcohol use: Yes   Drug use: No   Sexual  activity: Yes    Birth control/protection: I.U.D.    Comment: Mirena  Other Topics Concern   Not on file  Social History Narrative   Not on file   Social Determinants of Health   Financial Resource Strain:    Difficulty of Paying Living Expenses:   Food Insecurity:    Worried About Programme researcher, broadcasting/film/video in the Last Year:    Barista in the Last Year:   Transportation Needs:    Freight forwarder (Medical):    Lack of Transportation (Non-Medical):   Physical Activity:    Days of Exercise per Week:    Minutes of Exercise per Session:   Stress:    Feeling of Stress :   Social Connections:    Frequency of Communication with Friends and Family:    Frequency of Social Gatherings with Friends and Family:    Attends Religious Services:    Active Member of Clubs or Organizations:    Attends Banker Meetings:    Marital Status:   Intimate Partner Violence:    Fear of Current or Ex-Partner:    Emotionally Abused:    Physically Abused:    Sexually Abused:     Allergies:  Allergies  Allergen Reactions   Penicillins  Medications: Prior to Admission medications   Medication Sig Start Date End Date Taking? Authorizing Provider  b complex vitamins tablet Take 1 tablet by mouth daily.   Yes [provider]  BIOTIN PO Take 5,000 mcg by mouth daily.   Yes [provider]  cetirizine (ZYRTEC) 5 MG tablet Take 5 mg by mouth daily.   Yes [provider]  levonorgestrel (MIRENA) 20 MCG/24HR IUD 1 each by Intrauterine route once.   Yes [provider]  MAGNESIUM CITRATE PO Take 150 mg by mouth daily.   Yes [provider]  Multiple Vitamin (MULTIVITAMIN) capsule Take 1 capsule by mouth daily.   Yes [provider]  sertraline (ZOLOFT) 50 MG tablet Take 1 tablet (50 mg total) by mouth at bedtime. 02/09/19  Yes Chrismon, Jodell Cipro, PA  TURMERIC PO Take by mouth. 500-1000mg  daily   Yes [provider]  Valerian Root 530 MG CAPS Take by mouth daily.   Yes [provider]  VITAMIN D PO Take 50 Int'l Units by mouth daily.   Yes [provider]  Vitamin D, Ergocalciferol, (DRISDOL) 1.25 MG (50000 UT) CAPS capsule Take one tablet wkly 08/11/19  Yes Opalski, Deborah, DO  Probiotic Product (PROBIOTIC DAILY PO) Take by mouth 2 (two) times daily. Patient not taking: Reported on 03/04/2020    [provider]    Physical Exam Vitals:  Vitals:   03/04/20 1434  BP: 120/70   No LMP recorded. (Menstrual status: IUD).  Physical Exam reserved to the pelvic area: Mons- normal hair distribution, no rashes, lesions or areas of irritation. Skenes glands without swelling Normal labia Vagina- mucosa pink with rugae, no discharge noted, Cervis ix visualized and IUD strings are in place, protruding 2 cms from cervical os. Bimanual: uterus is non enlarged, anteverted, no anexal tenderness or enlargement. No CMT Along left side of vagina, a small section of hymenal ring is identified. No lumps,k raised areas or palpable masses noted on exam. Perianal area is without lesions, irritation    Assessment: 29 y.o. G2P0020  For evaluation of vaginal "bump" IUD string check   Plan: Problem List Items Addressed This Visit    None    Visit Diagnoses    Vaginal lump    -  Primary   IUD check up         Patient is reassured regarding her IUD and the normal findings on Pelvic and vaiginal exam She is encouraged to RTC for a GYN physical.

## 2020-04-04 ENCOUNTER — Ambulatory Visit (INDEPENDENT_AMBULATORY_CARE_PROVIDER_SITE_OTHER): Payer: Managed Care, Other (non HMO) | Admitting: Obstetrics

## 2020-04-04 ENCOUNTER — Encounter: Payer: Self-pay | Admitting: Obstetrics

## 2020-04-04 ENCOUNTER — Other Ambulatory Visit: Payer: Self-pay

## 2020-04-04 ENCOUNTER — Other Ambulatory Visit (HOSPITAL_COMMUNITY)
Admission: RE | Admit: 2020-04-04 | Discharge: 2020-04-04 | Disposition: A | Discharge status: Home / Self Care | Payer: Managed Care, Other (non HMO) | Source: Ambulatory Visit | Attending: Obstetrics | Admitting: Obstetrics

## 2020-04-04 VITALS — BP 118/74 | Ht 65.0 in | Wt 184.0 lb

## 2020-04-04 DIAGNOSIS — Z124 Encounter for screening for malignant neoplasm of cervix: Secondary | ICD-10-CM | POA: Diagnosis not present

## 2020-04-04 DIAGNOSIS — Z01419 Encounter for gynecological examination (general) (routine) without abnormal findings: Secondary | ICD-10-CM | POA: Insufficient documentation

## 2020-04-04 DIAGNOSIS — Z113 Encounter for screening for infections with a predominantly sexual mode of transmission: Secondary | ICD-10-CM | POA: Diagnosis not present

## 2020-04-04 NOTE — Addendum Note (Signed)
Addended by: Mirna Mires on: 04/04/2020 05:58 PM   Modules accepted: Orders

## 2020-04-04 NOTE — Progress Notes (Signed)
.      Gynecology Annual Exam  PCP: Mayer Masker, PA-C  Chief Complaint:  Chief Complaint  Patient presents with  . Annual Exam    History of Present Illness: April Harris is a 29 y.o. G2P0020 presents for annual exam. The patient has no complaints today.  Her menses are absent as she has an IUD in place. Her flow is nonexistent secondary to her IUD. She does not have intermenstrual bleeding. Her last menstrual period was 04/22/2019. She denies dysmenorrhea. Last pap smear:  10/06/2018, results were NILM   The patient is  sexually active. She currently uses an IUD for contraception. She does not have dyspareunia.  Since her last visit, she has had no significant changes in her health.  Her past medical history is remarkable for See problem list: hx of substance abuse,chronic ETOH abuse, IBS, Spondylisthesis, and hypercholesterolimia.  She admits to regular MJ use for her IBS.  The patient does perform self breast exams. Her last mammogram was NA due to hr age.   There is no family history of breast cancer. Genetic testing has not been done.   There is no family history of ovarian cancer. Genetic testing has not been done.  The patient having cut back on cigarettes, but uses marijuana regularly  to address chronic IBS  She reports drinking alcohol. She reports have a rare drinks per week.   She reports illegal drug use. She uses MJ  The patient reports exercising regularly.  The patient denies current symptoms of depression.    Review of Systems: Review of Systems  Constitutional: Negative.   HENT: Negative.   Eyes: Negative.   Respiratory: Negative.   Cardiovascular: Negative.   Gastrointestinal: Negative.   Genitourinary: Negative.   Musculoskeletal: Negative.   Skin: Negative.   Neurological: Negative.   Endo/Heme/Allergies: Negative.   Psychiatric/Behavioral: Negative.     Past Medical History:  Past Medical History:  Diagnosis Date  . Asthma     . IBS (irritable bowel syndrome)   . LGSIL on Pap smear of cervix     Past Surgical History:  Past Surgical History:  Procedure Laterality Date  . COLPOSCOPY    . DILATION AND CURETTAGE OF UTERUS    . INDUCED ABORTION    . TONSILLECTOMY      Family History:  Family History  Problem Relation Age of Onset  . Heart attack Maternal Grandfather   . Leukemia Maternal Grandfather   . Depression Mother   . Hyperlipidemia Mother   . Depression Maternal Grandmother   . Hypertension Paternal Grandmother   . Melanoma Paternal Grandfather     Social History:  Social History   Socioeconomic History  . Marital status: Single    Spouse name: Not on file  . Number of children: 0  . Years of education: Bachelors  . Highest education level: Not on file  Occupational History  . Not on file  Tobacco Use  . Smoking status: Former Smoker    Packs/day: 0.25    Years: 14.00    Pack years: 3.50    Types: Cigarettes    Start date: 08/20/2004  . Smokeless tobacco: Never Used  Vaping Use  . Vaping Use: Never used  Substance and Sexual Activity  . Alcohol use: Yes  . Drug use: No  . Sexual activity: Yes    Birth control/protection: I.U.D.    Comment: Mirena  Other Topics Concern  . Not on file  Social History Narrative  .  Not on file   Social Determinants of Health   Financial Resource Strain:   . Difficulty of Paying Living Expenses:   Food Insecurity:   . Worried About Programme researcher, broadcasting/film/video in the Last Year:   . Barista in the Last Year:   Transportation Needs:   . Freight forwarder (Medical):   Marland Kitchen Lack of Transportation (Non-Medical):   Physical Activity:   . Days of Exercise per Week:   . Minutes of Exercise per Session:   Stress:   . Feeling of Stress :   Social Connections:   . Frequency of Communication with Friends and Family:   . Frequency of Social Gatherings with Friends and Family:   . Attends Religious Services:   . Active Member of Clubs or  Organizations:   . Attends Banker Meetings:   Marland Kitchen Marital Status:   Intimate Partner Violence:   . Fear of Current or Ex-Partner:   . Emotionally Abused:   Marland Kitchen Physically Abused:   . Sexually Abused:     Allergies:  Allergies  Allergen Reactions  . Penicillins     Medications: Prior to Admission medications   Medication Sig Start Date End Date Taking? Authorizing Provider  b complex vitamins tablet Take 1 tablet by mouth daily.    [provider]  BIOTIN PO Take 5,000 mcg by mouth daily.    [provider]  cetirizine (ZYRTEC) 5 MG tablet Take 5 mg by mouth daily.    [provider]  levonorgestrel (MIRENA) 20 MCG/24HR IUD 1 each by Intrauterine route once.    [provider]  MAGNESIUM CITRATE PO Take 150 mg by mouth daily.    [provider]  Multiple Vitamin (MULTIVITAMIN) capsule Take 1 capsule by mouth daily.    [provider]  Probiotic Product (PROBIOTIC DAILY PO) Take by mouth 2 (two) times daily. Patient not taking: Reported on 03/04/2020    [provider]  sertraline (ZOLOFT) 50 MG tablet Take 1 tablet (50 mg total) by mouth at bedtime. 02/09/19   Chrismon, Jodell Cipro, PA  TURMERIC PO Take by mouth. 500-1000mg  daily    [provider]  Valerian Root 530 MG CAPS Take by mouth daily.    [provider]  VITAMIN D PO Take 50 Int'l Units by mouth daily.    [provider]  Vitamin D, Ergocalciferol, (DRISDOL) 1.25 MG (50000 UT) CAPS capsule Take one tablet wkly 08/11/19   Thomasene Lot, DO    Physical Exam Vitals: Blood pressure 118/74, height 5\' 5"  (1.651 m), weight 184 lb (83.5 kg).  General: NAD HEENT: normocephalic, anicteric Neck: no thyroid enlargement, no palpable nodules, no cervical lymphadenopathy  Pulmonary: No increased work of breathing, CTAB Cardiovascular: RRR, without murmur  Breast: Breast symmetrical, no tenderness, no palpable nodules or masses, no  skin or nipple retraction present, no nipple discharge.  No axillary, infraclavicular or supraclavicular lymphadenopathy. Abdomen: Soft, non-tender, non-distended.  Umbilicus without lesions.  No hepatomegaly or masses palpable. No evidence of hernia. Genitourinary:  External: Normal external female genitalia.  Normal urethral meatus, normal  Bartholin's and Skene's glands.    Vagina: Normal vaginal mucosa, no evidence of prolapse.    Cervix: Grossly normal in appearance, no bleeding, non-tender  Uterus: Anteverted, normal size, shape, and consistency, mobile, and non-tender  Adnexa: No adnexal masses, non-tender  Rectal: deferred  Lymphatic: no evidence of inguinal lymphadenopathy Extremities: no edema, erythema, or tenderness Neurologic: Grossly intact Psychiatric: mood  appropriate, affect full     Assessment: 29 y.o. [id number] No problem-specific Assessment & Plan notes found for this encounter.   Plan:  Pap smear today.   1) Breast cancer screening - recommend monthly self breast exam. She will likel start mammograms at age 34 or 21  2) STI screening was offered and accepted.  3) Cervical cancer screening - Pap was done. ASCCP guidelines and rational discussed.  Patient opts for every 3 years screening interval  4) Contraception - She is happy with her IUD. 5) Routine healthcare maintenance including cholesterol and diabetes screening managed by PCP   RTC in 1 year or PRN.  Mirna Mires, CNM  04/04/2020 5:38 PM

## 2020-04-08 LAB — CYTOLOGY - PAP
Chlamydia: NEGATIVE
Comment: NEGATIVE
Comment: NEGATIVE
Comment: NEGATIVE
Comment: NORMAL
High risk HPV: POSITIVE — AB
Neisseria Gonorrhea: NEGATIVE
Trichomonas: NEGATIVE

## 2020-04-12 ENCOUNTER — Telehealth: Payer: Self-pay | Admitting: Obstetrics and Gynecology

## 2020-04-12 NOTE — Telephone Encounter (Signed)
-----   Message from Mirna Mires, CNM sent at 04/11/2020  4:46 PM EDT ----- Pap smear is LGSIL. I have contacted the patient by phone. She needs colposcopy and  would like to be scheduled with Jean Rosenthal or [place].  Thanks-

## 2020-04-12 NOTE — Telephone Encounter (Signed)
Patient is scheduled for 05/06/20 with SDJ

## 2020-05-06 ENCOUNTER — Ambulatory Visit (INDEPENDENT_AMBULATORY_CARE_PROVIDER_SITE_OTHER): Payer: Managed Care, Other (non HMO) | Admitting: Obstetrics and Gynecology

## 2020-05-06 ENCOUNTER — Other Ambulatory Visit: Payer: Self-pay

## 2020-05-06 ENCOUNTER — Other Ambulatory Visit (HOSPITAL_COMMUNITY)
Admission: RE | Admit: 2020-05-06 | Discharge: 2020-05-06 | Disposition: A | Discharge status: Home / Self Care | Payer: Managed Care, Other (non HMO) | Source: Ambulatory Visit | Attending: Obstetrics and Gynecology | Admitting: Obstetrics and Gynecology

## 2020-05-06 ENCOUNTER — Encounter: Payer: Self-pay | Admitting: Obstetrics and Gynecology

## 2020-05-06 VITALS — BP 120/80 | Ht 65.0 in | Wt 184.0 lb

## 2020-05-06 DIAGNOSIS — R87612 Low grade squamous intraepithelial lesion on cytologic smear of cervix (LGSIL): Secondary | ICD-10-CM | POA: Insufficient documentation

## 2020-05-06 NOTE — Progress Notes (Signed)
HPI:  April Harris is a 29 y.o.  G2P0020  who presents today for evaluation and management of abnormal cervical cytology.    Dysplasia History:   LGSIL, HPV+ (she states that she has a long history of +HPV for > 10 years)   OB History  Gravida Para Term Preterm AB Living  2       2    SAB TAB Ectopic Multiple Live Births  1 1          # Outcome Date GA Lbr Len/2nd Weight Sex Delivery Anes PTL Lv  2 TAB           1 SAB             Past Medical History:  Diagnosis Date  . Asthma   . IBS (irritable bowel syndrome)   . LGSIL on Pap smear of cervix     Past Surgical History:  Procedure Laterality Date  . COLPOSCOPY    . DILATION AND CURETTAGE OF UTERUS    . INDUCED ABORTION    . TONSILLECTOMY      SOCIAL HISTORY:  Social History   Substance and Sexual Activity  Alcohol Use Yes   Substance and Sexual Activity  Alcohol Use Yes     Substance and Sexual Activity  Drug Use No     Family History  Problem Relation Age of Onset  . Heart attack Maternal Grandfather   . Leukemia Maternal Grandfather   . Depression Mother   . Hyperlipidemia Mother   . Depression Maternal Grandmother   . Hypertension Paternal Grandmother   . Melanoma Paternal Grandfather     ALLERGIES:  Penicillins  Current Outpatient Medications on File Prior to Visit  Medication Sig Dispense Refill  . b complex vitamins tablet Take 1 tablet by mouth daily.    Marland Kitchen BIOTIN PO Take 5,000 mcg by mouth daily.    . cetirizine (ZYRTEC) 5 MG tablet Take 5 mg by mouth daily.    Marland Kitchen levonorgestrel (MIRENA) 20 MCG/24HR IUD 1 each by Intrauterine route once.    Marland Kitchen MAGNESIUM CITRATE PO Take 150 mg by mouth daily.    . Multiple Vitamin (MULTIVITAMIN) capsule Take 1 capsule by mouth daily.    . Probiotic Product (PROBIOTIC DAILY PO) Take by mouth 2 (two) times daily.     . sertraline (ZOLOFT) 50 MG tablet Take 1 tablet (50 mg total) by mouth at bedtime. 90 tablet 3  . TURMERIC PO Take by mouth. 500-1000mg   daily    . Valerian Root 530 MG CAPS Take by mouth daily.    Marland Kitchen VITAMIN D PO Take 50 Int'l Units by mouth daily.    . Vitamin D, Ergocalciferol, (DRISDOL) 1.25 MG (50000 UT) CAPS capsule Take one tablet wkly 12 capsule 3   No current facility-administered medications on file prior to visit.    Physical Exam: -Vitals:  BP 120/80   Ht 5\' 5"  (1.651 m)   Wt 184 lb (83.5 kg)   BMI 30.62 kg/m  GEN: WD, WN, NAD.  A+ O x 3, good mood and affect. ABD:  NT, ND.  Soft, no masses.  No hernias noted.   Pelvic:   Vulva: Normal appearance.  No lesions.  Vagina: No lesions or abnormalities noted.  Support: Normal pelvic support.  Urethra No masses tenderness or scarring.  Meatus Normal size without lesions or prolapse.  Cervix: See below.  Anus: Normal exam.  No lesions.  Perineum: Normal exam.  No lesions.  Bimanual   Uterus: Normal size.  Non-tender.  Mobile.  AV.  Adnexae: No masses.  Non-tender to palpation.  Cul-de-sac: Negative for abnormality.   PROCEDURE: 1.  Urine Pregnancy Test:  Not done due to Mirena IUD in place 2.  Colposcopy performed with 4% acetic acid after verbal consent obtained                                         -Aceto-white Lesions Location(s): mildly around small external cervical os.              -Biopsy performed at 5 and 12 o'clock               -ECC indicated and performed: Yes.       -Biopsy sites made hemostatic with pressure and/or Monsel's solution   -Satisfactory colposcopy: No.    -Evidence of Invasive cervical CA :  NO  ASSESSMENT:  April Harris is a 29 y.o. G2P0020 here for  1. LGSIL on Pap smear of cervix   .  PLAN:  I discussed the grading system of pap smears and HPV high risk viral types.  We will discuss and base management after colpo results return.      Thomasene Mohair, MD  [facility], [facility] 05/06/2020  2:42 PM

## 2020-05-10 ENCOUNTER — Telehealth: Payer: Self-pay | Admitting: Obstetrics and Gynecology

## 2020-05-10 DIAGNOSIS — N871 Moderate cervical dysplasia: Secondary | ICD-10-CM

## 2020-05-10 LAB — SURGICAL PATHOLOGY

## 2020-05-10 NOTE — Telephone Encounter (Signed)
Discussed CIN II on colposcopy biopsy. Discussed recommendation for LEEP procedure. Discussed risks/benefits of proceeding with LEEP procedure, including those related to adverse pregnancy outcomes.  Discussed attempting procedure with IUD in place vs removing IUD and replacing the IUD at her 4-week post-op follow up.  She would like to proceed with her IUD being removed.  All questions answered.

## 2020-05-12 ENCOUNTER — Telehealth: Payer: Self-pay | Admitting: Obstetrics and Gynecology

## 2020-05-12 NOTE — Telephone Encounter (Signed)
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

## 2020-05-12 NOTE — Telephone Encounter (Signed)
-----   Message from Conard Novak, MD sent at 05/10/2020 11:51 AM EDT ----- Regarding: Schedule surgery Surgery Booking Request Patient Full Name:  April Harris  MRN: [id number]  DOB: 01/18/1991  Surgeon: Thomasene Mohair, MD  Requested Surgery Date and Time: TBD Primary Diagnosis AND Code: CIN II [N87.1] Secondary Diagnosis and Code:  Surgical Procedure: LEEP RNFA Requested?: No L&D Notification: No Admission Status: IN-OFFICE Length of Surgery: 50 min Special Case Needs: No H&P: No Phone Interview???:  No Interpreter: No Medical Clearance:  No Special Scheduling Instructions: in office, reserve procedure room and about 40 minutes for procedure. Any known health/anesthesia issues, diabetes, sleep apnea, latex allergy, defibrillator/pacemaker?: No Acuity: P2   (P1 highest, P2 delay may cause harm, P3 low, elective gyn, P4 lowest)

## 2020-06-03 ENCOUNTER — Telehealth: Payer: Self-pay | Admitting: Obstetrics and Gynecology

## 2020-06-03 NOTE — Telephone Encounter (Signed)
Called pt back to adv her of cpt code for LEEP she is having done in office 11/1  Cpt [phone]

## 2020-06-14 ENCOUNTER — Telehealth: Payer: Self-pay | Admitting: Physician Assistant

## 2020-06-14 NOTE — Telephone Encounter (Signed)
06/14/2020  LVM for pt to call to discuss. Delton See, CMA

## 2020-06-14 NOTE — Telephone Encounter (Signed)
Patient is out of Zoloft and needs a refill. She is a former Dr. Val Eagle patient. I am not sure if she needs an appointment first so I thought I would check. If you can refill it please send to [facility] on [address].

## 2020-06-20 ENCOUNTER — Encounter: Payer: Self-pay | Admitting: Obstetrics and Gynecology

## 2020-06-20 ENCOUNTER — Other Ambulatory Visit: Payer: Self-pay

## 2020-06-20 ENCOUNTER — Ambulatory Visit (INDEPENDENT_AMBULATORY_CARE_PROVIDER_SITE_OTHER): Payer: PRIVATE HEALTH INSURANCE | Admitting: Obstetrics and Gynecology

## 2020-06-20 ENCOUNTER — Other Ambulatory Visit (HOSPITAL_COMMUNITY)
Admission: RE | Admit: 2020-06-20 | Discharge: 2020-06-20 | Disposition: A | Discharge status: Home / Self Care | Payer: Managed Care, Other (non HMO) | Source: Ambulatory Visit | Attending: Obstetrics and Gynecology | Admitting: Obstetrics and Gynecology

## 2020-06-20 VITALS — BP 118/74 | Ht 65.0 in | Wt 186.0 lb

## 2020-06-20 DIAGNOSIS — Z30432 Encounter for removal of intrauterine contraceptive device: Secondary | ICD-10-CM

## 2020-06-20 DIAGNOSIS — N871 Moderate cervical dysplasia: Secondary | ICD-10-CM | POA: Insufficient documentation

## 2020-06-20 NOTE — Progress Notes (Signed)
  LEEP PROCEDURE NOTE  The LEEP has been explained to the patient in detail; risks/benefits reviewed.  The risks include, but are not limited to, bleeding, infection, and the possibility of cervical stenosis or cervical incompetence.  The patient had previously been given information regarding abnormal PAP smears and their relationship to HPV.  We have discussed the natural course and history of HPV, the possibility of incomplete treatment by the LEEP, as well as the possibility of recurrence.  I have reviewed the consent form for LEEP with her, and she fully understands its contents.  We have discussed the procedure itself. I have informed her that following the LEEP she should refrain from intercourse and the use of tampons for three weeks, and that she should also expect some spotting and brown/black discharge over the next several days.  We have discussed the fact that vaginal bleeding, differentiated from spotting, is not normal and that if she should have this complication, she should contact me immediately.  The follow-up after LEEP will be PAP smears or viral typing performed at regular intervals for up to 3-5 years.  Should these all prove to be normal, she will then be back on typical cervical screening.  I have answered all of her questions, and I believe she has an adequate understanding of the LEEP, its implications, and the necessity of follow-up care.  I discussed her colpo results and explained the procedure of LEEP.  All questions were answered and she signed the consent form.    IUD Removal:  Patient identified, informed consent performed, consent signed.  Patient was in the dorsal lithotomy position, normal external genitalia was noted.  A speculum was placed in the patient's vagina, normal discharge was noted, no lesions. The cervix was visualized, no lesions, no abnormal discharge.  The strings of the IUD were grasped and pulled using ring forceps. The IUD was removed in its entirety.  Patient tolerated the procedure well.    LEEP Procedure: LEEP performed in the usual manner after reviewing the previous colpo findings and results. Colposcope used throughout the procedure to better identify the margins and areas of abnormality.  Lugol's solution was usesd to identify any abnormal areas of the cervix. There was minimal Lugol's negative area.  The cervix was cleansed with betadine solution. Local injection of Lidocaine was performed for anesthesia. Ectocervical and then endocervical specimens obtained using the loop electrodes without difficulty.  It was labeled accordingly. ECC performed.   The base and edges of the defect was then cauterized using coagulation current.  Once hemostasis was obtained, Monsel's solution was placed at the cone bed to maintain ongoing hemostasis.    The patient tolerated the procedure well.    Thomasene Mohair, MD 06/20/2020 5:05 PM

## 2020-06-22 ENCOUNTER — Telehealth: Payer: Self-pay

## 2020-06-22 NOTE — Telephone Encounter (Signed)
Pt calling; had LEEP on 11/1st; yesterday started having severe stomach upset - diarrhea/vomiting; nl or coincidence c LEEP?  [phone]  Adv stomach issue not related to LEEP; pt aware clear liquids x24hrs; then bland food; then work up to reg diet; pt aware to be seen if doesn't keep liquids down for 24hrs.

## 2020-06-23 LAB — SURGICAL PATHOLOGY

## 2020-07-04 ENCOUNTER — Other Ambulatory Visit: Payer: Self-pay

## 2020-07-04 ENCOUNTER — Ambulatory Visit (INDEPENDENT_AMBULATORY_CARE_PROVIDER_SITE_OTHER): Payer: Managed Care, Other (non HMO) | Admitting: Physician Assistant

## 2020-07-04 ENCOUNTER — Other Ambulatory Visit: Payer: Managed Care, Other (non HMO)

## 2020-07-04 ENCOUNTER — Encounter: Payer: Self-pay | Admitting: Physician Assistant

## 2020-07-04 VITALS — BP 109/65 | HR 67 | Temp 98.5°F | Ht 65.0 in | Wt 186.4 lb

## 2020-07-04 DIAGNOSIS — E559 Vitamin D deficiency, unspecified: Secondary | ICD-10-CM

## 2020-07-04 DIAGNOSIS — F418 Other specified anxiety disorders: Secondary | ICD-10-CM | POA: Diagnosis not present

## 2020-07-04 DIAGNOSIS — F1020 Alcohol dependence, uncomplicated: Secondary | ICD-10-CM | POA: Diagnosis not present

## 2020-07-04 DIAGNOSIS — N871 Moderate cervical dysplasia: Secondary | ICD-10-CM

## 2020-07-04 DIAGNOSIS — E785 Hyperlipidemia, unspecified: Secondary | ICD-10-CM

## 2020-07-04 DIAGNOSIS — E7800 Pure hypercholesterolemia, unspecified: Secondary | ICD-10-CM

## 2020-07-04 MED ORDER — SERTRALINE HCL 50 MG PO TABS: 50.0000 mg | ORAL_TABLET | Freq: Every day | ORAL | 1 refills | Status: DC

## 2020-07-04 NOTE — Progress Notes (Signed)
Established Patient Office Visit  Subjective:  Patient ID: April Harris, female    DOB: 1990-09-28  Age: 29 y.o. MRN: [id number]  CC:  Chief Complaint  Patient presents with  . Depression    HPI April Harris presents for follow up on mood management. Endorses depressive symptoms and would be interested on seeing a therapist and psychiatrist. Jamal Maes to wean off Sertraline but realized she needs to stay on medication. Reports remained sober from 01/12/2019 to 01/02/2020 and currently drinks a few times/wk. Limits herself to 2 drinks and usually has a drink with a meal and with someone else being present. Uses marijuana daily to help with mood, sleep and chronic pain.   Past Medical History:  Diagnosis Date  . Asthma   . H/O LEEP (loop electrosurgical excision procedure) of cervix complicating pregnancy   . History of colposcopy   . IBS (irritable bowel syndrome)   . LGSIL on Pap smear of cervix     Past Surgical History:  Procedure Laterality Date  . COLPOSCOPY    . DILATION AND CURETTAGE OF UTERUS    . INDUCED ABORTION    . TONSILLECTOMY      Family History  Problem Relation Age of Onset  . Heart attack Maternal Grandfather   . Leukemia Maternal Grandfather   . Depression Mother   . Hyperlipidemia Mother   . Depression Maternal Grandmother   . Hypertension Paternal Grandmother   . Melanoma Paternal Grandfather     Social History   Socioeconomic History  . Marital status: Single    Spouse name: Not on file  . Number of children: 0  . Years of education: Bachelors  . Highest education level: Not on file  Occupational History  . Not on file  Tobacco Use  . Smoking status: Former Smoker    Packs/day: 0.25    Years: 14.00    Pack years: 3.50    Types: Cigarettes    Start date: 08/20/2004  . Smokeless tobacco: Never Used  Vaping Use  . Vaping Use: Never used  Substance and Sexual Activity  . Alcohol use: Yes  . Drug use: No  . Sexual activity: Not  Currently    Birth control/protection: I.U.D.    Comment: Mirena  Other Topics Concern  . Not on file  Social History Narrative  . Not on file   Social Determinants of Health   Financial Resource Strain:   . Difficulty of Paying Living Expenses: Not on file  Food Insecurity:   . Worried About Programme researcher, broadcasting/film/video in the Last Year: Not on file  . Ran Out of Food in the Last Year: Not on file  Transportation Needs:   . Lack of Transportation (Medical): Not on file  . Lack of Transportation (Non-Medical): Not on file  Physical Activity:   . Days of Exercise per Week: Not on file  . Minutes of Exercise per Session: Not on file  Stress:   . Feeling of Stress : Not on file  Social Connections:   . Frequency of Communication with Friends and Family: Not on file  . Frequency of Social Gatherings with Friends and Family: Not on file  . Attends Religious Services: Not on file  . Active Member of Clubs or Organizations: Not on file  . Attends Banker Meetings: Not on file  . Marital Status: Not on file  Intimate Partner Violence:   . Fear of Current or Ex-Partner: Not on file  .  Emotionally Abused: Not on file  . Physically Abused: Not on file  . Sexually Abused: Not on file    Outpatient Medications Prior to Visit  Medication Sig Dispense Refill  . cetirizine (ZYRTEC) 5 MG tablet Take 5 mg by mouth daily.    Marland Kitchen MAGNESIUM CITRATE PO Take 150 mg by mouth daily.    . Multiple Vitamin (MULTIVITAMIN) capsule Take 1 capsule by mouth daily.    Gerarda Fraction Root 530 MG CAPS Take by mouth daily.    Marland Kitchen VITAMIN D PO Take 50 Int'l Units by mouth daily.    . Vitamin D, Ergocalciferol, (DRISDOL) 1.25 MG (50000 UT) CAPS capsule Take one tablet wkly 12 capsule 3  . sertraline (ZOLOFT) 50 MG tablet Take 1 tablet (50 mg total) by mouth at bedtime. 90 tablet 3  . b complex vitamins tablet Take 1 tablet by mouth daily.    Marland Kitchen BIOTIN PO Take 5,000 mcg by mouth daily. (Patient not taking:  Reported on 06/20/2020)    . Probiotic Product (PROBIOTIC DAILY PO) Take by mouth 2 (two) times daily.  (Patient not taking: Reported on 06/20/2020)    . TURMERIC PO Take by mouth. 500-1000mg  daily (Patient not taking: Reported on 06/20/2020)     No facility-administered medications prior to visit.    Allergies  Allergen Reactions  . Penicillins     ROS Review of Systems A fourteen system review of systems was performed and found to be positive as per HPI.   Objective:    Physical Exam General:  Well Developed, well nourished, appropriate for stated age.  Neuro:  Alert and oriented,  extra-ocular muscles intact  HEENT:  Normocephalic, atraumatic, neck supple Skin:  no gross rash, warm, pink. Cardiac:  RRR, S1 S2 Respiratory:  ECTA B/L and A/P, Not using accessory muscles, speaking in full sentences- unlabored. Vascular:  Ext warm, no cyanosis apprec.; cap RF less 2 sec. Psych:  No HI/SI, judgement and insight good, depressed mood. Full Affect.   BP 109/65   Pulse 67   Temp 98.5 F (36.9 C) (Oral)   Ht 5\' 5"  (1.651 m)   Wt 186 lb 6.4 oz (84.6 kg)   SpO2 96% Comment: on RA  BMI 31.02 kg/m  Wt Readings from Last 3 Encounters:  07/04/20 186 lb 6.4 oz (84.6 kg)  06/20/20 186 lb (84.4 kg)  05/06/20 184 lb (83.5 kg)     Health Maintenance Due  Topic Date Due  . Hepatitis C Screening  Never done  . INFLUENZA VACCINE  03/20/2020    There are no preventive care reminders to display for this patient.  Lab Results  Component Value Date   TSH 1.760 07/22/2019   Lab Results  Component Value Date   WBC 7.6 07/22/2019   HGB 14.1 07/22/2019   HCT 42.7 07/22/2019   MCV 93 07/22/2019   PLT 187 07/22/2019   Lab Results  Component Value Date   NA 139 07/22/2019   K 4.9 07/22/2019   CO2 21 07/22/2019   GLUCOSE 88 07/22/2019   BUN 15 07/22/2019   CREATININE 0.85 07/22/2019   BILITOT 0.3 07/22/2019   ALKPHOS 50 07/22/2019   AST 33 07/22/2019   ALT 73 (H) 07/22/2019    PROT 7.0 07/22/2019   ALBUMIN 4.7 07/22/2019   CALCIUM 9.7 07/22/2019   Lab Results  Component Value Date   CHOL 201 (H) 07/22/2019   Lab Results  Component Value Date   HDL 51 07/22/2019   Lab  Results  Component Value Date   LDLCALC 128 (H) 07/22/2019   Lab Results  Component Value Date   TRIG 122 07/22/2019   Lab Results  Component Value Date   CHOLHDL 3.9 07/22/2019   Lab Results  Component Value Date   HGBA1C 5.3 07/22/2019      Assessment & Plan:   Problem List Items Addressed This Visit      Other   Chronic alcoholism (HCC) (Chronic)   Vitamin D insufficiency    Other Visit Diagnoses    Depression with anxiety    -  Primary   Relevant Medications   sertraline (ZOLOFT) 50 MG tablet   Other Relevant Orders   Ambulatory referral to Psychiatry   Ambulatory referral to Psychology   Hyperlipidemia, unspecified hyperlipidemia type       CIN II (cervical intraepithelial neoplasia II)         Depression with anxiety: -PHQ-9 score of 17, denies SI/HI. -Patient is agreeable to psychology and psychiatry referral so will place referrals. -Recommend to resume sertraline daily and will send refills. -Encourage to incorporate nonpharmacological therapy such as exercise and use good support system.  Chronic alcoholism: -Patient remained sober for almost a year and recommend to continue with monitoring/limiting alcohol use, eventually be able to quit again. -Improving mood will likely help with alcohol consumption.  -Recommend to avoid substance use.   Vitamin D insufficiency: -Last Vitamin D 38.3 -Patient has blood drawn this morning. Pending Vitamin D results will send additional Vit D refills.  Hyperlipidemia, unspecified hyperlipidemia type: -Last lipid panel: total cholesterol 201, triglycerides 122, HDL 51, LDL 128 -Recommend to follow a diet low in saturated and trans fats.  -Will notify of lipid panel results once available.   CIN II: -Followed by  Ob-Gyn. -LEEP procedure and IUD removal 06/20/2020   Meds ordered this encounter  Medications  . sertraline (ZOLOFT) 50 MG tablet    Sig: Take 1 tablet (50 mg total) by mouth at bedtime.    Dispense:  90 tablet    Refill:  1    Order Specific Question:   Supervising Provider    Answer:   Nani Gasser D [[id number]]    Follow-up: Return in about 3 months (around 10/04/2020) for Mood.   Note:  This note was prepared with assistance of Dragon voice recognition software. Occasional wrong-word or sound-a-like substitutions may have occurred due to the inherent limitations of voice recognition software.  Mayer Masker, PA-C

## 2020-07-04 NOTE — Patient Instructions (Signed)

## 2020-07-05 ENCOUNTER — Other Ambulatory Visit: Payer: Self-pay | Admitting: Physician Assistant

## 2020-07-05 DIAGNOSIS — E559 Vitamin D deficiency, unspecified: Secondary | ICD-10-CM

## 2020-07-05 LAB — LIPID PANEL
Chol/HDL Ratio: 6.3 ratio — ABNORMAL HIGH (ref 0.0–4.4)
Cholesterol, Total: 228 mg/dL — ABNORMAL HIGH (ref 100–199)
HDL: 36 mg/dL — ABNORMAL LOW (ref >39)
LDL Chol Calc (NIH): 174 mg/dL — ABNORMAL HIGH (ref 0–99)
Triglycerides: 101 mg/dL (ref 0–149)
VLDL Cholesterol Cal: 18 mg/dL (ref 5–40)

## 2020-07-05 LAB — VITAMIN D 25 HYDROXY (VIT D DEFICIENCY, FRACTURES): Vit D, 25-Hydroxy: 48.2 ng/mL (ref 30.0–100.0)

## 2020-07-05 MED ORDER — VITAMIN D (ERGOCALCIFEROL) 1.25 MG (50000 UNIT) PO CAPS: ORAL_CAPSULE | ORAL | 1 refills | Status: DC

## 2020-07-18 ENCOUNTER — Ambulatory Visit (INDEPENDENT_AMBULATORY_CARE_PROVIDER_SITE_OTHER): Payer: Managed Care, Other (non HMO) | Admitting: Obstetrics and Gynecology

## 2020-07-18 ENCOUNTER — Other Ambulatory Visit: Payer: Self-pay

## 2020-07-18 ENCOUNTER — Encounter: Payer: Self-pay | Admitting: Obstetrics and Gynecology

## 2020-07-18 VITALS — BP 124/74 | Wt 186.0 lb

## 2020-07-18 DIAGNOSIS — N871 Moderate cervical dysplasia: Secondary | ICD-10-CM

## 2020-07-18 NOTE — Progress Notes (Signed)
   Postoperative Follow-up Patient presents post op from LEEP 1 month ago for CIN II.  Subjective: Eating a regular diet without difficulty. The patient is not having any pain.  Activity: normal activities of daily living.  FINAL MICROSCOPIC DIAGNOSIS:   A. ECTOCERVIX, LEEP:  - Low grade squamous intraepithelial lesion (CIN 1, mild dysplasia)  - Margins uninvolved by dysplasia  - See comment   B. ENDOCERVIX, LEEP:  - Benign endocervical tissue  - No malignancy identified   C. ENDOCERVIX, CURETTAGE:  - Mucoinflammatory debris with benign endocervical glandular epithelium   No malignancy identified   Objective: Vitals:   07/18/20 1511  BP: 124/74   Vital Signs: BP 124/74   Wt 186 lb (84.4 kg)   BMI 30.95 kg/m  Constitutional: Well nourished, well developed female in no acute distress.  HEENT: normal Skin: Warm and dry.  Extremity: no edema  Abdomen: Soft, non-tender, normal bowel sounds; no bruits, organomegaly or masses.  Pelvic exam: (female chaperone present) is not limited by body habitus EGBUS: within normal limits Vagina: within normal limits and with normal mucosa  Cervix: normal, LEEP bed well-healed.   Female chaperone present for pelvic exam:   Assessment: 29 y.o. s/p LEEP progressing well  Plan: Patient has done well after surgery with no apparent complications.  I have discussed the post-operative course to date, and the expected progress moving forward.  The patient understands what complications to be concerned about.  I will see the patient in routine follow up, or sooner if needed.    Activity plan: No restriction.  Follow up pap smear in one year.  Thomasene Mohair, MD 07/18/2020, 3:36 PM

## 2020-08-04 ENCOUNTER — Ambulatory Visit (INDEPENDENT_AMBULATORY_CARE_PROVIDER_SITE_OTHER): Payer: Self-pay | Admitting: Psychology

## 2020-08-04 DIAGNOSIS — F331 Major depressive disorder, recurrent, moderate: Secondary | ICD-10-CM

## 2020-08-04 DIAGNOSIS — F411 Generalized anxiety disorder: Secondary | ICD-10-CM

## 2020-08-25 ENCOUNTER — Ambulatory Visit (INDEPENDENT_AMBULATORY_CARE_PROVIDER_SITE_OTHER): Payer: Self-pay | Admitting: Psychology

## 2020-08-25 DIAGNOSIS — F331 Major depressive disorder, recurrent, moderate: Secondary | ICD-10-CM

## 2020-08-25 DIAGNOSIS — F411 Generalized anxiety disorder: Secondary | ICD-10-CM

## 2020-09-08 ENCOUNTER — Ambulatory Visit (INDEPENDENT_AMBULATORY_CARE_PROVIDER_SITE_OTHER): Payer: Self-pay | Admitting: Psychology

## 2020-09-08 DIAGNOSIS — F411 Generalized anxiety disorder: Secondary | ICD-10-CM

## 2020-09-08 DIAGNOSIS — F331 Major depressive disorder, recurrent, moderate: Secondary | ICD-10-CM

## 2020-09-22 ENCOUNTER — Ambulatory Visit (INDEPENDENT_AMBULATORY_CARE_PROVIDER_SITE_OTHER): Payer: Self-pay | Admitting: Psychology

## 2020-09-22 DIAGNOSIS — F411 Generalized anxiety disorder: Secondary | ICD-10-CM

## 2020-09-22 DIAGNOSIS — F331 Major depressive disorder, recurrent, moderate: Secondary | ICD-10-CM

## 2020-10-04 ENCOUNTER — Encounter: Payer: Self-pay | Admitting: Physician Assistant

## 2020-10-04 ENCOUNTER — Ambulatory Visit (INDEPENDENT_AMBULATORY_CARE_PROVIDER_SITE_OTHER): Payer: Self-pay | Admitting: Physician Assistant

## 2020-10-04 ENCOUNTER — Other Ambulatory Visit: Payer: Self-pay

## 2020-10-04 VITALS — BP 101/65 | HR 69 | Temp 98.7°F | Ht 65.0 in | Wt 185.0 lb

## 2020-10-04 DIAGNOSIS — F909 Attention-deficit hyperactivity disorder, unspecified type: Secondary | ICD-10-CM

## 2020-10-04 DIAGNOSIS — F418 Other specified anxiety disorders: Secondary | ICD-10-CM

## 2020-10-04 DIAGNOSIS — J3489 Other specified disorders of nose and nasal sinuses: Secondary | ICD-10-CM

## 2020-10-04 MED ORDER — AMPHETAMINE-DEXTROAMPHETAMINE 5 MG PO TABS: 5.0000 mg | ORAL_TABLET | Freq: Two times a day (BID) | ORAL | 0 refills | Status: DC

## 2020-10-04 NOTE — Patient Instructions (Signed)

## 2020-10-04 NOTE — Progress Notes (Signed)
Established Patient Office Visit  Subjective:  Patient ID: April Harris, female    DOB: 03/11/1991  Age: 30 y.o. MRN: [id number]  CC:  Chief Complaint  Patient presents with  . Depression    HPI ADJOA ALTHOUSE presents for follow up on mood. States since restarting Sertraline mood has improved. Is seeing a therapist. Was not able to get established with Psychiatry, reports appointments were far out and was not given additional options. Wants to discuss possible ADHD. Endorses symptoms being easily distracted, cannot stay focueds, is fidgety and is not able to complete a task without starting another one. Feels like is moving in circles. Also has complaints of intermittent swelling of left nostril x 6 weeks, possibly a pimple. Denies fever, chills, coryza, or trauma/injury.    Past Medical History:  Diagnosis Date  . Asthma   . H/O LEEP (loop electrosurgical excision procedure) of cervix complicating pregnancy   . History of colposcopy   . IBS (irritable bowel syndrome)   . LGSIL on Pap smear of cervix     Past Surgical History:  Procedure Laterality Date  . COLPOSCOPY    . DILATION AND CURETTAGE OF UTERUS    . INDUCED ABORTION    . TONSILLECTOMY      Family History  Problem Relation Age of Onset  . Heart attack Maternal Grandfather   . Leukemia Maternal Grandfather   . Depression Mother   . Hyperlipidemia Mother   . Depression Maternal Grandmother   . Hypertension Paternal Grandmother   . Melanoma Paternal Grandfather     Social History   Socioeconomic History  . Marital status: Single    Spouse name: Not on file  . Number of children: 0  . Years of education: Bachelors  . Highest education level: Not on file  Occupational History  . Not on file  Tobacco Use  . Smoking status: Former Smoker    Packs/day: 0.25    Years: 14.00    Pack years: 3.50    Types: Cigarettes    Start date: 08/20/2004  . Smokeless tobacco: Never Used  Vaping Use  . Vaping  Use: Never used  Substance and Sexual Activity  . Alcohol use: Yes  . Drug use: No  . Sexual activity: Not Currently    Birth control/protection: I.U.D.    Comment: Mirena  Other Topics Concern  . Not on file  Social History Narrative  . Not on file   Social Determinants of Health   Financial Resource Strain: Not on file  Food Insecurity: Not on file  Transportation Needs: Not on file  Physical Activity: Not on file  Stress: Not on file  Social Connections: Not on file  Intimate Partner Violence: Not on file    Outpatient Medications Prior to Visit  Medication Sig Dispense Refill  . cetirizine (ZYRTEC) 5 MG tablet Take 5 mg by mouth daily.    Marland Kitchen MAGNESIUM CITRATE PO Take 150 mg by mouth daily.    . Multiple Vitamin (MULTIVITAMIN) capsule Take 1 capsule by mouth daily.    . sertraline (ZOLOFT) 50 MG tablet Take 1 tablet (50 mg total) by mouth at bedtime. 90 tablet 1  . Valerian Root 530 MG CAPS Take by mouth daily.    Marland Kitchen VITAMIN D PO Take 50 Int'l Units by mouth daily.    . Vitamin D, Ergocalciferol, (DRISDOL) 1.25 MG (50000 UNIT) CAPS capsule Take one tablet wkly 12 capsule 1   No facility-administered medications prior to visit.  Allergies  Allergen Reactions  . Penicillins     ROS Review of Systems A fourteen system review of systems was performed and found to be positive as per HPI.   Objective:    Physical Exam General:  Well Developed, well nourished, appropriate for stated age.  Neuro:  Alert and oriented,  extra-ocular muscles intact  HEENT:  Normocephalic, atraumatic, non-tender small mass of inner left nostril along lateral crus without drainage or erythema noted, neck supple  Skin:  no gross rash, warm, pink. Cardiac:  RRR, S1 S2 w/o murmur Respiratory:  ECTA B/L w/o wheezing, Not using accessory muscles, speaking in full sentences- unlabored. Vascular:  Ext warm, no cyanosis apprec.; cap RF less 2 sec. Psych:  No HI/SI, judgement and insight good,  Euthymic mood. Full Affect.   BP 101/65   Pulse 69   Temp 98.7 F (37.1 C)   Ht 5\' 5"  (1.651 m)   Wt 185 lb (83.9 kg)   SpO2 98%   BMI 30.79 kg/m  Wt Readings from Last 3 Encounters:  10/04/20 185 lb (83.9 kg)  07/18/20 186 lb (84.4 kg)  07/04/20 186 lb 6.4 oz (84.6 kg)     Health Maintenance Due  Topic Date Due  . Hepatitis C Screening  Never done  . INFLUENZA VACCINE  03/20/2020    There are no preventive care reminders to display for this patient.  Lab Results  Component Value Date   TSH 1.760 07/22/2019   Lab Results  Component Value Date   WBC 7.6 07/22/2019   HGB 14.1 07/22/2019   HCT 42.7 07/22/2019   MCV 93 07/22/2019   PLT 187 07/22/2019   Lab Results  Component Value Date   NA 139 07/22/2019   K 4.9 07/22/2019   CO2 21 07/22/2019   GLUCOSE 88 07/22/2019   BUN 15 07/22/2019   CREATININE 0.85 07/22/2019   BILITOT 0.3 07/22/2019   ALKPHOS 50 07/22/2019   AST 33 07/22/2019   ALT 73 (H) 07/22/2019   PROT 7.0 07/22/2019   ALBUMIN 4.7 07/22/2019   CALCIUM 9.7 07/22/2019   Lab Results  Component Value Date   CHOL 228 (H) 07/04/2020   Lab Results  Component Value Date   HDL 36 (L) 07/04/2020   Lab Results  Component Value Date   LDLCALC 174 (H) 07/04/2020   Lab Results  Component Value Date   TRIG 101 07/04/2020   Lab Results  Component Value Date   CHOLHDL 6.3 (H) 07/04/2020   Lab Results  Component Value Date   HGBA1C 5.3 07/22/2019      Assessment & Plan:   Problem List Items Addressed This Visit   None   Visit Diagnoses    Adult ADHD    -  Primary   Relevant Medications   amphetamine-dextroamphetamine (ADDERALL) 5 MG tablet   Depression with anxiety       Nasal mass         Depression with anxiety: -PHQ-9 score of 6, has improved from prior (score of 17).  Denies SI/HI. -Current medication regimen. -Continue behavioral health therapy sessions. -Will continue to monitor.  Adult ADHD: -Patient completed mobility  ADHD self-report scale which is highly consistent with ADHD.  Discussed management options including potential side effects and controlled substance policy, patient made verbal contract to follow-up every 3 months for medication management.  Will start low-dose of Adderall 5 mg to twice daily.  Advised to let me know if dose ineffective and will consider increasing  to 10 mg twice daily or if unable to tolerate medication.  Patient verbalized understanding and agreeable -Follow-up in 3 months. Adult ADHD Self Report Scale (most recent)    Adult ADHD Self-Report Scale (ASRS-v1.1) Symptom Checklist - 10/04/20 1648      Part A   1. How often do you have trouble wrapping up the final details of a project, once the challenging parts have been done? Very Often  2. How often do you have difficulty getting things done in order when you have to do a task that requires organization? Often    3. How often do you have problems remembering appointments or obligations? Rarely  4. When you have a task that requires a lot of thought, how often do you avoid or delay getting started? Sometimes    5. How often do you fidget or squirm with your hands or feet when you have to sit down for a long time? Very Often  6. How often do you feel overly active and compelled to do things, like you were driven by a motor? Often      Part B   7. How often do you make careless mistakes when you have to work on a boring or difficult project? Sometimes  8. How often do you have difficulty keeping your attention when you are doing boring or repetitive work? Often    9. How often do you have difficulty concentrating on what people say to you, even when they are speaking to you directly? Very Often  10. How often do you misplace or have difficulty finding things at home or at work? Often    11. How often are you distracted by activity or noise around you? Often  12. How often do you leave your seat in meetings or other situations in which you  are expected to remain seated? Rarely    13. How often do you feel restless or fidgety? Very Often  14. How often do you have difficulty unwinding and relaxing when you have time to yourself? Often    15. How often do you find yourself talking too much when you are in social situations? Very Often  16. When you are in a conversation, how often do you find yourself finishing the sentences of the people you are talking to, before they can finish them themselves? Often    17. How often do you have difficulty waiting your turn in situations when turn taking is required? Rarely  18. How often do you interrupt others when they are busy? Sometimes          Nasal mass: -Recommend ENT referral if symptoms fail to improve or worsen.    Meds ordered this encounter  Medications  . amphetamine-dextroamphetamine (ADDERALL) 5 MG tablet    Sig: Take 1 tablet (5 mg total) by mouth 2 (two) times daily with a meal.    Dispense:  60 tablet    Refill:  0    Order Specific Question:   Supervising Provider    Answer:   Nani Gasser D [[id number]]    Follow-up: Return in about 3 months (around 01/01/2021) for Mood, ADHD.   Note:  This note was prepared with assistance of Dragon voice recognition software. Occasional wrong-word or sound-a-like substitutions may have occurred due to the inherent limitations of voice recognition software.  Mayer Masker, PA-C

## 2020-10-06 ENCOUNTER — Ambulatory Visit (INDEPENDENT_AMBULATORY_CARE_PROVIDER_SITE_OTHER): Payer: Self-pay | Admitting: Psychology

## 2020-10-06 DIAGNOSIS — F411 Generalized anxiety disorder: Secondary | ICD-10-CM

## 2020-10-06 DIAGNOSIS — F331 Major depressive disorder, recurrent, moderate: Secondary | ICD-10-CM

## 2020-10-20 ENCOUNTER — Ambulatory Visit (INDEPENDENT_AMBULATORY_CARE_PROVIDER_SITE_OTHER): Payer: Self-pay | Admitting: Psychology

## 2020-10-20 DIAGNOSIS — F331 Major depressive disorder, recurrent, moderate: Secondary | ICD-10-CM

## 2020-10-20 DIAGNOSIS — F411 Generalized anxiety disorder: Secondary | ICD-10-CM

## 2020-10-25 ENCOUNTER — Encounter: Payer: Self-pay | Admitting: Physician Assistant

## 2020-10-25 DIAGNOSIS — F909 Attention-deficit hyperactivity disorder, unspecified type: Secondary | ICD-10-CM

## 2020-10-25 MED ORDER — AMPHETAMINE-DEXTROAMPHETAMINE 10 MG PO TABS: 10.0000 mg | ORAL_TABLET | Freq: Two times a day (BID) | ORAL | 0 refills | Status: DC

## 2020-11-03 ENCOUNTER — Ambulatory Visit (INDEPENDENT_AMBULATORY_CARE_PROVIDER_SITE_OTHER): Payer: Self-pay | Admitting: Psychology

## 2020-11-03 DIAGNOSIS — F331 Major depressive disorder, recurrent, moderate: Secondary | ICD-10-CM

## 2020-11-03 DIAGNOSIS — F411 Generalized anxiety disorder: Secondary | ICD-10-CM

## 2020-11-04 ENCOUNTER — Other Ambulatory Visit: Payer: Self-pay | Admitting: Physician Assistant

## 2020-11-04 DIAGNOSIS — F909 Attention-deficit hyperactivity disorder, unspecified type: Secondary | ICD-10-CM

## 2020-11-04 DIAGNOSIS — E559 Vitamin D deficiency, unspecified: Secondary | ICD-10-CM

## 2020-11-07 NOTE — Telephone Encounter (Signed)
Patient last seen 10/04/20 and advised to follow up in 3 months.   Last refill given 10/25/2020 #60 NRF

## 2020-11-17 ENCOUNTER — Ambulatory Visit (INDEPENDENT_AMBULATORY_CARE_PROVIDER_SITE_OTHER): Payer: Self-pay | Admitting: Psychology

## 2020-11-17 DIAGNOSIS — F411 Generalized anxiety disorder: Secondary | ICD-10-CM

## 2020-11-17 DIAGNOSIS — F331 Major depressive disorder, recurrent, moderate: Secondary | ICD-10-CM

## 2020-12-01 ENCOUNTER — Ambulatory Visit (INDEPENDENT_AMBULATORY_CARE_PROVIDER_SITE_OTHER): Payer: Self-pay | Admitting: Psychology

## 2020-12-01 DIAGNOSIS — F411 Generalized anxiety disorder: Secondary | ICD-10-CM

## 2020-12-01 DIAGNOSIS — F331 Major depressive disorder, recurrent, moderate: Secondary | ICD-10-CM

## 2020-12-19 ENCOUNTER — Other Ambulatory Visit: Payer: Self-pay | Admitting: Physician Assistant

## 2020-12-19 DIAGNOSIS — F909 Attention-deficit hyperactivity disorder, unspecified type: Secondary | ICD-10-CM

## 2020-12-19 MED ORDER — AMPHETAMINE-DEXTROAMPHETAMINE 10 MG PO TABS: 10.0000 mg | ORAL_TABLET | Freq: Two times a day (BID) | ORAL | 0 refills | Status: DC

## 2020-12-19 NOTE — Telephone Encounter (Signed)
Patient last seen 10/04/20 and advised to follow up in 3 months.    Last refill given 10/25/20 #60 no refill.    Pt has future apt scheduled 01/02/21. AS, CMA

## 2020-12-22 ENCOUNTER — Ambulatory Visit (INDEPENDENT_AMBULATORY_CARE_PROVIDER_SITE_OTHER): Payer: Self-pay | Admitting: Psychology

## 2020-12-22 DIAGNOSIS — F411 Generalized anxiety disorder: Secondary | ICD-10-CM

## 2020-12-22 DIAGNOSIS — F331 Major depressive disorder, recurrent, moderate: Secondary | ICD-10-CM

## 2021-01-02 ENCOUNTER — Encounter: Payer: Self-pay | Admitting: Physician Assistant

## 2021-01-02 ENCOUNTER — Ambulatory Visit (INDEPENDENT_AMBULATORY_CARE_PROVIDER_SITE_OTHER): Payer: Self-pay | Admitting: Physician Assistant

## 2021-01-02 ENCOUNTER — Other Ambulatory Visit: Payer: Self-pay

## 2021-01-02 VITALS — BP 127/82 | HR 69 | Temp 97.8°F | Ht 65.0 in | Wt 183.9 lb

## 2021-01-02 DIAGNOSIS — Z566 Other physical and mental strain related to work: Secondary | ICD-10-CM

## 2021-01-02 DIAGNOSIS — F909 Attention-deficit hyperactivity disorder, unspecified type: Secondary | ICD-10-CM

## 2021-01-02 DIAGNOSIS — F418 Other specified anxiety disorders: Secondary | ICD-10-CM

## 2021-01-02 MED ORDER — SERTRALINE HCL 50 MG PO TABS: 50.0000 mg | ORAL_TABLET | Freq: Every day | ORAL | 1 refills | Status: DC

## 2021-01-02 NOTE — Progress Notes (Signed)
Established Patient Office Visit  Subjective:  Patient ID: April Harris, female    DOB: [birth year]  Age: 30 y.o. MRN: [id number]  CC:  Chief Complaint  Patient presents with  . Follow-up  . Medication Refill  . ADHD    HPI April Harris presents for follow up on ADHD and mood management.  ADHD: Patient reports Adderall 10 mg twice daily has helped with focusing on a task at a time and completion.  Reports is feeling burnout with under staffing and medication has helped with increased responsibilities.  Denies sleep disturbance, chest pain, palpitations or headache.  Does report has to remember to eat, does have decreased appetite.  Mood: Reports medication compliance.  Reports significant stress with work due to increased responsibilities with staff shortages.  Is looking for a new job.  Denies SI/HI.    Past Medical History:  Diagnosis Date  . Asthma   . H/O LEEP (loop electrosurgical excision procedure) of cervix complicating pregnancy   . History of colposcopy   . IBS (irritable bowel syndrome)   . LGSIL on Pap smear of cervix     Past Surgical History:  Procedure Laterality Date  . COLPOSCOPY    . DILATION AND CURETTAGE OF UTERUS    . INDUCED ABORTION    . TONSILLECTOMY      Family History  Problem Relation Age of Onset  . Heart attack Maternal Grandfather   . Leukemia Maternal Grandfather   . Depression Mother   . Hyperlipidemia Mother   . Depression Maternal Grandmother   . Hypertension Paternal Grandmother   . Melanoma Paternal Grandfather     Social History   Socioeconomic History  . Marital status: Single    Spouse name: Not on file  . Number of children: 0  . Years of education: Bachelors  . Highest education level: Not on file  Occupational History  . Not on file  Tobacco Use  . Smoking status: Former Smoker    Packs/day: 0.25    Years: 14.00    Pack years: 3.50    Types: Cigarettes    Start date: 08/20/2004  . Smokeless tobacco:  Never Used  Vaping Use  . Vaping Use: Never used  Substance and Sexual Activity  . Alcohol use: Yes  . Drug use: No  . Sexual activity: Not Currently    Birth control/protection: I.U.D.    Comment: Mirena  Other Topics Concern  . Not on file  Social History Narrative  . Not on file   Social Determinants of Health   Financial Resource Strain: Not on file  Food Insecurity: Not on file  Transportation Needs: Not on file  Physical Activity: Not on file  Stress: Not on file  Social Connections: Not on file  Intimate Partner Violence: Not on file    Outpatient Medications Prior to Visit  Medication Sig Dispense Refill  . amphetamine-dextroamphetamine (ADDERALL) 10 MG tablet Take 1 tablet (10 mg total) by mouth 2 (two) times daily. 60 tablet 0  . cetirizine (ZYRTEC) 5 MG tablet Take 5 mg by mouth daily.    . Multiple Vitamin (MULTIVITAMIN) capsule Take 1 capsule by mouth daily.    Marland Kitchen VITAMIN D PO Take 50 Int'l Units by mouth daily.    . Vitamin D, Ergocalciferol, (DRISDOL) 1.25 MG (50000 UNIT) CAPS capsule TAKE 1 CAPSULE BY MOUTH WEEKLY 12 capsule 1  . sertraline (ZOLOFT) 50 MG tablet Take 1 tablet (50 mg total) by mouth at bedtime. 90 tablet  1  . MAGNESIUM CITRATE PO Take 150 mg by mouth daily.    Gerarda Fraction Root 530 MG CAPS Take by mouth daily.     No facility-administered medications prior to visit.    Allergies  Allergen Reactions  . Penicillins     ROS Review of Systems Review of Systems:  A fourteen system review of systems was performed and found to be positive as per HPI.  Objective:    Physical Exam General:  Well Developed, well nourished, in no acute distress  Neuro:  Alert and oriented,  extra-ocular muscles intact  HEENT:  Normocephalic, atraumatic, neck supple, no carotid bruits appreciated  Skin:  no gross rash, warm, pink. Cardiac:  RRR, S1 S2, no murmur  Respiratory:  ECTA B/L, Not using accessory muscles, speaking in full sentences-  unlabored. Vascular:  Ext warm, no cyanosis apprec.; cap RF less 2 sec. Psych:  No HI/SI, judgement and insight good, Euthymic mood. Full Affect.   BP 127/82   Pulse 69   Temp 97.8 F (36.6 C)   Ht 5\' 5"  (1.651 m)   Wt 183 lb 14.4 oz (83.4 kg)   SpO2 99%   BMI 30.60 kg/m  Wt Readings from Last 3 Encounters:  01/02/21 183 lb 14.4 oz (83.4 kg)  10/04/20 185 lb (83.9 kg)  07/18/20 186 lb (84.4 kg)     Health Maintenance Due  Topic Date Due  . Hepatitis C Screening  Never done    There are no preventive care reminders to display for this patient.  Lab Results  Component Value Date   TSH 1.760 07/22/2019   Lab Results  Component Value Date   WBC 7.6 07/22/2019   HGB 14.1 07/22/2019   HCT 42.7 07/22/2019   MCV 93 07/22/2019   PLT 187 07/22/2019   Lab Results  Component Value Date   NA 139 07/22/2019   K 4.9 07/22/2019   CO2 21 07/22/2019   GLUCOSE 88 07/22/2019   BUN 15 07/22/2019   CREATININE 0.85 07/22/2019   BILITOT 0.3 07/22/2019   ALKPHOS 50 07/22/2019   AST 33 07/22/2019   ALT 73 (H) 07/22/2019   PROT 7.0 07/22/2019   ALBUMIN 4.7 07/22/2019   CALCIUM 9.7 07/22/2019   Lab Results  Component Value Date   CHOL 228 (H) 07/04/2020   Lab Results  Component Value Date   HDL 36 (L) 07/04/2020   Lab Results  Component Value Date   LDLCALC 174 (H) 07/04/2020   Lab Results  Component Value Date   TRIG 101 07/04/2020   Lab Results  Component Value Date   CHOLHDL 6.3 (H) 07/04/2020   Lab Results  Component Value Date   HGBA1C 5.3 07/22/2019      Assessment & Plan:   Problem List Items Addressed This Visit   None   Visit Diagnoses    Adult ADHD    -  Primary   Depression with anxiety       Relevant Medications   sertraline (ZOLOFT) 50 MG tablet   Stress at work         Adult ADHD: -Stable. -PDMP reviewed, no aberrancies noted.  Adderall 10 mg twice daily last filled 12/19/20 for 30-day supply. -Discussed with patient to avoid  skipping meals, recommend 3 small meals with intermittent snacks. -BP and pulse within normal range. COVID-19 positive test (U07.1, COVID-19) with Acute Respiratory Distress Syndrome (ARDS) (J80, ARDS) (If respiratory failure or sepsis present, add as separate assessment) -Follow-up in 3  months for medication management.  Depression with anxiety, stress at work: -PHQ-9 score of 12.  Discussed with patient treatment adjustments and prefers to continue sertraline 50 mg. Reports symptoms are an acute reaction to work stress. -Advised if symptoms fail to improve or worsen to let me know.  Patient verbalized understanding. -Will continue to monitor.  Meds ordered this encounter  Medications  . sertraline (ZOLOFT) 50 MG tablet    Sig: Take 1 tablet (50 mg total) by mouth at bedtime.    Dispense:  90 tablet    Refill:  1    Order Specific Question:   Supervising Provider    Answer:   Nani Gasser D [[id number]]    Follow-up: Return in about 3 months (around 04/04/2021) for Mood, ADHD.   Note:  This note was prepared with assistance of Dragon voice recognition software. Occasional wrong-word or sound-a-like substitutions may have occurred due to the inherent limitations of voice recognition software.  Mayer Masker, PA-C

## 2021-01-02 NOTE — Patient Instructions (Signed)
Allergies, Adult An allergy is a condition in which the body's defense system (immune system) comes in contact with an allergen and reacts to it. An allergen is anything that causes an allergic reaction. Allergens cause the immune system to make proteins for fighting infections (antibodies). These antibodies cause cells to release chemicals called histamines that set off the symptoms of an allergic reaction. Allergies often affect the nasal passages (allergic rhinitis), eyes (allergic conjunctivitis), skin (atopic dermatitis), and stomach. Allergies can be mild, moderate, or severe. They cannot spread from person to person. Allergies can develop at any age and may be outgrown. What are the causes? This condition is caused by allergens. Common allergens include:  Outdoor allergens, such as pollen, car fumes, and mold.  Indoor allergens, such as dust, smoke, mold, and pet dander.  Other allergens, such as foods, medicines, scents, insect bites or stings, and other skin irritants. What increases the risk? You are more likely to develop this condition if you have:  Family members with allergies.  Family members who have any condition that may be caused by allergens, such as asthma. This may make you more likely to have other allergies. What are the signs or symptoms? Symptoms of this condition depend on the severity of the allergy. Mild to moderate symptoms  Runny nose, stuffy nose (nasal congestion), or sneezing.  Itchy mouth, ears, or throat.  A feeling of mucus dripping down the back of your throat (postnasal drip).  Sore throat.  Itchy, red, watery, or puffy eyes.  Skin rash, or itchy, red, swollen areas of skin (hives).  Stomach cramps or bloating. Severe symptoms Severe allergies to food, medicine, or insect bites may cause anaphylaxis, which can be life-threatening. Symptoms include:  A red (flushed) face.  Wheezing or coughing.  Swollen lips, tongue, or mouth.  Tight or  swollen throat.  Chest pain or tightness, or rapid heartbeat.  Trouble breathing or shortness of breath.  Pain in the abdomen, vomiting, or diarrhea.  Dizziness or fainting. How is this diagnosed? This condition is diagnosed based on your symptoms, your family and medical history, and a physical exam. You may also have tests, including:  Skin tests to see how your skin reacts to allergens that may be causing your symptoms. Tests include: ? Skin prick test. For this test, an allergen is introduced to your body through a small opening in the skin. ? Intradermal skin test. For this test, a small amount of allergen is injected under the first layer of your skin. ? Patch test. For this test, a small amount of allergen is placed on your skin. The area is covered and then checked after a few days.  Blood tests.  A challenge test. For this test, you will eat or breathe in a small amount of allergen to see if you have an allergic reaction. You may also be asked to:  Keep a food diary. This is a record of all the foods, drinks, and symptoms you have in a day.  Try an elimination diet. To do this: ? Remove certain foods from your diet. ? Add those foods back one by one to find out if any foods cause an allergic reaction. How is this treated? Treatment for allergies depends on your symptoms. Treatment may include:  Cold, wet cloths (cold compresses) to soothe itching and swelling.  Eye drops or nasal sprays.  Nasal irrigation to help clear your mucus or keep the nasal passages moist.  A humidifier to add moisture to the   air.  Skin creams to treat rashes or itching.  Oral antihistamines or other medicines to block the reaction or to treat inflammation.  Diet changes to remove foods that cause allergies.  Being exposed again and again to tiny amounts of allergens to help you build a defense against it (tolerance). This is called immunotherapy. Examples include: ? Allergy shot. You  receive an injection that contains an allergen. ? Sublingual immunotherapy. You take a small dose of allergen under your tongue.  Emergency injection for anaphylaxis. You give yourself a shot using a syringe (auto-injector) that contains the amount of medicine you need. Your health care provider will teach you how to give yourself an injection.      Follow these instructions at home: Medicines  Take or apply over-the-counter and prescription medicines only as told by your health care provider.  Always carry your auto-injector pen if you are at risk of anaphylaxis. Give yourself an injection as told by your health care provider.   Eating and drinking  Follow instructions from your health care provider about eating or drinking restrictions.  Drink enough fluid to keep your urine pale yellow. General instructions  Wear a medical alert bracelet or necklace to let others know that you have had anaphylaxis before.  Avoid known allergens whenever possible.  Keep all follow-up visits as told by your health care provider. This is important. Contact a health care provider if:  Your symptoms do not get better with treatment. Get help right away if:  You have symptoms of anaphylaxis. These include: ? Swollen mouth, tongue, or throat. ? Pain or tightness in your chest. ? Trouble breathing or shortness of breath. ? Dizziness or fainting. ? Severe abdominal pain, vomiting, or diarrhea. These symptoms may represent a serious problem that is an emergency. Do not wait to see if the symptoms will go away. Get medical help right away. Call your local emergency services (911 in the [place] Do not drive yourself to the hospital. Summary  Take or apply over-the-counter and prescription medicines only as told by your health care provider.  Avoid known allergens when possible.  Always carry your auto-injector pen if you are at risk of anaphylaxis. Give yourself an injection as told by your health  care provider.  Wear a medical alert bracelet or necklace to let others know that you have had anaphylaxis before.  Anaphylaxis is a life-threatening emergency. Get help right away. This information is not intended to replace advice given to you by your health care provider. Make sure you discuss any questions you have with your health care provider. Document Revised: 04/04/2020 Document Reviewed: 06/17/2019 Elsevier Patient Education  2021 [employer]  

## 2021-01-05 ENCOUNTER — Ambulatory Visit (INDEPENDENT_AMBULATORY_CARE_PROVIDER_SITE_OTHER): Payer: Self-pay | Admitting: Psychology

## 2021-01-05 DIAGNOSIS — F411 Generalized anxiety disorder: Secondary | ICD-10-CM

## 2021-01-05 DIAGNOSIS — F331 Major depressive disorder, recurrent, moderate: Secondary | ICD-10-CM

## 2021-01-19 ENCOUNTER — Ambulatory Visit (INDEPENDENT_AMBULATORY_CARE_PROVIDER_SITE_OTHER): Payer: Self-pay | Admitting: Psychology

## 2021-01-19 DIAGNOSIS — F411 Generalized anxiety disorder: Secondary | ICD-10-CM

## 2021-01-19 DIAGNOSIS — F331 Major depressive disorder, recurrent, moderate: Secondary | ICD-10-CM

## 2021-02-07 ENCOUNTER — Other Ambulatory Visit: Payer: Self-pay | Admitting: Physician Assistant

## 2021-02-07 DIAGNOSIS — F909 Attention-deficit hyperactivity disorder, unspecified type: Secondary | ICD-10-CM

## 2021-02-07 MED ORDER — AMPHETAMINE-DEXTROAMPHETAMINE 10 MG PO TABS: 10.0000 mg | ORAL_TABLET | Freq: Two times a day (BID) | ORAL | 0 refills | Status: DC

## 2021-02-09 ENCOUNTER — Ambulatory Visit: Payer: Self-pay | Admitting: Psychology

## 2021-02-16 ENCOUNTER — Ambulatory Visit: Payer: Self-pay | Admitting: Psychology

## 2021-03-02 ENCOUNTER — Ambulatory Visit: Payer: Self-pay | Admitting: Psychology

## 2021-03-16 ENCOUNTER — Ambulatory Visit: Payer: Self-pay | Admitting: Psychology

## 2021-03-21 ENCOUNTER — Other Ambulatory Visit: Payer: Self-pay | Admitting: Nurse Practitioner

## 2021-03-21 DIAGNOSIS — F909 Attention-deficit hyperactivity disorder, unspecified type: Secondary | ICD-10-CM

## 2021-03-21 MED ORDER — AMPHETAMINE-DEXTROAMPHETAMINE 10 MG PO TABS: 10.0000 mg | ORAL_TABLET | Freq: Two times a day (BID) | ORAL | 0 refills | Status: DC

## 2021-04-04 ENCOUNTER — Other Ambulatory Visit: Payer: Self-pay

## 2021-04-04 ENCOUNTER — Ambulatory Visit (INDEPENDENT_AMBULATORY_CARE_PROVIDER_SITE_OTHER): Payer: Self-pay | Admitting: Physician Assistant

## 2021-04-04 ENCOUNTER — Encounter: Payer: Self-pay | Admitting: Physician Assistant

## 2021-04-04 VITALS — BP 108/70 | HR 73 | Temp 97.9°F | Ht 65.0 in | Wt 186.2 lb

## 2021-04-04 DIAGNOSIS — F909 Attention-deficit hyperactivity disorder, unspecified type: Secondary | ICD-10-CM

## 2021-04-04 DIAGNOSIS — K219 Gastro-esophageal reflux disease without esophagitis: Secondary | ICD-10-CM

## 2021-04-04 DIAGNOSIS — F418 Other specified anxiety disorders: Secondary | ICD-10-CM

## 2021-04-04 MED ORDER — OMEPRAZOLE 20 MG PO CPDR: 20.0000 mg | DELAYED_RELEASE_CAPSULE | Freq: Every day | ORAL | 0 refills | Status: DC

## 2021-04-04 NOTE — Patient Instructions (Signed)
Living With Attention Deficit Hyperactivity Disorder If you have been diagnosed with attention deficit hyperactivity disorder (ADHD), you may be relieved that you now know why you have felt or behaved a certain way. Still, you may feel overwhelmed about the treatment ahead. You may also wonder how to get the support you need and how to deal with the condition day-to-day. With treatment and support, you can live with ADHD and manage your symptoms. How to manage lifestyle changes Managing stress Stress is your body's reaction to life changes and events, both good and bad. To cope with the stress of an ADHD diagnosis, it may help to: Learn more about ADHD. Exercise regularly. Even a short daily walk can lower stress levels. Participate in training or education programs (including social skills training classes) that teach you to deal with symptoms.  Medicines Your health care provider may suggest certain medicines if he or she feels that they will help to improve your condition. Stimulant medicines are usually prescribed to treat ADHD, and therapy may also be prescribed. It is important to: Avoid using alcohol and other substances that may prevent your medicines from working properly (may interact). Talk with your pharmacist or health care provider about all the medicines that you take, their possible side effects, and what medicines are safe to take together. Make it your goal to take part in all treatment decisions (shared decision-making). Ask about possible side effects of medicines that your health care provider recommends, and tell him or her how you feel about having those side effects. It is best if shared decision-making with your health care provider is part of your total treatment plan. Relationships To strengthen your relationships with family members while treating your condition, consider taking part in family therapy. You might also attend self-help groups alone or with a loved one. Be  honest about how your symptoms affect your relationships. Make an effort to communicate respectfully instead of fighting, and find ways to show others that you care. Psychotherapy may be useful in helping you cope with how ADHD affects your relationships. How to recognize changes in your condition The following signs may mean that your treatment is working well and your condition is improving: Consistently being on time for appointments. Being more organized at home and work. Other people noticing improvements in your behavior. Achieving goals that you set for yourself. Thinking more clearly. The following signs may mean that your treatment is not working very well: Feeling impatience or more confusion. Missing, forgetting, or being late for appointments. An increasing sense of disorganization and messiness. More difficulty in reaching goals that you set for yourself. Loved ones becoming angry or frustrated with you. Follow these instructions at home: Take over-the-counter and prescription medicines only as told by your health care provider. Check with your health care provider before taking any new medicines. Create structure and an organized atmosphere at home. For example: Make a list of tasks, then rank them from most important to least important. Work on one task at a time until your listed tasks are done. Make a daily schedule and follow it consistently every day. Use an appointment calendar, and check it 2 or 3 times a day to keep on track. Keep it with you when you leave the house. Create spaces where you keep certain things, and always put things back in their places after you use them. Keep all follow-up visits as told by your health care provider. This is important. Where to find support Talking to others    Keep emotion out of important discussions and speak in a calm, logical way. Listen closely and patiently to your loved ones. Try to understand their point of view, and try to  avoid getting defensive. Take responsibility for the consequences of your actions. Ask that others do not take your behaviors personally. Aim to solve problems as they come up, and express your feelings instead of bottling them up. Talk openly about what you need from your loved ones and how they can support you. Consider going to family therapy sessions or having your family meet with a specialist who deals with ADHD-related behavior problems. Finances Not all insurance plans cover mental health care, so it is important to check with your insurance carrier. If paying for co-pays or counseling services is a problem, search for a local or county mental health care center. Public mental health care services may be offered there at a low cost or no cost when you are not able to see a private health care provider. If you are taking medicine for ADHD, you may be able to get the generic form, which may be less expensive than brand-name medicine. Some makers of prescription medicines also offer help to patients who cannot afford the medicines that they need. Questions to ask your health care provider: What are the risks and benefits of taking medicines? Would I benefit from therapy? How often should I follow up with a health care provider? Contact a health care provider if: You have side effects from your medicines, such as: Repeated muscle twitches, coughing, or speech outbursts. Sleep problems. Loss of appetite. Depression. New or worsening behavior problems. Dizziness. Unusually fast heartbeat. Stomach pains. Headaches. Get help right away if: You have a severe reaction to a medicine. Your behavior suddenly gets worse. Summary With treatment and support, you can live with ADHD and manage your symptoms. The medicines that are most often prescribed for ADHD are stimulants. Consider taking part in family therapy or self-help groups with family members or friends. When you talk with friends  and family about your ADHD, be patient and communicate openly. Take over-the-counter and prescription medicines only as told by your health care provider. Check with your health care provider before taking any new medicines. This information is not intended to replace advice given to you by your health care provider. Make sure you discuss any questions you have with your health care provider. Document Revised: 01/20/2020 Document Reviewed: 01/20/2020 Elsevier Patient Education  2022 [employer]  

## 2021-04-04 NOTE — Progress Notes (Signed)
Established Patient Office Visit  Subjective:  Patient ID: April Harris, female    DOB: 06/26/1991  Age: 30 y.o. MRN: [id number]  CC:  Chief Complaint  Patient presents with   ADHD   Depression    HPI CONNER NEISS presents for follow up on ADHD and mood. Patient reports has needed to take OTC omeprazole 20 mg from acid reflux. When she has a severe flare-up will take 2 capsules for symptoms. States some of her triggers are pork, beef, and fried foods. Requesting rx for omeprazole. Reports has an endoscopy several years ago.  ADHD: Patient states does not always need to take the afternoon dose of Adderall, depends on her workload. Denies chest pain, palpitations, appetite or mood changes. Since starting medication therapy has been able to maintain her ability to focus and complete what she needs to do without getting distracted.  Mood: Taking medication as directed without issues. Mood has been stable. Denies SI/HI.  Past Medical History:  Diagnosis Date   Asthma    H/O LEEP (loop electrosurgical excision procedure) of cervix complicating pregnancy    History of colposcopy    IBS (irritable bowel syndrome)    LGSIL on Pap smear of cervix     Past Surgical History:  Procedure Laterality Date   COLPOSCOPY     DILATION AND CURETTAGE OF UTERUS     INDUCED ABORTION     TONSILLECTOMY      Family History  Problem Relation Age of Onset   Heart attack Maternal Grandfather    Leukemia Maternal Grandfather    Depression Mother    Hyperlipidemia Mother    Depression Maternal Grandmother    Hypertension Paternal Grandmother    Melanoma Paternal Grandfather     Social History   Socioeconomic History   Marital status: Single    Spouse name: Not on file   Number of children: 0   Years of education: Bachelors   Highest education level: Not on file  Occupational History   Not on file  Tobacco Use   Smoking status: Former    Packs/day: 0.25    Years: 14.00    Pack  years: 3.50    Types: Cigarettes    Start date: 08/20/2004   Smokeless tobacco: Never  Vaping Use   Vaping Use: Never used  Substance and Sexual Activity   Alcohol use: Yes   Drug use: No   Sexual activity: Not Currently    Birth control/protection: I.U.D.    Comment: Mirena  Other Topics Concern   Not on file  Social History Narrative   Not on file   Social Determinants of Health   Financial Resource Strain: Not on file  Food Insecurity: Not on file  Transportation Needs: Not on file  Physical Activity: Not on file  Stress: Not on file  Social Connections: Not on file  Intimate Partner Violence: Not on file    Outpatient Medications Prior to Visit  Medication Sig Dispense Refill   amphetamine-dextroamphetamine (ADDERALL) 10 MG tablet Take 1 tablet (10 mg total) by mouth 2 (two) times daily. 60 tablet 0   cetirizine (ZYRTEC) 5 MG tablet Take 5 mg by mouth daily.     Multiple Vitamin (MULTIVITAMIN) capsule Take 1 capsule by mouth daily.     sertraline (ZOLOFT) 50 MG tablet Take 1 tablet (50 mg total) by mouth at bedtime. 90 tablet 1   VITAMIN D PO Take 50 Int'l Units by mouth daily.     Vitamin  D, Ergocalciferol, (DRISDOL) 1.25 MG (50000 UNIT) CAPS capsule TAKE 1 CAPSULE BY MOUTH WEEKLY 12 capsule 1   No facility-administered medications prior to visit.    Allergies  Allergen Reactions   Penicillins     ROS Review of Systems Review of Systems:  A fourteen system review of systems was performed and found to be positive as per HPI.   Objective:    Physical Exam General:  Well Developed, well nourished, appropriate for stated age.  Neuro:  Alert and oriented,  extra-ocular muscles intact  HEENT:  Normocephalic, atraumatic, neck supple Skin:  no gross rash, warm, pink. Cardiac:  RRR Respiratory:  CTA B/L, Not using accessory muscles, speaking in full sentences- unlabored. Vascular:  Ext warm, no cyanosis apprec.; cap RF less 2 sec. Psych:  No HI/SI, judgement and  insight good, Euthymic mood. Full Affect.  BP 108/70   Pulse 73   Temp 97.9 F (36.6 C)   Ht 5\' 5"  (1.651 m)   Wt 186 lb 3.2 oz (84.5 kg)   SpO2 98%   BMI 30.99 kg/m  Wt Readings from Last 3 Encounters:  04/04/21 186 lb 3.2 oz (84.5 kg)  01/02/21 183 lb 14.4 oz (83.4 kg)  10/04/20 185 lb (83.9 kg)     Health Maintenance Due  Topic Date Due   Pneumococcal Vaccine 40-50 Years old (1 - PCV) Never done   Hepatitis C Screening  Never done   INFLUENZA VACCINE  03/20/2021    There are no preventive care reminders to display for this patient.  Lab Results  Component Value Date   TSH 1.760 07/22/2019   Lab Results  Component Value Date   WBC 7.6 07/22/2019   HGB 14.1 07/22/2019   HCT 42.7 07/22/2019   MCV 93 07/22/2019   PLT 187 07/22/2019   Lab Results  Component Value Date   NA 139 07/22/2019   K 4.9 07/22/2019   CO2 21 07/22/2019   GLUCOSE 88 07/22/2019   BUN 15 07/22/2019   CREATININE 0.85 07/22/2019   BILITOT 0.3 07/22/2019   ALKPHOS 50 07/22/2019   AST 33 07/22/2019   ALT 73 (H) 07/22/2019   PROT 7.0 07/22/2019   ALBUMIN 4.7 07/22/2019   CALCIUM 9.7 07/22/2019   Lab Results  Component Value Date   CHOL 228 (H) 07/04/2020   Lab Results  Component Value Date   HDL 36 (L) 07/04/2020   Lab Results  Component Value Date   LDLCALC 174 (H) 07/04/2020   Lab Results  Component Value Date   TRIG 101 07/04/2020   Lab Results  Component Value Date   CHOLHDL 6.3 (H) 07/04/2020   Lab Results  Component Value Date   HGBA1C 5.3 07/22/2019      Assessment & Plan:   Problem List Items Addressed This Visit   None Visit Diagnoses     Adult ADHD    -  Primary   Depression with anxiety       Gastroesophageal reflux disease, unspecified whether esophagitis present       Relevant Medications   omeprazole (PRILOSEC) 20 MG capsule      Adult ADHD: -Stable. -PDMP reviewed, no aberrancies noted. Patient not due for refill yet. -BP and pulse  stable. -Controlled substance contract updated. -Follow up in 3 months for medication management.  Depression with anxiety: -PHQ-9 score of 3. -Continue current medication regimen. -Will continue to monitor.  GERD: -Unable to locate prior upper endoscopy records. -Recommend to continue omeprazole 20 mg  and avoid provocative food triggers. If symptoms fail to improve or worsen recommend referral to gastroenterology, patient will benefit from a new endoscopy.    Meds ordered this encounter  Medications   omeprazole (PRILOSEC) 20 MG capsule    Sig: Take 1 capsule (20 mg total) by mouth daily.    Dispense:  90 capsule    Refill:  0    Order Specific Question:   Supervising Provider    Answer:   Nani Gasser D [[id number]]    Follow-up: Return in about 3 months (around 07/05/2021) for ADHD.    Mayer Masker, PA-C

## 2021-04-30 ENCOUNTER — Other Ambulatory Visit: Payer: Self-pay | Admitting: Physician Assistant

## 2021-04-30 DIAGNOSIS — F909 Attention-deficit hyperactivity disorder, unspecified type: Secondary | ICD-10-CM

## 2021-05-01 MED ORDER — AMPHETAMINE-DEXTROAMPHETAMINE 10 MG PO TABS: 10.0000 mg | ORAL_TABLET | Freq: Two times a day (BID) | ORAL | 0 refills | Status: DC

## 2021-05-25 ENCOUNTER — Ambulatory Visit (INDEPENDENT_AMBULATORY_CARE_PROVIDER_SITE_OTHER): Payer: Self-pay | Admitting: Advanced Practice Midwife

## 2021-05-25 ENCOUNTER — Other Ambulatory Visit (HOSPITAL_COMMUNITY)
Admission: RE | Admit: 2021-05-25 | Discharge: 2021-05-25 | Disposition: A | Discharge status: Home / Self Care | Payer: Self-pay | Source: Ambulatory Visit | Attending: Advanced Practice Midwife | Admitting: Advanced Practice Midwife

## 2021-05-25 ENCOUNTER — Other Ambulatory Visit: Payer: Self-pay

## 2021-05-25 ENCOUNTER — Telehealth: Payer: Self-pay | Admitting: Advanced Practice Midwife

## 2021-05-25 ENCOUNTER — Encounter: Payer: Self-pay | Admitting: Advanced Practice Midwife

## 2021-05-25 VITALS — BP 120/80 | Wt 197.0 lb

## 2021-05-25 DIAGNOSIS — Z124 Encounter for screening for malignant neoplasm of cervix: Secondary | ICD-10-CM | POA: Insufficient documentation

## 2021-05-25 DIAGNOSIS — Z3481 Encounter for supervision of other normal pregnancy, first trimester: Secondary | ICD-10-CM

## 2021-05-25 DIAGNOSIS — Z113 Encounter for screening for infections with a predominantly sexual mode of transmission: Secondary | ICD-10-CM | POA: Insufficient documentation

## 2021-05-25 DIAGNOSIS — Z369 Encounter for antenatal screening, unspecified: Secondary | ICD-10-CM

## 2021-05-25 DIAGNOSIS — O99211 Obesity complicating pregnancy, first trimester: Secondary | ICD-10-CM

## 2021-05-25 DIAGNOSIS — F1011 Alcohol abuse, in remission: Secondary | ICD-10-CM

## 2021-05-25 DIAGNOSIS — Z131 Encounter for screening for diabetes mellitus: Secondary | ICD-10-CM

## 2021-05-25 DIAGNOSIS — Z3A11 11 weeks gestation of pregnancy: Secondary | ICD-10-CM

## 2021-05-25 DIAGNOSIS — Z1159 Encounter for screening for other viral diseases: Secondary | ICD-10-CM

## 2021-05-25 NOTE — Telephone Encounter (Signed)
This pt is self pay and would like to speak with you.

## 2021-05-25 NOTE — Patient Instructions (Signed)

## 2021-05-27 ENCOUNTER — Encounter: Payer: Self-pay | Admitting: Advanced Practice Midwife

## 2021-05-27 DIAGNOSIS — Z3490 Encounter for supervision of normal pregnancy, unspecified, unspecified trimester: Secondary | ICD-10-CM | POA: Insufficient documentation

## 2021-05-27 NOTE — Progress Notes (Addendum)
New Obstetric Patient H&P  Date of Service: 05/25/2021  Chief Complaint: "Desires prenatal care"   History of Present Illness: Patient is a 31 y.o. G4P0030 Not Hispanic or Latino female, presents with amenorrhea and positive home pregnancy test. Patient's last menstrual period was 03/04/2021 (exact date). and based on her  LMP, her EDD is Estimated Date of Delivery: 12/09/21 and her EGA is [redacted]w[redacted]d. Cycles are 4-6 days, regular, and occur approximately every : 28 days. Her last pap smear was 1 years ago and was low-grade squamous intraepithelial neoplasia (LGSIL - encompassing HPV,mild dysplasia,CIN I).    She had a urine pregnancy test which was positive 6 week(s)  ago. Her last menstrual period was normal and lasted for  4 or 5 day(s). Since her LMP she claims she has experienced breast tenderness, fatigue, nausea, vomiting. She denies vaginal bleeding. Her past medical history is remarkable for IBS, alcoholism. Her prior pregnancies are notable for  TAB in 2007, SAB 2008 and 2010.  Since her LMP, she admits to the use of tobacco products  she quit smoking She claims she has gained no pounds since the start of her pregnancy.  There are cats in the home in the home  no She admits close contact with children on a regular basis  no  She has had chicken pox in the past yes She has had Tuberculosis exposures, symptoms, or previously tested positive for TB   no Current or past history of domestic violence. no  Genetic Screening/Teratology Counseling: (Includes patient, baby's father, or anyone in either family with:)   1. Patient's age >/= 79 at [facility]  no 2. Thalassemia ([place], [place], Mediterranean, or Asian background): MCV<80  no 3. Neural tube defect (meningomyelocele, spina bifida, anencephaly)  no 4. Congenital heart defect  no  5. Down syndrome  no 6. Tay-Sachs (Jewish, [place]  no 7. Canavan's Disease  no 8. Sickle cell disease or trait (African)  no  9. Hemophilia or other  blood disorders  no  10. Muscular dystrophy  no  11. Cystic fibrosis  no  12. Huntington's Chorea  no  13. Mental retardation/autism  no 14. Other inherited genetic or chromosomal disorder  no 15. Maternal metabolic disorder (DM, PKU, etc)  no 16. Patient or FOB with a child with a birth defect not listed above no  16a. Patient or FOB with a birth defect themselves no 17. Recurrent pregnancy loss, or stillbirth  no  18. Any medications since LMP other than prenatal vitamins (include vitamins, supplements, OTC meds, drugs, alcohol)  no 19. Any other genetic/environmental exposure to discuss  no  Infection History:   1. Lives with someone with TB or TB exposed  no  2. Patient or partner has history of genital herpes  no 3. Rash or viral illness since LMP  no 4. History of STI (GC, CT, HPV, syphilis, HIV)  Hx GC 5. History of recent travel :  no  Other pertinent information:  no     Review of Systems:10 point review of systems negative unless otherwise noted in HPI  Past Medical History:  Patient Active Problem List   Diagnosis Date Noted   Supervision of normal pregnancy 05/27/2021     Nursing Staff Provider  Office Location  [facility] Dating    Language  English Anatomy US    Flu Vaccine   Genetic Screen  NIPS:   TDaP vaccine    Hgb A1C or  GTT Early : Hgb A1C Third trimester :  Covid    LAB RESULTS   Rhogam   Blood Type     Feeding Plan Breast Antibody    Contraception  Rubella    Circumcision  RPR     Pediatrician   HBsAg     Support Person [name] HIV    Prenatal Classes  Varicella     GBS  (For PCN allergy, check sensitivities)   BTL Consent     VBAC Consent  Pap  05/25/2021    Hgb Electro    Pelvis Tested  CF      SMA            Vitamin D insufficiency 08/11/2019   Chronic alcoholism in remission (HCC) 05/30/2019    133 days sober  Notes drank [employer]; loves whisky.     Tobacco abuse counseling 05/30/2019   Family history of drug/alcohol addiction  05/30/2019   Family history of depression- in entire maternal lineage 05/30/2019   Family history of malignant melanoma- paternal grandfather 05/30/2019   Family history of hyperlipidemia-  mom 05/30/2019   Plantar warts 05/30/2019    Has history of recurring plantar warts;   treated at [facility];   sees them also yearly due to family history of melanoma    Overweight (BMI 25.0-29.9) 05/30/2019    Does intermittent fasting-fasts 20, eats 4 hours qd -went from 178 pounds down to 153 pounds since quitting etoh      Spondylisthesis 05/27/2019    Lumbar region.   Chronic back pain     h/o Hypercholesterolemia 11/12/2017    Was told that she had hyperlipidemia at some point in the past    Menorrhagia with regular cycle 11/07/2017   IBS (irritable bowel syndrome) 11/16/2009    Diarhea and constipation;   since stopping her alcohol intake, her diarrhea has primarily become constipation now.    -Treated with daily magnesium citrate  Sees GI in [place]    Asthma 04/21/2009    Past Surgical History:  Past Surgical History:  Procedure Laterality Date   COLPOSCOPY     DILATION AND CURETTAGE OF UTERUS     INDUCED ABORTION     TONSILLECTOMY      Gynecologic History: Patient's last menstrual period was 03/04/2021 (exact date).  Obstetric History: G4P0030  Family History:  Family History  Problem Relation Age of Onset   Heart attack Maternal Grandfather    Leukemia Maternal Grandfather    Depression Mother    Hyperlipidemia Mother    Depression Maternal Grandmother    Hypertension Paternal Grandmother    Melanoma Paternal Grandfather     Social History:  Social History   Socioeconomic History   Marital status: Single    Spouse name: Not on file   Number of children: 0   Years of education: Bachelors   Highest education level: Not on file  Occupational History   Not on file  Tobacco Use   Smoking status: Former    Packs/day: 0.25    Years: 14.00     Pack years: 3.50    Types: Cigarettes    Start date: 08/20/2004   Smokeless tobacco: Never  Vaping Use   Vaping Use: Never used  Substance and Sexual Activity   Alcohol use: Yes   Drug use: No   Sexual activity: Not Currently    Birth control/protection: I.U.D.    Comment: Mirena  Other Topics Concern   Not on file  Social History Narrative   Not on file  Social Determinants of Health   Financial Resource Strain: Not on file  Food Insecurity: Not on file  Transportation Needs: Not on file  Physical Activity: Not on file  Stress: Not on file  Social Connections: Not on file  Intimate Partner Violence: Not on file    Allergies:  Allergies  Allergen Reactions   Penicillins     Medications: Prior to Admission medications   Medication Sig Start Date End Date Taking? Authorizing Provider  amphetamine-dextroamphetamine (ADDERALL) 10 MG tablet Take 1 tablet (10 mg total) by mouth 2 (two) times daily. 05/01/21  Yes Abonza, Maritza, PA-C  cetirizine (ZYRTEC) 5 MG tablet Take 5 mg by mouth daily.   Yes [provider]  omeprazole (PRILOSEC) 20 MG capsule Take 1 capsule (20 mg total) by mouth daily. 04/04/21  Yes Abonza, Maritza, PA-C  sertraline (ZOLOFT) 50 MG tablet Take 1 tablet (50 mg total) by mouth at bedtime. 01/02/21  Yes Abonza, Maritza, PA-C  VITAMIN D PO Take 50 Int'l Units by mouth daily.   Yes [provider]  Vitamin D, Ergocalciferol, (DRISDOL) 1.25 MG (50000 UNIT) CAPS capsule TAKE 1 CAPSULE BY MOUTH WEEKLY 11/07/20  Yes Abonza, Maritza, PA-C  Multiple Vitamin (MULTIVITAMIN) capsule Take 1 capsule by mouth daily. Patient not taking: Reported on 05/25/2021    [provider]    Physical Exam Vitals: Blood pressure 120/80, weight 197 lb (89.4 kg), last menstrual period 03/04/2021.  General: NAD HEENT: normocephalic, anicteric Thyroid: no enlargement, no palpable nodules Pulmonary: No increased work of breathing, CTAB Cardiovascular: RRR,  distal pulses 2+ Abdomen: NABS, soft, non-tender, non-distended.  Umbilicus without lesions.  No hepatomegaly, splenomegaly or masses palpable. No evidence of hernia  Genitourinary:  External: Normal external female genitalia.  Normal urethral meatus, normal  Bartholin's and Skene's glands.    Vagina: Normal vaginal mucosa, no evidence of prolapse.    Cervix: Grossly normal in appearance, no bleeding, no CMT  Uterus:  Non-enlarged, mobile, normal contour.    Adnexa: ovaries non-enlarged, no adnexal masses  Rectal: deferred Extremities: no edema, erythema, or tenderness Neurologic: Grossly intact Psychiatric: mood appropriate, affect full  Fetal movement seen on BS ultrasound   The following were addressed during this visit:  Breastfeeding Education - Early initiation of breastfeeding    Comments: Keeps milk supply adequate, helps contract uterus and slow bleeding, and early milk is the perfect first food and is easy to digest.   - The importance of exclusive breastfeeding    Comments: Provides antibodies, Lower risk of breast and ovarian cancers, and type-2 diabetes,Helps your body recover, Reduced chance of SIDS.   - Risks of giving your baby anything other than breast milk if you are breastfeeding    Comments: Make the baby less content with breastfeeds, may make my baby more susceptible to illness, and may reduce my milk supply.   - The importance of early skin-to-skin contact    Comments:  Keeps baby warm and secure, helps keep baby's blood sugar up and breathing steady, easier to bond and breastfeed, and helps calm baby.  - Rooming-in on a 24-hour basis    Comments: Easier to learn baby's feeding cues, easier to bond and get to know each other, and encourages milk production.   - Feeding on demand or baby-led feeding    Comments: Helps prevent breastfeeding complications, helps bring in good milk supply, prevents under or overfeeding, and helps baby feel content and  satisfied   - Frequent feeding to help assure optimal  milk production    Comments: Making a full supply of milk requires frequent removal of milk from breasts, infant will eat 8-12 times in 24 hours, if separated from infant use breast massage, hand expression and/ or pumping to remove milk from breasts.   - Effective positioning and attachment    Comments: Helps my baby to get enough breast milk, helps to produce an adequate milk supply, and helps prevent nipple pain and damage   - Exclusive breastfeeding for the first 6 months    Comments: Builds a healthy milk supply and keeps it up, protects baby from sickness and disease, and breastmilk has everything your baby needs for the first 6 months.   Assessment: 30 y.o. G4P0030 at [redacted]w[redacted]d presenting to initiate prenatal care  Plan: 1) Avoid alcoholic beverages. 2) Patient encouraged not to smoke.  3) Discontinue the use of all non-medicinal drugs and chemicals.  4) Take prenatal vitamins daily.  5) Nutrition, food safety (fish, cheese advisories, and high nitrite foods) and exercise discussed. 6) Hospital and practice style discussed with cross coverage system.  7) Genetic Screening, such as with 1st Trimester Screening, cell free fetal DNA, AFP testing, and Ultrasound, as well as with amniocentesis and CVS as appropriate, is discussed with patient. At the conclusion of today's visit patient requested genetic testing 8) Patient is asked about travel to areas at risk for the Zika virus, and counseled to avoid travel and exposure to mosquitoes or sexual partners who may have themselves been exposed to the virus. Testing is discussed, and will be ordered as appropriate.  9) Urine culture, PAPtima today 10) Return to clinic in 1 week for dating scan and rob. NOB labs future ordered including CMP and Hgb A1C.   Tresea Mall, CNM [facility] 05/27/2021, 4:12 PM

## 2021-05-28 LAB — URINE CULTURE

## 2021-05-30 LAB — CYTOLOGY - PAP
Chlamydia: NEGATIVE
Comment: NEGATIVE
Comment: NEGATIVE
Comment: NEGATIVE
Comment: NORMAL
Diagnosis: NEGATIVE
High risk HPV: NEGATIVE
Neisseria Gonorrhea: NEGATIVE
Trichomonas: NEGATIVE

## 2021-06-01 ENCOUNTER — Encounter: Payer: Self-pay | Admitting: Obstetrics & Gynecology

## 2021-06-01 ENCOUNTER — Other Ambulatory Visit: Payer: Self-pay

## 2021-06-01 ENCOUNTER — Ambulatory Visit (INDEPENDENT_AMBULATORY_CARE_PROVIDER_SITE_OTHER): Payer: Self-pay | Admitting: Obstetrics & Gynecology

## 2021-06-01 VITALS — BP 100/70 | Wt 190.0 lb

## 2021-06-01 DIAGNOSIS — O99211 Obesity complicating pregnancy, first trimester: Secondary | ICD-10-CM

## 2021-06-01 DIAGNOSIS — N926 Irregular menstruation, unspecified: Secondary | ICD-10-CM

## 2021-06-01 DIAGNOSIS — Z3481 Encounter for supervision of other normal pregnancy, first trimester: Secondary | ICD-10-CM

## 2021-06-01 DIAGNOSIS — Z3A12 12 weeks gestation of pregnancy: Secondary | ICD-10-CM

## 2021-06-01 DIAGNOSIS — Z23 Encounter for immunization: Secondary | ICD-10-CM

## 2021-06-01 DIAGNOSIS — Z9889 Other specified postprocedural states: Secondary | ICD-10-CM | POA: Insufficient documentation

## 2021-06-01 LAB — POCT URINALYSIS DIPSTICK OB
Glucose, UA: NEGATIVE
POC,PROTEIN,UA: NEGATIVE

## 2021-06-01 NOTE — Progress Notes (Signed)
  Subjective  Some nausea, does not need meds No pain or bleeding  Objective  BP 100/70   Wt 190 lb (86.2 kg)   LMP 03/04/2021 (Exact Date)   BMI 31.62 kg/m  General: NAD Pumonary: no increased work of breathing Abdomen: gravid, non-tender Extremities: no edema Psychiatric: mood appropriate, affect full  Assessment  30 y.o. G4P0030 at [redacted]w[redacted]d by  12/09/2021, by Last Menstrual Period presenting for routine prenatal visit  Plan   Problem List Items Addressed This Visit      Other   Supervision of normal pregnancy - Primary   Relevant Orders   POC Urinalysis Dipstick OB (Completed)   History of loop electrosurgical excision procedure (LEEP)   Relevant Orders   US OB Transvaginal  Other Visit Diagnoses    Missed period       [redacted] weeks gestation of pregnancy       Obesity affecting pregnancy in first trimester       Need for immunization against influenza       Relevant Orders   Flu Vaccine QUAD 5mo+IM (Fluarix, Fluzone & Alfiuria Quad PF) (Completed)    Flu shot PNV [place], see report Plan [place] for cervical length 16 weeks (prior LEEP) Labs soon, has been waiting for insurance to kick in  pregnancy4 Problems (from 05/25/21 to present)      Nursing Staff Provider  Office Location  [facility] Dating    Language  English Anatomy US    Flu Vaccine   Genetic Screen  NIPS:   TDaP vaccine    Hgb A1C or  GTT Early : Hgb A1C Third trimester :   Covid    LAB RESULTS   Rhogam   Blood Type     Feeding Plan Breast Antibody    Contraception  Rubella    Circumcision  RPR     Pediatrician   HBsAg     Support Person April Harris HIV    Prenatal Classes  Varicella     GBS  (For PCN allergy, check sensitivities)   BTL Consent     VBAC Consent  Pap  05/25/2021    Hgb Electro    Pelvis Tested  CF      SMA                   April Major, MD, FACOG [facility] 06/01/2021  2:36 PM

## 2021-06-01 NOTE — Patient Instructions (Signed)

## 2021-06-01 NOTE — Progress Notes (Signed)
ULTRASOUND REPORT  Location: [facility] Date of Service: 06/01/2021   Indications:Unsure LMP Findings:  Mason Jim intrauterine pregnancy is visualized with a CRL consistent with [redacted]w[redacted]d gestation, giving an (U/S) EDD of 12/06/2021. The (U/S) EDD is consistent with the clinically established EDD of 12/09/21.  FHR: 150 BPM BPD measurement: 20 mm HC measurement: 75.7 mm Both c/w dates. Unable to measure [facility] or [place] based on fetal positioning today  Yolk sac is not visualized. Amnion: visualized and appears normal   Right Ovary is normal in appearance. Left Ovary is normal appearance. Corpus luteal cyst:  is not visualized Survey of the adnexa demonstrates no adnexal masses. There is no free peritoneal fluid in the cul de sac.  Impression: 1. [redacted]w[redacted]d Viable Singleton Intrauterine pregnancy by U/S. 2. (U/S) EDD is consistent with Clinically established EDD of 12/09/2021.  Recommendations: 1.Clinical correlation with the patient's History and Physical Exam. 2. Keep EDC 3. Based on h/o LEEP, plan on 16 week [place] for cervical length  Letitia Libra, MD

## 2021-06-07 ENCOUNTER — Ambulatory Visit (INDEPENDENT_AMBULATORY_CARE_PROVIDER_SITE_OTHER): Payer: PRIVATE HEALTH INSURANCE

## 2021-06-07 ENCOUNTER — Other Ambulatory Visit: Payer: Self-pay

## 2021-06-07 DIAGNOSIS — Z9889 Other specified postprocedural states: Secondary | ICD-10-CM

## 2021-06-23 ENCOUNTER — Other Ambulatory Visit: Payer: Self-pay

## 2021-06-23 ENCOUNTER — Ambulatory Visit (INDEPENDENT_AMBULATORY_CARE_PROVIDER_SITE_OTHER): Payer: Self-pay | Admitting: Advanced Practice Midwife

## 2021-06-23 ENCOUNTER — Encounter: Payer: Self-pay | Admitting: Advanced Practice Midwife

## 2021-06-23 VITALS — BP 120/80 | Wt 194.0 lb

## 2021-06-23 DIAGNOSIS — Z3482 Encounter for supervision of other normal pregnancy, second trimester: Secondary | ICD-10-CM

## 2021-06-23 DIAGNOSIS — O4692 Antepartum hemorrhage, unspecified, second trimester: Secondary | ICD-10-CM

## 2021-06-23 DIAGNOSIS — Z3A15 15 weeks gestation of pregnancy: Secondary | ICD-10-CM

## 2021-06-23 NOTE — Patient Instructions (Signed)
Vaginal Bleeding During Pregnancy, Second Trimester A small amount of bleeding from the vagina is common during pregnancy. This kind of bleeding is also called spotting. Sometimes the bleeding is normal and is not a sign of problems. In some other cases, it is a sign of something serious. In the second trimester, normal bleeding can happen: Because of changes in your blood vessels. When you have sex. When you have pelvic exams. In the second trimester, some abnormal things can cause bleeding. These include: Infection or swelling. Growths in the lowest part of the womb (cervix). These growths are also called polyps. Problems of the placenta. The placenta can block the opening of the cervix. The placenta can also break away from the womb. Miscarriage. Early labor. The cervix that opens early, before labor. An egg that was not fertilized properly. This causes a type of pregnancy called molar pregnancy. Tell your doctor right away if there is any bleeding from your vagina. Follow these instructions at home: Watch your bleeding  Watch your condition for any changes. Let your doctor know if you are worried about something. Try to know what causes your bleeding. Ask yourself these questions: Does the bleeding start on its own? Does the bleeding start after something is done, such as sex or a pelvic exam? Use a diary to write down the things you see about your bleeding. Write in your diary: If the bleeding flows freely without stopping, or if it starts and stops, and then starts again. If the bleeding is heavy or light. How many pads you use in a day and how much blood is in them. Tell your doctor if you pass tissue. He or she may want to see it. Activity Follow your doctor's instructions about limiting your activities. Ask what activities are safe for you. Do not exercise or do activities that take a lot of effort until your doctor says that this is safe. Do not have sex until your doctor says  that this is safe. Do not lift anything that is heavier than 10 lb (4.5 kg), or the limit that you are told. If needed, make plans for someone to help with your normal activities. Medicines Take over-the-counter and prescription medicines only as told by your doctor. Do not take aspirin. It can cause bleeding. General instructions Do not use tampons. Do not douche. Keep all follow-up visits. Contact a doctor if: You have bleeding in the vagina at any time during pregnancy. You have cramps. You have a fever that does not get better with medicine. Get help right away if: You have very bad cramps in your back or belly (abdomen). You have contractions. You have chills. Your bleeding gets worse. You pass large clots or a lot of tissue from your vagina. You feel light-headed. You feel weak. You faint. You are leaking fluid from your vagina. You have a gush of fluid from your vagina. Summary A small amount of bleeding during pregnancy is normal. But bleeding can be a sign of something serious. Tell your doctor right away about any bleeding from your vagina. Try to know what causes your bleeding. Does the bleeding occur on its own, or does it occur after something is done, such as sex or pelvic exams? Follow your doctor's instructions about what activities you can do. Keep all follow-up visits. This information is not intended to replace advice given to you by your health care provider. Make sure you discuss any questions you have with your health care provider. Document Revised:  04/28/2020 Document Reviewed: 04/28/2020 Elsevier Patient Education  2022 [employer].

## 2021-06-23 NOTE — Progress Notes (Signed)
Routine Prenatal Care Visit  Subjective  April Harris is a 30 y.o. G4P0030 at [redacted]w[redacted]d being seen today for ongoing prenatal care.  She is currently monitored for the following issues for this low-risk pregnancy and has Menorrhagia with regular cycle; h/o Hypercholesterolemia; IBS (irritable bowel syndrome); Asthma; Spondylisthesis; Chronic alcoholism in remission (HCC); Tobacco abuse counseling; Family history of drug/alcohol addiction; Family history of depression- in entire maternal lineage; Family history of malignant melanoma- paternal grandfather; Family history of hyperlipidemia-  mom; Plantar warts; Overweight (BMI 25.0-29.9); Vitamin D insufficiency; Supervision of normal pregnancy; and History of loop electrosurgical excision procedure (LEEP) on their problem list.  ----------------------------------------------------------------------------------- Patient reports vaginal bleeding that started this morning. The bleeding was bright red with wiping and a small amount on a pad. She quantifies it as a tablespoon amount and it was turning pink with most recent wiping. She reports last intercourse about 36 hours ago.  She has some cramping, however, it could be related to her IBS. Reassurance given and precautions reviewed.  Contractions: Not present. Vag. Bleeding: Small.   . Leaking Fluid denies.  ----------------------------------------------------------------------------------- The following portions of the patient's history were reviewed and updated as appropriate: allergies, current medications, past family history, past medical history, past social history, past surgical history and problem list. Problem list updated.  Objective  Blood pressure 120/80, weight 194 lb (88 kg), last menstrual period 03/04/2021. Pregravid weight 182 lb (82.6 kg) Total Weight Gain 12 lb (5.443 kg) Urinalysis: Urine Protein    Urine Glucose    Fetal Status: Fetal Heart Rate (bpm): 154         General:   Alert, oriented and cooperative. Patient is in no acute distress.  Skin: Skin is warm and dry. No rash noted.   Cardiovascular: Normal heart rate noted  Respiratory: Normal respiratory effort, no problems with respiration noted  Abdomen: Soft, gravid, appropriate for gestational age.       Pelvic:  sterile speculum exam no blood in the vaginal vault or coming from cervix        Extremities: Normal range of motion.  Edema: None  Mental Status: Normal mood and affect. Normal behavior. Normal judgment and thought content.   Assessment   30 y.o. G4P0030 at [redacted]w[redacted]d by  12/09/2021, by Last Menstrual Period presenting for work-in prenatal visit  Plan   pregnancy4 Problems (from 05/25/21 to present)    Problem Noted Resolved   Supervision of normal pregnancy 05/27/2021 by Tresea Mall, CNM No   Overview Signed 05/27/2021  4:10 PM by Tresea Mall, CNM     Nursing Staff Provider  Office Location  [facility] Dating    Language  English Anatomy US    Flu Vaccine   Genetic Screen  NIPS:   TDaP vaccine    Hgb A1C or  GTT Early : Hgb A1C Third trimester :   Covid    LAB RESULTS   Rhogam   Blood Type     Feeding Plan Breast Antibody    Contraception  Rubella    Circumcision  RPR     Pediatrician   HBsAg     Support Person [name] HIV    Prenatal Classes  Varicella     GBS  (For PCN allergy, check sensitivities)   BTL Consent     VBAC Consent  Pap  05/25/2021    Hgb Electro    Pelvis Tested  CF      SMA  Preterm labor symptoms and general obstetric precautions including but not limited to vaginal bleeding, contractions, leaking of fluid and fetal movement were reviewed in detail with the patient. Please refer to After Visit Summary for other counseling recommendations.   Return for scheduled visit.  Tresea Mall, CNM 06/23/2021 8:56 AM

## 2021-06-28 ENCOUNTER — Ambulatory Visit (INDEPENDENT_AMBULATORY_CARE_PROVIDER_SITE_OTHER): Payer: Self-pay | Admitting: Obstetrics

## 2021-06-28 ENCOUNTER — Other Ambulatory Visit: Payer: Self-pay

## 2021-06-28 VITALS — BP 90/70 | Wt 191.2 lb

## 2021-06-28 DIAGNOSIS — Z3A16 16 weeks gestation of pregnancy: Secondary | ICD-10-CM

## 2021-06-28 DIAGNOSIS — Z3482 Encounter for supervision of other normal pregnancy, second trimester: Secondary | ICD-10-CM

## 2021-06-28 LAB — POCT URINALYSIS DIPSTICK OB
Glucose, UA: NEGATIVE
POC,PROTEIN,UA: NEGATIVE

## 2021-06-28 NOTE — Progress Notes (Signed)
Routine Prenatal Care Visit  Subjective  April Harris is a 30 y.o. G4P0030 at [redacted]w[redacted]d being seen today for ongoing prenatal care.  She is currently monitored for the following issues for this high-risk pregnancy and has Menorrhagia with regular cycle; h/o Hypercholesterolemia; IBS (irritable bowel syndrome); Asthma; Spondylisthesis; Chronic alcoholism in remission ([place]); Tobacco abuse counseling; Family history of drug/alcohol addiction; Family history of depression- in entire maternal lineage; Family history of malignant melanoma- paternal grandfather; Family history of hyperlipidemia-  mom; Plantar warts; Overweight (BMI 25.0-29.9); Vitamin D insufficiency; Supervision of normal pregnancy; and History of loop electrosurgical excision procedure (LEEP) on their problem list.  --------------------------------------------------She does ot havei nisurance, so is self pay, and is working on getting coverage  .  .   . Leaking Fluid denies.  ----------------------------------------------------------------------------------- The following portions of the patient's history were reviewed and updated as appropriate: allergies, current medications, past family history, past medical history, past social history, past surgical history and problem list. Problem list updated.  Objective  Blood pressure 90/70, weight 191 lb 3.2 oz (86.7 kg), last menstrual period 03/04/2021. Pregravid weight 182 lb (82.6 kg) Total Weight Gain 9 lb 3.2 oz (4.173 kg) Urinalysis: Urine Protein Negative  Urine Glucose Negative  Fetal Status:           General:  Alert, oriented and cooperative. Patient is in no acute distress.  Skin: Skin is warm and dry. No rash noted.   Cardiovascular: Normal heart rate noted  Respiratory: Normal respiratory effort, no problems with respiration noted  Abdomen: Soft, gravid, appropriate for gestational age.       Pelvic:  Cervical exam deferred        Extremities: Normal range of motion.   Edema: None  Mental Status: Normal mood and affect. Normal behavior. Normal judgment and thought content.   Assessment   30 y.o. G4P0030 at [redacted]w[redacted]d by  12/09/2021, by Last Menstrual Period presenting for routine prenatal visit  Plan   pregnancy4 Problems (from 05/25/21 to present)    Problem Noted Resolved   Supervision of normal pregnancy 05/27/2021 by Rod Can, CNM No   Overview Signed 05/27/2021  4:10 PM by Rod Can, CNM     Nursing Staff Provider  Office Location  [facility] Dating    Language  English Anatomy US    Flu Vaccine   Genetic Screen  NIPS:   TDaP vaccine    Hgb A1C or  GTT Early : Hgb A1C Third trimester :   Covid    LAB RESULTS   Rhogam   Blood Type     Feeding Plan Breast Antibody    Contraception  Rubella    Circumcision  RPR     Pediatrician   HBsAg     Support Person [name] HIV    Prenatal Classes  Varicella     GBS  (For PCN allergy, check sensitivities)   BTL Consent     VBAC Consent  Pap  05/25/2021    Hgb Electro    Pelvis Tested  CF      SMA                   Preterm labor symptoms and general obstetric precautions including but not limited to vaginal bleeding, contractions, leaking of fluid and fetal movement were reviewed in detail with the patient. Please refer to After Visit Summary for other counseling recommendations.   Return in about 4 weeks (around 07/26/2021) for return OB.  Imagene Riches, CNM  06/28/2021 5:18 PM

## 2021-06-28 NOTE — Progress Notes (Signed)
ROB- no concerns (pt refused to get her weight checked, says last time her weight was 8 lbs heavier than her at home scale. Says she checks her weight every day in the morning)

## 2021-06-30 ENCOUNTER — Other Ambulatory Visit: Payer: Self-pay

## 2021-06-30 DIAGNOSIS — O99211 Obesity complicating pregnancy, first trimester: Secondary | ICD-10-CM

## 2021-06-30 DIAGNOSIS — F1011 Alcohol abuse, in remission: Secondary | ICD-10-CM

## 2021-06-30 DIAGNOSIS — Z1159 Encounter for screening for other viral diseases: Secondary | ICD-10-CM

## 2021-06-30 DIAGNOSIS — Z131 Encounter for screening for diabetes mellitus: Secondary | ICD-10-CM

## 2021-06-30 DIAGNOSIS — Z3481 Encounter for supervision of other normal pregnancy, first trimester: Secondary | ICD-10-CM

## 2021-06-30 DIAGNOSIS — Z113 Encounter for screening for infections with a predominantly sexual mode of transmission: Secondary | ICD-10-CM

## 2021-06-30 DIAGNOSIS — Z369 Encounter for antenatal screening, unspecified: Secondary | ICD-10-CM

## 2021-07-01 LAB — COMPREHENSIVE METABOLIC PANEL
ALT: 17 IU/L (ref 0–32)
AST: 14 IU/L (ref 0–40)
Albumin/Globulin Ratio: 2 (ref 1.2–2.2)
Albumin: 4.1 g/dL (ref 3.8–4.8)
Alkaline Phosphatase: 52 IU/L (ref 44–121)
BUN/Creatinine Ratio: 18 (ref 9–23)
BUN: 10 mg/dL (ref 6–20)
Bilirubin Total: <0.2 mg/dL (ref 0.0–1.2)
CO2: 19 mmol/L — ABNORMAL LOW (ref 20–29)
Calcium: 9 mg/dL (ref 8.7–10.2)
Chloride: 103 mmol/L (ref 96–106)
Creatinine, Ser: 0.55 mg/dL — ABNORMAL LOW (ref 0.57–1.00)
Globulin, Total: 2.1 g/dL (ref 1.5–4.5)
Glucose: 87 mg/dL (ref 70–99)
Potassium: 4.3 mmol/L (ref 3.5–5.2)
Sodium: 136 mmol/L (ref 134–144)
Total Protein: 6.2 g/dL (ref 6.0–8.5)
eGFR: 126 mL/min/{1.73_m2} (ref >59)

## 2021-07-01 LAB — HGB A1C W/O EAG: Hgb A1c MFr Bld: 5.4 % (ref 4.8–5.6)

## 2021-07-01 LAB — RPR+RH+ABO+RUB AB+AB SCR+CB...
Antibody Screen: NEGATIVE
HIV Screen 4th Generation wRfx: NONREACTIVE
Hematocrit: 36.3 % (ref 34.0–46.6)
Hemoglobin: 12.1 g/dL (ref 11.1–15.9)
Hepatitis B Surface Ag: NEGATIVE
MCH: 31 pg (ref 26.6–33.0)
MCHC: 33.3 g/dL (ref 31.5–35.7)
MCV: 93 fL (ref 79–97)
Platelets: 201 10*3/uL (ref 150–450)
RBC: 3.9 x10E6/uL (ref 3.77–5.28)
RDW: 11.9 % (ref 11.7–15.4)
RPR Ser Ql: NONREACTIVE
Rh Factor: POSITIVE
Rubella Antibodies, IGG: 12.3 index (ref >0.99)
Varicella zoster IgG: 2367 index (ref >165)
WBC: 9.3 10*3/uL (ref 3.4–10.8)

## 2021-07-01 LAB — HEPATITIS C ANTIBODY: Hep C Virus Ab: <0.1 s/co ratio (ref 0.0–0.9)

## 2021-07-06 ENCOUNTER — Encounter: Payer: Self-pay | Admitting: Physician Assistant

## 2021-07-06 ENCOUNTER — Other Ambulatory Visit: Payer: Self-pay

## 2021-07-06 ENCOUNTER — Ambulatory Visit (INDEPENDENT_AMBULATORY_CARE_PROVIDER_SITE_OTHER): Payer: Self-pay | Admitting: Physician Assistant

## 2021-07-06 VITALS — BP 107/68 | HR 69 | Temp 97.8°F | Ht 65.0 in | Wt 199.4 lb

## 2021-07-06 DIAGNOSIS — F909 Attention-deficit hyperactivity disorder, unspecified type: Secondary | ICD-10-CM

## 2021-07-06 DIAGNOSIS — F418 Other specified anxiety disorders: Secondary | ICD-10-CM

## 2021-07-06 NOTE — Progress Notes (Signed)
Established Patient Office Visit  Subjective:  Patient ID: April Harris, female    DOB: 1990-10-24  Age: 30 y.o. MRN: [id number]  CC:  Chief Complaint  Patient presents with   Follow-up    ADHD    HPI April Harris presents for follow-up on ADHD management. Patient is [redacted] weeks pregnant. Weaned off medication (Adderall) and has been without medication for about a month. States does have a scattered brain but still able to function. Has been trying to limit distractions. Reports still able to get things done at work but at a slower pace and sometimes has to backtrack. Her expected due date 12/06/2020, followed by [facility]. Reports her mood has been stable. Continues with sertraline. No SI/HI.  Past Medical History:  Diagnosis Date   Asthma    H/O LEEP (loop electrosurgical excision procedure) of cervix complicating pregnancy    History of colposcopy    IBS (irritable bowel syndrome)    LGSIL on Pap smear of cervix     Past Surgical History:  Procedure Laterality Date   COLPOSCOPY     DILATION AND CURETTAGE OF UTERUS     INDUCED ABORTION     TONSILLECTOMY      Family History  Problem Relation Age of Onset   Heart attack Maternal Grandfather    Leukemia Maternal Grandfather    Depression Mother    Hyperlipidemia Mother    Depression Maternal Grandmother    Hypertension Paternal Grandmother    Melanoma Paternal Grandfather     Social History   Socioeconomic History   Marital status: Single    Spouse name: Not on file   Number of children: 0   Years of education: Bachelors   Highest education level: Not on file  Occupational History   Not on file  Tobacco Use   Smoking status: Former    Packs/day: 0.25    Years: 14.00    Pack years: 3.50    Types: Cigarettes    Start date: 08/20/2004   Smokeless tobacco: Never  Vaping Use   Vaping Use: Never used  Substance and Sexual Activity   Alcohol use: Yes   Drug use: No   Sexual activity: Not Currently     Birth control/protection: I.U.D.    Comment: Mirena  Other Topics Concern   Not on file  Social History Narrative   Not on file   Social Determinants of Health   Financial Resource Strain: Not on file  Food Insecurity: Not on file  Transportation Needs: Not on file  Physical Activity: Not on file  Stress: Not on file  Social Connections: Not on file  Intimate Partner Violence: Not on file    Outpatient Medications Prior to Visit  Medication Sig Dispense Refill   Calcium Carbonate (CALCIUM 500 PO) Take by mouth.     cetirizine (ZYRTEC) 5 MG tablet Take 5 mg by mouth daily.     Multiple Vitamin (MULTIVITAMIN) capsule Take 1 capsule by mouth daily.     omeprazole (PRILOSEC) 20 MG capsule Take 1 capsule (20 mg total) by mouth daily. 90 capsule 0   Prenatal Vit-Fe Fumarate-FA (PRENATAL MULTIVITAMIN) TABS tablet Take 1 tablet by mouth daily at 12 noon.     sertraline (ZOLOFT) 50 MG tablet Take 1 tablet (50 mg total) by mouth at bedtime. 90 tablet 1   VITAMIN D PO Take 50 Int'l Units by mouth daily.     Vitamin D, Ergocalciferol, (DRISDOL) 1.25 MG (50000 UNIT) CAPS capsule  TAKE 1 CAPSULE BY MOUTH WEEKLY 12 capsule 1   amphetamine-dextroamphetamine (ADDERALL) 10 MG tablet Take 1 tablet (10 mg total) by mouth 2 (two) times daily. 60 tablet 0   No facility-administered medications prior to visit.    Allergies  Allergen Reactions   Penicillins     ROS Review of Systems Review of Systems:  A fourteen system review of systems was performed and found to be positive as per HPI. Objective:    Physical Exam General:  Well Developed, well nourished, appropriate for stated age.  Neuro:  Alert and oriented,  extra-ocular muscles intact  HEENT:  Normocephalic, atraumatic, neck supple, no carotid bruits appreciated  Skin:  no gross rash, warm, pink. Cardiac:  RRR Respiratory: CTA B/L w/o wheezing, Not using accessory muscles, speaking in full sentences- unlabored. Vascular:  Ext  warm, no cyanosis apprec.; cap RF less 2 sec. Psych:  No HI/SI, judgement and insight good, Euthymic mood. Full Affect.  BP 107/68   Pulse 69   Temp 97.8 F (36.6 C)   Ht _0  (1.651 m)   Wt 199 lb 6.4 oz (90.4 kg)   LMP 03/04/2021 (Exact Date)   SpO2 97%   BMI 33.18 kg/m  Wt Readings from Last 3 Encounters:  07/06/21 199 lb 6.4 oz (90.4 kg)  06/28/21 191 lb 3.2 oz (86.7 kg)  06/23/21 194 lb (88 kg)     Health Maintenance Due  Topic Date Due   Pneumococcal Vaccine 76-33 Years old (1 - PCV) Never done   COVID-19 Vaccine (4 - Booster for Pfizer series) 10/12/2020    There are no preventive care reminders to display for this patient.  Lab Results  Component Value Date   TSH 1.760 07/22/2019   Lab Results  Component Value Date   WBC 9.3 06/30/2021   HGB 12.1 06/30/2021   HCT 36.3 06/30/2021   MCV 93 06/30/2021   PLT 201 06/30/2021   Lab Results  Component Value Date   NA 136 06/30/2021   K 4.3 06/30/2021   CO2 19 (L) 06/30/2021   GLUCOSE 87 06/30/2021   BUN 10 06/30/2021   CREATININE 0.55 (L) 06/30/2021   BILITOT <0.2 06/30/2021   ALKPHOS 52 06/30/2021   AST 14 06/30/2021   ALT 17 06/30/2021   PROT 6.2 06/30/2021   ALBUMIN 4.1 06/30/2021   CALCIUM 9.0 06/30/2021   EGFR 126 06/30/2021   Lab Results  Component Value Date   CHOL 228 (H) 07/04/2020   Lab Results  Component Value Date   HDL 36 (L) 07/04/2020   Lab Results  Component Value Date   LDLCALC 174 (H) 07/04/2020   Lab Results  Component Value Date   TRIG 101 07/04/2020   Lab Results  Component Value Date   CHOLHDL 6.3 (H) 07/04/2020   Lab Results  Component Value Date   HGBA1C 5.4 06/30/2021    Depression screen [facility] 2/9 07/06/2021 04/04/2021 01/02/2021 10/04/2020 07/04/2020  Decreased Interest 0 0 1 0 2  Down, Depressed, Hopeless 0 0 1 - 3  PHQ - 2 Score 0 0 2 0 5  Altered sleeping 3 0 _1 Tired, decreased energy 1 0 _2 Change in appetite 0 0 _3 Feeling bad or failure  about yourself  0 0 1 0 1  Trouble concentrating 1 0 _4 Moving slowly or fidgety/restless 1 0 _5 Suicidal thoughts 0 0 0 0 0  PHQ-9  Score 6 0 _0 Difficult doing work/chores Somewhat difficult - - - Somewhat difficult   GAD 7 : Generalized Anxiety Score 07/06/2021 08/11/2019  Nervous, Anxious, on Edge 1 0  Control/stop worrying 0 0  Worry too much - different things 0 0  Trouble relaxing 1 0  Restless 0 1  Easily annoyed or irritable 0 0  Afraid - awful might happen 0 0  Total GAD 7 Score 2 1  Anxiety Difficulty Not difficult at all Not difficult at all        Assessment & Plan:   Problem List Items Addressed This Visit   None Visit Diagnoses     Adult ADHD    -  Primary   Depression with anxiety          Adult ADHD: -Discussed with patient risks vs benefits of medication therapy and given patient has been able to fulfill job duties recommend to continue with drug holiday. Discussed non-pharmacologic therapy such as having a to-do list and completing one task at a time.  -Will continue to monitor.  Depression with anxiety: -Stable. -Continue current medication regimen. -Will continue to monitor.   No orders of the defined types were placed in this encounter.   Follow-up: Return in about 6 months (around 01/03/2022) for Mood, ADHD.    Lorrene Reid, PA-C

## 2021-07-06 NOTE — Patient Instructions (Signed)
Living With Attention Deficit Hyperactivity Disorder If you have been diagnosed with attention deficit hyperactivity disorder (ADHD), you may be relieved that you now know why you have felt or behaved a certain way. Still, you may feel overwhelmed about the treatment ahead. You may also wonder how to get the support you need and how to deal with the condition day-to-day. With treatment and support, you can live with ADHD and manage your symptoms. How to manage lifestyle changes Managing stress Stress is your body's reaction to life changes and events, both good and bad. To cope with the stress of an ADHD diagnosis, it may help to: Learn more about ADHD. Exercise regularly. Even a short daily walk can lower stress levels. Participate in training or education programs (including social skills training classes) that teach you to deal with symptoms.  Medicines Your health care provider may suggest certain medicines if he or she feels that they will help to improve your condition. Stimulant medicines are usually prescribed to treat ADHD, and therapy may also be prescribed. It is important to: Avoid using alcohol and other substances that may prevent your medicines from working properly (may interact). Talk with your pharmacist or health care provider about all the medicines that you take, their possible side effects, and what medicines are safe to take together. Make it your goal to take part in all treatment decisions (shared decision-making). Ask about possible side effects of medicines that your health care provider recommends, and tell him or her how you feel about having those side effects. It is best if shared decision-making with your health care provider is part of your total treatment plan. Relationships To strengthen your relationships with family members while treating your condition, consider taking part in family therapy. You might also attend self-help groups alone or with a loved one. Be  honest about how your symptoms affect your relationships. Make an effort to communicate respectfully instead of fighting, and find ways to show others that you care. Psychotherapy may be useful in helping you cope with how ADHD affects your relationships. How to recognize changes in your condition The following signs may mean that your treatment is working well and your condition is improving: Consistently being on time for appointments. Being more organized at home and work. Other people noticing improvements in your behavior. Achieving goals that you set for yourself. Thinking more clearly. The following signs may mean that your treatment is not working very well: Feeling impatience or more confusion. Missing, forgetting, or being late for appointments. An increasing sense of disorganization and messiness. More difficulty in reaching goals that you set for yourself. Loved ones becoming angry or frustrated with you. Follow these instructions at home: Take over-the-counter and prescription medicines only as told by your health care provider. Check with your health care provider before taking any new medicines. Create structure and an organized atmosphere at home. For example: Make a list of tasks, then rank them from most important to least important. Work on one task at a time until your listed tasks are done. Make a daily schedule and follow it consistently every day. Use an appointment calendar, and check it 2 or 3 times a day to keep on track. Keep it with you when you leave the house. Create spaces where you keep certain things, and always put things back in their places after you use them. Keep all follow-up visits as told by your health care provider. This is important. Where to find support Talking to others    Keep emotion out of important discussions and speak in a calm, logical way. Listen closely and patiently to your loved ones. Try to understand their point of view, and try to  avoid getting defensive. Take responsibility for the consequences of your actions. Ask that others do not take your behaviors personally. Aim to solve problems as they come up, and express your feelings instead of bottling them up. Talk openly about what you need from your loved ones and how they can support you. Consider going to family therapy sessions or having your family meet with a specialist who deals with ADHD-related behavior problems. Finances Not all insurance plans cover mental health care, so it is important to check with your insurance carrier. If paying for co-pays or counseling services is a problem, search for a local or county mental health care center. Public mental health care services may be offered there at a low cost or no cost when you are not able to see a private health care provider. If you are taking medicine for ADHD, you may be able to get the generic form, which may be less expensive than brand-name medicine. Some makers of prescription medicines also offer help to patients who cannot afford the medicines that they need. Questions to ask your health care provider: What are the risks and benefits of taking medicines? Would I benefit from therapy? How often should I follow up with a health care provider? Contact a health care provider if: You have side effects from your medicines, such as: Repeated muscle twitches, coughing, or speech outbursts. Sleep problems. Loss of appetite. Depression. New or worsening behavior problems. Dizziness. Unusually fast heartbeat. Stomach pains. Headaches. Get help right away if: You have a severe reaction to a medicine. Your behavior suddenly gets worse. Summary With treatment and support, you can live with ADHD and manage your symptoms. The medicines that are most often prescribed for ADHD are stimulants. Consider taking part in family therapy or self-help groups with family members or friends. When you talk with friends  and family about your ADHD, be patient and communicate openly. Take over-the-counter and prescription medicines only as told by your health care provider. Check with your health care provider before taking any new medicines. This information is not intended to replace advice given to you by your health care provider. Make sure you discuss any questions you have with your health care provider. Document Revised: 01/20/2020 Document Reviewed: 01/20/2020 Elsevier Patient Education  2022 [employer]  

## 2021-07-20 ENCOUNTER — Other Ambulatory Visit: Payer: Self-pay | Admitting: Physician Assistant

## 2021-07-20 ENCOUNTER — Ambulatory Visit
Admission: RE | Admit: 2021-07-20 | Discharge: 2021-07-20 | Disposition: A | Discharge status: Home / Self Care | Payer: Self-pay | Source: Ambulatory Visit | Attending: Obstetrics | Admitting: Obstetrics

## 2021-07-20 ENCOUNTER — Other Ambulatory Visit: Payer: Self-pay

## 2021-07-20 DIAGNOSIS — Z3482 Encounter for supervision of other normal pregnancy, second trimester: Secondary | ICD-10-CM | POA: Insufficient documentation

## 2021-07-20 DIAGNOSIS — K219 Gastro-esophageal reflux disease without esophagitis: Secondary | ICD-10-CM

## 2021-07-20 MED ORDER — OMEPRAZOLE 20 MG PO CPDR: 20.0000 mg | DELAYED_RELEASE_CAPSULE | Freq: Every day | ORAL | 0 refills | Status: DC

## 2021-07-27 ENCOUNTER — Encounter: Payer: Self-pay | Admitting: Physician Assistant

## 2021-07-27 ENCOUNTER — Other Ambulatory Visit: Payer: Self-pay | Admitting: Physician Assistant

## 2021-07-27 DIAGNOSIS — K219 Gastro-esophageal reflux disease without esophagitis: Secondary | ICD-10-CM

## 2021-07-28 ENCOUNTER — Other Ambulatory Visit: Payer: Self-pay

## 2021-07-28 ENCOUNTER — Ambulatory Visit (INDEPENDENT_AMBULATORY_CARE_PROVIDER_SITE_OTHER): Payer: Self-pay | Admitting: Obstetrics

## 2021-07-28 VITALS — BP 100/66 | Wt 201.0 lb

## 2021-07-28 DIAGNOSIS — Z3480 Encounter for supervision of other normal pregnancy, unspecified trimester: Secondary | ICD-10-CM

## 2021-07-28 DIAGNOSIS — Z3A20 20 weeks gestation of pregnancy: Secondary | ICD-10-CM

## 2021-07-28 DIAGNOSIS — K219 Gastro-esophageal reflux disease without esophagitis: Secondary | ICD-10-CM

## 2021-07-28 LAB — POCT URINALYSIS DIPSTICK OB
Glucose, UA: NEGATIVE
POC,PROTEIN,UA: NEGATIVE

## 2021-07-28 MED ORDER — OMEPRAZOLE 20 MG PO CPDR: 20.0000 mg | DELAYED_RELEASE_CAPSULE | Freq: Every day | ORAL | 0 refills | Status: DC

## 2021-07-28 NOTE — Progress Notes (Signed)
Routine Prenatal Care Visit  Subjective  April Harris is a 30 y.o. G4P0030 at [redacted]w[redacted]d being seen today for ongoing prenatal care.  April Harris is currently monitored for the following issues for this high-risk pregnancy and has Menorrhagia with regular cycle; h/o Hypercholesterolemia; IBS (irritable bowel syndrome); Asthma; Spondylisthesis; Chronic alcoholism in remission ([place]); Tobacco abuse counseling; Family history of drug/alcohol addiction; Family history of depression- in entire maternal lineage; Family history of malignant melanoma- paternal grandfather; Family history of hyperlipidemia-  mom; Plantar warts; Overweight (BMI 25.0-29.9); Vitamin D insufficiency; Supervision of normal pregnancy; and History of loop electrosurgical excision procedure (LEEP) on their problem list.  ----------------------------------------------------------------------------------- Patient reports no complaints.  April Harris has had no vaginal bleeding. Did not want to know the gender but saw the sono report. Avoiding IC now.has marginal previa and will rescan at 28 weeks.  .  .   Jacklyn Shell Fluid denies.  ----------------------------------------------------------------------------------- The following portions of the patient's history were reviewed and updated as appropriate: allergies, current medications, past family history, past medical history, past social history, past surgical history and problem list. Problem list updated.  Objective  Blood pressure 100/66, weight 201 lb (91.2 kg), last menstrual period 03/04/2021. Pregravid weight 182 lb (82.6 kg) Total Weight Gain 19 lb (8.618 kg) Urinalysis: Urine Protein    Urine Glucose    Fetal Status:           General:  Alert, oriented and cooperative. Patient is in no acute distress.  Skin: Skin is warm and dry. No rash noted.   Cardiovascular: Normal heart rate noted  Respiratory: Normal respiratory effort, no problems with respiration noted  Abdomen: Soft, gravid,  appropriate for gestational age.       Pelvic:  Cervical exam deferred        Extremities: Normal range of motion.     Mental Status: Normal mood and affect. Normal behavior. Normal judgment and thought content.   Assessment   30 y.o. G4P0030 at [redacted]w[redacted]d by  12/09/2021, by Last Menstrual Period presenting for routine prenatal visit  Plan   pregnancy4 Problems (from 05/25/21 to present)    Problem Noted Resolved   Supervision of normal pregnancy 05/27/2021 by Rod Can, CNM No   Overview Addendum 07/28/2021  8:18 AM by Imagene Riches, CNM     Nursing Staff Provider  Office Location  [facility] Dating    Language  English Anatomy US  Normal female. Marginal previa- repeat sono at 28 wks  Flu Vaccine   Genetic Screen  NIPS:   TDaP vaccine    Hgb A1C or  GTT Early : Hgb A1C Third trimester :   Covid    LAB RESULTS   Rhogam   Blood Type A/Positive/-- (11/11 0931)   Feeding Plan Breast Antibody Negative (11/11 0931)  Contraception  Rubella 12.30 (11/11 0931)  Circumcision  RPR Non Reactive (11/11 0931)   Pediatrician   HBsAg Negative (11/11 0931)   Support Person [name] HIV Non Reactive (11/11 0931)  Prenatal Classes  Varicella     GBS  (For PCN allergy, check sensitivities)   BTL Consent     VBAC Consent  Pap  05/25/2021    Hgb Electro    Pelvis Tested  CF      SMA                   Preterm labor symptoms and general obstetric precautions including but not limited to vaginal bleeding, contractions, leaking of fluid and fetal movement  were reviewed in detail with the patient. Please refer to After Visit Summary for other counseling recommendations.   Return in about 4 weeks (around 08/25/2021) for return OB.  Mirna Mires, CNM  07/28/2021 8:39 AM

## 2021-07-28 NOTE — Progress Notes (Signed)
ROB - no concerns. RM 7

## 2021-08-02 ENCOUNTER — Other Ambulatory Visit: Payer: Self-pay | Admitting: Physician Assistant

## 2021-08-02 DIAGNOSIS — F418 Other specified anxiety disorders: Secondary | ICD-10-CM

## 2021-08-18 ENCOUNTER — Telehealth: Payer: Self-pay

## 2021-08-18 NOTE — Telephone Encounter (Signed)
Pt calling; is 24wks; for the last 24hrs has had severe itchiness; started out c her exema and winter skin; is about to crawl out of her skin not to scratch right now.  [phone]  Pt states no new body products; states has hives from shoulders to toes; started last night; has taken benadryl, washed in hypoallergenic body wash, tries to keep her mind off of the itching, drinking lots of water; hands are swollen from the itching; feet are not swollen.  Adv Benardyl at HS esp but may take during the day as well but only for about a week; aveeno/oatmeal bath; aveeno lotion; cortaid.  If doesn't help or gets worse and she cannot wait for [place] on Tues to go to [facility] or ED.

## 2021-08-25 ENCOUNTER — Ambulatory Visit (INDEPENDENT_AMBULATORY_CARE_PROVIDER_SITE_OTHER): Payer: PRIVATE HEALTH INSURANCE | Admitting: Licensed Practical Nurse

## 2021-08-25 ENCOUNTER — Other Ambulatory Visit: Payer: Self-pay

## 2021-08-25 ENCOUNTER — Encounter: Payer: Self-pay | Admitting: Licensed Practical Nurse

## 2021-08-25 VITALS — BP 100/60 | Wt 212.0 lb

## 2021-08-25 DIAGNOSIS — Z3A24 24 weeks gestation of pregnancy: Secondary | ICD-10-CM

## 2021-08-25 DIAGNOSIS — Z3480 Encounter for supervision of other normal pregnancy, unspecified trimester: Secondary | ICD-10-CM

## 2021-08-25 LAB — POCT URINALYSIS DIPSTICK OB
Glucose, UA: NEGATIVE
POC,PROTEIN,UA: NEGATIVE

## 2021-08-25 NOTE — Progress Notes (Signed)
Routine Prenatal Care Visit  Subjective  April Harris is a 31 y.o. G4P0030 at [redacted]w[redacted]d being seen today for ongoing prenatal care.  She is currently monitored for the following issues for this low-risk pregnancy and has Menorrhagia with regular cycle; h/o Hypercholesterolemia; IBS (irritable bowel syndrome); Asthma; Spondylisthesis; Chronic alcoholism in remission (HCC); Tobacco abuse counseling; Family history of drug/alcohol addiction; Family history of depression- in entire maternal lineage; Family history of malignant melanoma- paternal grandfather; Family history of hyperlipidemia-  mom; Plantar warts; Overweight (BMI 25.0-29.9); Vitamin D insufficiency; Supervision of normal pregnancy; and History of loop electrosurgical excision procedure (LEEP) on their problem list.  ----------------------------------------------------------------------------------- Patient reports Here with [name], Reports breaking out in hives since last Thursday, has been using Benadryl. Cannot identify any triggers for the hives.  Also has whole body itching.  Admits to having eczema other skin conditions. Declines testing for cholestasis. Encouraged to lool for CBE and BF classes.  Contractions: Not present.  .  Movement: Present. Leaking Fluid denies.  ----------------------------------------------------------------------------------- The following portions of the patient's history were reviewed and updated as appropriate: allergies, current medications, past family history, past medical history, past social history, past surgical history and problem list. Problem list updated.  Objective  Blood pressure 100/60, weight 212 lb (96.2 kg), last menstrual period 03/04/2021. Pregravid weight 182 lb (82.6 kg) Total Weight Gain 30 lb (13.6 kg) Urinalysis: Urine Protein Negative  Urine Glucose Negative  Fetal Status: Fetal Heart Rate (bpm): 155 Fundal Height: 24 cm Movement: Present     General:  Alert, oriented and  cooperative. Patient is in no acute distress.  Skin: Skin is warm and dry. No rash noted.   Cardiovascular: Normal heart rate noted  Respiratory: Normal respiratory effort, no problems with respiration noted  Abdomen: Soft, gravid, appropriate for gestational age. Pain/Pressure: Absent     Pelvic:  Cervical exam deferred        Extremities: Normal range of motion.  Edema: None  Mental Status: Normal mood and affect. Normal behavior. Normal judgment and thought content.   Assessment   31 y.o. G4P0030 at [redacted]w[redacted]d by  12/09/2021, by Last Menstrual Period presenting for routine prenatal visit  Plan   [email or web address] Problems (from 05/25/21 to present)     Problem Noted Resolved   Supervision of normal pregnancy 05/27/2021 by Tresea Mall, CNM No   Overview Addendum 08/25/2021  1:45 PM by Ellwood Sayers, CNM     Nursing Staff Provider  Office Location  [facility] Dating    Language  English Anatomy US  Normal female. Marginal previa- repeat sono at 28 wks  Flu Vaccine  06/01/21 Genetic Screen  NIPS:   TDaP vaccine    Hgb A1C or  GTT Early : Hgb A1C Third trimester :   Covid vaccinated   LAB RESULTS   Rhogam   Blood Type A/Positive/-- (11/11 0931)   Feeding Plan Breast Antibody Negative (11/11 0931)  Contraception  Rubella 12.30 (11/11 0931)  Circumcision  RPR Non Reactive (11/11 0931)   Pediatrician   HBsAg Negative (11/11 0931)   Support Person [name] HIV Non Reactive (11/11 0931)  Prenatal Classes  Varicella immune    GBS  (For PCN allergy, check sensitivities)   BTL Consent     VBAC Consent  Pap  05/25/2021    Hgb Electro    Pelvis Tested  CF      SMA  Preterm labor symptoms and general obstetric precautions including but not limited to vaginal bleeding, contractions, leaking of fluid and fetal movement were reviewed in detail with the patient. Please refer to After Visit Summary for other counseling recommendations.   28 wk labs next visit   Return in  about 3 weeks (around 09/15/2021) for ROB 3-4 weeks for 28 wk labs .  Carie Caddy, CNM  Domingo Pulse, [facility]  08/25/21  2:07 PM

## 2021-08-25 NOTE — Progress Notes (Signed)
ROB - no concerns. RM 4 °

## 2021-09-15 ENCOUNTER — Other Ambulatory Visit: Payer: Self-pay

## 2021-09-15 ENCOUNTER — Ambulatory Visit (INDEPENDENT_AMBULATORY_CARE_PROVIDER_SITE_OTHER): Payer: PRIVATE HEALTH INSURANCE | Admitting: Licensed Practical Nurse

## 2021-09-15 ENCOUNTER — Other Ambulatory Visit: Payer: PRIVATE HEALTH INSURANCE

## 2021-09-15 VITALS — BP 100/60 | Wt 219.0 lb

## 2021-09-15 DIAGNOSIS — Z3480 Encounter for supervision of other normal pregnancy, unspecified trimester: Secondary | ICD-10-CM

## 2021-09-15 DIAGNOSIS — Z131 Encounter for screening for diabetes mellitus: Secondary | ICD-10-CM

## 2021-09-15 LAB — POCT URINALYSIS DIPSTICK OB
Glucose, UA: NEGATIVE
POC,PROTEIN,UA: NEGATIVE

## 2021-09-15 NOTE — Progress Notes (Signed)
ROB- 1 hr GTT, no concerns 

## 2021-09-15 NOTE — Progress Notes (Signed)
Routine Prenatal Care Visit  Subjective  April Harris is a 31 y.o. G4P0030 at [redacted]w[redacted]d being seen today for ongoing prenatal care.  She is currently monitored for the following issues for this high-risk pregnancy and has Menorrhagia with regular cycle; h/o Hypercholesterolemia; IBS (irritable bowel syndrome); Asthma; Spondylisthesis; Chronic alcoholism in remission ([place]); Tobacco abuse counseling; Family history of drug/alcohol addiction; Family history of depression- in entire maternal lineage; Family history of malignant melanoma- paternal grandfather; Family history of hyperlipidemia-  mom; Plantar warts; Overweight (BMI 25.0-29.9); Vitamin D insufficiency; Supervision of normal pregnancy; and History of loop electrosurgical excision procedure (LEEP) on their problem list.  ----------------------------------------------------------------------------------- Patient reports no complaints.  Her baby is very active. Name may be April Harris, but they are not sharing the name with family  .  .   . Leaking Fluid denies.  ----------------------------------------------------------------------------------- The following portions of the patient's history were reviewed and updated as appropriate: allergies, current medications, past family history, past medical history, past social history, past surgical history and problem list. Problem list updated.  Objective  Blood pressure 100/60, weight 219 lb (99.3 kg), last menstrual period 03/04/2021. Pregravid weight 182 lb (82.6 kg) Total Weight Gain 37 lb (16.8 kg) Urinalysis: Urine Protein    Urine Glucose    Fetal Status:           General:  Alert, oriented and cooperative. Patient is in no acute distress.  Skin: Skin is warm and dry. No rash noted.   Cardiovascular: Normal heart rate noted  Respiratory: Normal respiratory effort, no problems with respiration noted  Abdomen: Soft, gravid, appropriate for gestational age.       Pelvic:  Cervical exam  deferred        Extremities: Normal range of motion.  Edema: None  Mental Status: Normal mood and affect. Normal behavior. Normal judgment and thought content.   Assessment   31 y.o. G4P0030 at [redacted]w[redacted]d by  12/09/2021, by Last Menstrual Period presenting for routine prenatal visit  Plan   [email or web address] Problems (from 05/25/21 to present)    Problem Noted Resolved   Supervision of normal pregnancy 05/27/2021 by April Harris, CNM No   Overview Addendum 08/25/2021  2:09 PM by April Harris, April Harris  Office Location  [facility] Dating  [redacted]w[redacted]d  Language  English Anatomy US  Normal female. Marginal previa- repeat sono at 28 wks  Flu Vaccine  06/01/21 Genetic Screen  NIPS:   TDaP vaccine    Hgb A1C or  GTT Early : Hgb A1C Third trimester :   Covid vaccinated   LAB RESULTS   Rhogam  NA Blood Type A/Positive/-- (11/11 0931)   Feeding Plan Breast Antibody Negative (11/11 0931)  Contraception  Rubella 12.30 (11/11 0931)  Circumcision NA RPR Non Reactive (11/11 0931)   Pediatrician   HBsAg Negative (11/11 0931)   Support Person April Harris HIV Non Reactive (11/11 0931)  Prenatal Classes  Varicella immune    GBS  (For PCN allergy, check sensitivities)   BTL Consent     VBAC Consent  Pap  05/25/2021    Hgb Electro    Pelvis Tested  CF      SMA                   Preterm labor symptoms and general obstetric precautions including but not lim 28 weeks labs today.  Return in about 2 weeks (around 09/29/2021) for return OB.   jShe measures a  tad large for dates. WE discussed possibility of a growth scan at 32 weeks.  April Harris, CNM  09/15/2021 4:16 PM

## 2021-09-16 LAB — 28 WEEK RH+PANEL
Basophils Absolute: 0 10*3/uL (ref 0.0–0.2)
Basos: 0 %
EOS (ABSOLUTE): 0 10*3/uL (ref 0.0–0.4)
Eos: 0 %
Gestational Diabetes Screen: 151 mg/dL — ABNORMAL HIGH (ref 70–139)
HIV Screen 4th Generation wRfx: NONREACTIVE
Hematocrit: 33.2 % — ABNORMAL LOW (ref 34.0–46.6)
Hemoglobin: 11.2 g/dL (ref 11.1–15.9)
Immature Grans (Abs): 0.1 10*3/uL (ref 0.0–0.1)
Immature Granulocytes: 1 %
Lymphocytes Absolute: 1.5 10*3/uL (ref 0.7–3.1)
Lymphs: 14 %
MCH: 30.3 pg (ref 26.6–33.0)
MCHC: 33.7 g/dL (ref 31.5–35.7)
MCV: 90 fL (ref 79–97)
Monocytes Absolute: 0.4 10*3/uL (ref 0.1–0.9)
Monocytes: 4 %
Neutrophils Absolute: 8.9 10*3/uL — ABNORMAL HIGH (ref 1.4–7.0)
Neutrophils: 81 %
Platelets: 182 10*3/uL (ref 150–450)
RBC: 3.7 x10E6/uL — ABNORMAL LOW (ref 3.77–5.28)
RDW: 11.8 % (ref 11.7–15.4)
RPR Ser Ql: NONREACTIVE
WBC: 10.9 10*3/uL — ABNORMAL HIGH (ref 3.4–10.8)

## 2021-09-19 ENCOUNTER — Other Ambulatory Visit: Payer: Self-pay | Admitting: Licensed Practical Nurse

## 2021-09-19 DIAGNOSIS — R739 Hyperglycemia, unspecified: Secondary | ICD-10-CM

## 2021-09-19 DIAGNOSIS — Z3400 Encounter for supervision of normal first pregnancy, unspecified trimester: Secondary | ICD-10-CM

## 2021-09-19 NOTE — Progress Notes (Signed)
Elevated 1 hour Pt called by Janett Billow, made aware of need for three hour. Orders placed. Roberto Scales, CNM  Mosetta Pigeon, [facility]  09/19/21  12:26 PM

## 2021-09-22 ENCOUNTER — Other Ambulatory Visit: Payer: Self-pay

## 2021-09-22 ENCOUNTER — Other Ambulatory Visit: Payer: PRIVATE HEALTH INSURANCE

## 2021-09-22 DIAGNOSIS — Z3400 Encounter for supervision of normal first pregnancy, unspecified trimester: Secondary | ICD-10-CM

## 2021-09-22 DIAGNOSIS — R739 Hyperglycemia, unspecified: Secondary | ICD-10-CM

## 2021-10-02 ENCOUNTER — Other Ambulatory Visit: Payer: Self-pay

## 2021-10-02 ENCOUNTER — Ambulatory Visit (INDEPENDENT_AMBULATORY_CARE_PROVIDER_SITE_OTHER): Payer: PRIVATE HEALTH INSURANCE | Admitting: Licensed Practical Nurse

## 2021-10-02 VITALS — BP 102/60 | Wt 222.0 lb

## 2021-10-02 DIAGNOSIS — Z3A30 30 weeks gestation of pregnancy: Secondary | ICD-10-CM

## 2021-10-02 DIAGNOSIS — Z3480 Encounter for supervision of other normal pregnancy, unspecified trimester: Secondary | ICD-10-CM

## 2021-10-02 DIAGNOSIS — O4440 Low lying placenta NOS or without hemorrhage, unspecified trimester: Secondary | ICD-10-CM

## 2021-10-02 LAB — POCT URINALYSIS DIPSTICK OB
Glucose, UA: NEGATIVE
POC,PROTEIN,UA: NEGATIVE

## 2021-10-02 NOTE — Progress Notes (Signed)
rou

## 2021-10-02 NOTE — Progress Notes (Signed)
Routine Prenatal Care Visit  Subjective  April Harris is a 31 y.o. G4P0030 at [redacted]w[redacted]d being seen today for ongoing prenatal care.  She is currently monitored for the following issues for this low-risk pregnancy and has Menorrhagia with regular cycle; h/o Hypercholesterolemia; IBS (irritable bowel syndrome); Asthma; Spondylisthesis; Chronic alcoholism in remission (HCC); Tobacco abuse counseling; Family history of drug/alcohol addiction; Family history of depression- in entire maternal lineage; Family history of malignant melanoma- paternal grandfather; Family history of hyperlipidemia-  mom; Plantar warts; Overweight (BMI 25.0-29.9); Vitamin D insufficiency; Supervision of normal pregnancy; and History of loop electrosurgical excision procedure (LEEP) on their problem list.  ----------------------------------------------------------------------------------- Patient reports experiencing SPD pain,  plans to use chiro, husband has been helping with counter pressure.  -Unable to complete 3 hour.  Has been checking fasting blood sugars at home they are 82-94.  Is willing to check 4 times a day x 2 weeks. -has not had a repeat US at 28 weeks to check placenta location  Contractions: Not present. Vag. Bleeding: None.  Movement: Present. Leaking Fluid denies.  ----------------------------------------------------------------------------------- The following portions of the patient's history were reviewed and updated as appropriate: allergies, current medications, past family history, past medical history, past social history, past surgical history and problem list. Problem list updated.  Objective  Blood pressure 102/60, weight 222 lb (100.7 kg), last menstrual period 03/04/2021. Pregravid weight 182 lb (82.6 kg) Total Weight Gain 40 lb (18.1 kg) Urinalysis: Urine Protein Negative  Urine Glucose Negative  Fetal Status: Fetal Heart Rate (bpm): 150 Fundal Height: 30 cm Movement: Present     General:   Alert, oriented and cooperative. Patient is in no acute distress.  Skin: Skin is warm and dry. No rash noted.   Cardiovascular: Normal heart rate noted  Respiratory: Normal respiratory effort, no problems with respiration noted  Abdomen: Soft, gravid, appropriate for gestational age. Pain/Pressure: Absent     Pelvic:  Cervical exam deferred        Extremities: Normal range of motion.     Mental Status: Normal mood and affect. Normal behavior. Normal judgment and thought content.   Assessment   31 y.o. G4P0030 at 31y.o.  12/09/2021, by Last Menstrual Period presenting for routine prenatal visit  Plan   pregnancy4 Problems (from 05/25/21 to present)     Problem Noted Resolved   Supervision of normal pregnancy 05/27/2021 by Tresea Mall, CNM No   Overview Addendum 08/25/2021  2:09 PM by Ellwood Sayers, CNM     Nursing Staff Provider  Office Location  [facility] Dating  [redacted]w[redacted]d  Language  English Anatomy US  Normal female. Marginal previa- repeat sono at 28 wks  Flu Vaccine  06/01/21 Genetic Screen  NIPS:   TDaP vaccine    Hgb A1C or  GTT Early : Hgb A1C Third trimester :   Covid vaccinated   LAB RESULTS   Rhogam  NA Blood Type A/Positive/-- (11/11 0931)   Feeding Plan Breast Antibody Negative (11/11 0931)  Contraception  Rubella 12.30 (11/11 0931)  Circumcision NA RPR Non Reactive (11/11 0931)   Pediatrician   HBsAg Negative (11/11 0931)   Support Person April Harris HIV Non Reactive (11/11 0931)  Prenatal Classes  Varicella immune    GBS  (For PCN allergy, check sensitivities)   BTL Consent     VBAC Consent  Pap  05/25/2021    Hgb Electro    Pelvis Tested  CF      SMA  Preterm labor symptoms and general obstetric precautions including but not limited to vaginal bleeding, contractions, leaking of fluid and fetal movement were reviewed in detail with the patient. Please refer to After Visit Summary for other counseling recommendations.   Will check blood  sugar 4 times a day x 2 weeks, will come in sooner if she notices many elevated readings (fasting >95, 1 hour>140 or 2 hour>120)  Repeat US ordered  Carie Caddy, CNM  Domingo Pulse, [facility]  10/02/21  5:59 PM

## 2021-10-09 ENCOUNTER — Ambulatory Visit: Admission: RE | Admit: 2021-10-09 | Payer: No Typology Code available for payment source | Source: Ambulatory Visit

## 2021-10-09 LAB — GESTATIONAL GLUCOSE TOLERANCE

## 2021-10-12 ENCOUNTER — Other Ambulatory Visit: Payer: Self-pay

## 2021-10-12 ENCOUNTER — Ambulatory Visit (INDEPENDENT_AMBULATORY_CARE_PROVIDER_SITE_OTHER): Payer: PRIVATE HEALTH INSURANCE

## 2021-10-12 DIAGNOSIS — O4440 Low lying placenta NOS or without hemorrhage, unspecified trimester: Secondary | ICD-10-CM | POA: Diagnosis not present

## 2021-10-17 ENCOUNTER — Encounter: Payer: Self-pay | Admitting: Advanced Practice Midwife

## 2021-10-17 ENCOUNTER — Other Ambulatory Visit: Payer: Self-pay

## 2021-10-17 ENCOUNTER — Ambulatory Visit: Payer: PRIVATE HEALTH INSURANCE | Admitting: Advanced Practice Midwife

## 2021-10-17 VITALS — BP 120/70 | Ht 65.0 in | Wt 220.0 lb

## 2021-10-17 DIAGNOSIS — Z3483 Encounter for supervision of other normal pregnancy, third trimester: Secondary | ICD-10-CM | POA: Diagnosis not present

## 2021-10-17 DIAGNOSIS — Z3A32 32 weeks gestation of pregnancy: Secondary | ICD-10-CM

## 2021-10-17 LAB — POCT URINALYSIS DIPSTICK OB
Glucose, UA: NEGATIVE
POC,PROTEIN,UA: NEGATIVE

## 2021-10-17 NOTE — Patient Instructions (Signed)
Gestational Diabetes Mellitus, Diagnosis Gestational diabetes mellitus is a form of diabetes. It can happen when you are pregnant. The diabetes goes away after you give birth. If you do not get treated for this condition, it may cause problems for you or your baby. What are the causes? This condition is caused by changes in your body when you are pregnant. When these happen: A part of the body called the pancreas does not make enough insulin. The body cannot use insulin in the right way. Sugars cannot get into cells in your body. The sugars stay in your blood. This leads to high blood sugar. What increases the risk? Being older than age 25 when pregnant. Having someone with diabetes in your family. Too much body weight. Having had this condition in the past. Polycystic ovary syndrome. Being pregnant with more than one baby. What are the signs or symptoms? Being thirsty often. Being hungry often. Needing to pee more often. How is this treated? Eat a healthy diet. Get more exercise. Check your blood sugar often. Take insulin and other medicines, if needed. Work with an expert on this condition, if told. Follow these instructions at home: Learn about your diabetes Ask your doctor: How often should I check my blood sugar? Where do I get the equipment? What medicines do I need? When should I take them? Do I need to meet with an educator? Who can I call if I have questions? Where can I find a support group? General instructions Take medicines only as told by your doctor. Stay at a healthy weight. Drink enough fluid to keep your pee pale yellow. Wear an alert bracelet or carry a card that shows you have this condition. Keep all follow-up visits. Where to find more information American Diabetes Association (ADA): diabetes.org Association of Diabetes Care & Education Specialists (ADCES): diabeteseducator.org Centers for Disease Control and Prevention (CDC): cdc.gov American  Pregnancy Association: americanpregnancy.org U.[place]. Department of Agriculture MyPlate: myplate.gov Contact a doctor if: Your blood sugar is at or above 240 mg/dL (13.3 mmol/L). Your blood sugar is at or above 200 mg/dL (11.1 mmol/L) and you have ketones in your pee. You have a fever. You are sick for 2 days or more and you do not get better. You have either of these problems for more than 6 hours: You vomit every time you eat or drink. You have watery poop (diarrhea). Get help right away if: You cannot think clearly. You are not breathing well. You have a lot of ketones in your pee. Your baby seems to move less than normal. Abnormal fluid or blood starts to come out of your vagina. You start having contractions before your due date. You may feel your belly tighten. You have a very bad headache. These symptoms may be an emergency. Get help right away. Call your local emergency services (911 in the [place] Do not wait to see if the symptoms will go away. Do not drive yourself to the hospital. Summary Gestational diabetes is a form of diabetes. It can happen when you are pregnant. This condition occurs when your body cannot make or use insulin in the right way. Eat a healthy diet, exercise, and use medicines or insulin as told by your doctor. Tell your doctor if your blood sugar is high, you have a fever, or you vomit every time you eat or drink. Get help right away if you cannot think clearly, you are not breathing well, or your baby seems to move less than normal. This information   is not intended to replace advice given to you by your health care provider. Make sure you discuss any questions you have with your health care provider. Document Revised: 01/11/2020 Document Reviewed: 01/11/2020 Elsevier Patient Education  2022 Elsevier Inc.  

## 2021-10-17 NOTE — Progress Notes (Signed)
Routine Prenatal Care Visit  Subjective  April Harris is a 31 y.o. G4P0030 at [redacted]w[redacted]d being seen today for ongoing prenatal care.  She is currently monitored for the following issues for this low-risk pregnancy and has Menorrhagia with regular cycle; h/o Hypercholesterolemia; IBS (irritable bowel syndrome); Asthma; Spondylisthesis; Chronic alcoholism in remission (HCC); Tobacco abuse counseling; Family history of drug/alcohol addiction; Family history of depression- in entire maternal lineage; Family history of malignant melanoma- paternal grandfather; Family history of hyperlipidemia-  mom; Plantar warts; Overweight (BMI 25.0-29.9); Vitamin D insufficiency; Supervision of normal pregnancy; and History of loop electrosurgical excision procedure (LEEP) on their problem list.  ----------------------------------------------------------------------------------- Patient reports she checked 5 days of blood sugar from 2/14 to 2/19. She was unable to follow protocols of timing due to IBS issues. Reviewed testing protocols with target range and recommend she check for the next 2 weeks.  Also reviewed recent u/s results. Contractions: Not present. Vag. Bleeding: None.  Movement: Present. Leaking Fluid denies.  ----------------------------------------------------------------------------------- The following portions of the patient's history were reviewed and updated as appropriate: allergies, current medications, past family history, past medical history, past social history, past surgical history and problem list. Problem list updated.  Objective  Blood pressure 120/70, height 5\' 5"  (1.651 m), weight 220 lb (99.8 kg), last menstrual period 03/04/2021. Pregravid weight 182 lb (82.6 kg) Total Weight Gain 38 lb (17.2 kg) Urinalysis: Urine Protein Negative  Urine Glucose Negative   BS log Fasting: 98-108 (not actually fasting due to eating during the night) After meals: usually checking only one hour after  eating 120-140s  Growth scan on 2/23:  Growth 30.8%, AC 19.1% 3 pounds 9 ounces, AFI wnl, placenta 3.94 cm from os, breech  Fetal Status: Fetal Heart Rate (bpm): 141 Fundal Height: 32 cm Movement: Present     General:  Alert, oriented and cooperative. Patient is in no acute distress.  Skin: Skin is warm and dry. No rash noted.   Cardiovascular: Normal heart rate noted  Respiratory: Normal respiratory effort, no problems with respiration noted  Abdomen: Soft, gravid, appropriate for gestational age. Pain/Pressure: Absent     Pelvic:  Cervical exam deferred        Extremities: Normal range of motion.  Edema: None  Mental Status: Normal mood and affect. Normal behavior. Normal judgment and thought content.   Assessment   31 y.o. G4P0030 at [redacted]w[redacted]d by  12/09/2021, by Last Menstrual Period presenting for routine prenatal visit  Plan   pregnancy4 Problems (from 05/25/21 to present)    Problem Noted Resolved   Supervision of normal pregnancy 05/27/2021 by 07/27/2021, CNM No   Overview Addendum 08/25/2021  2:09 PM by 10/23/2021, CNM     Nursing Staff Provider  Office Location  [facility] Dating  [redacted]w[redacted]d  Language  English Anatomy [redacted]w[redacted]d  Normal female. Marginal previa- repeat sono at 28 wks  Flu Vaccine  06/01/21 Genetic Screen  NIPS:   TDaP vaccine    Hgb A1C or  GTT Early : Hgb A1C Third trimester :   Covid vaccinated   LAB RESULTS   Rhogam  NA Blood Type A/Positive/-- (11/11 0931)   Feeding Plan Breast Antibody Negative (11/11 0931)  Contraception  Rubella 12.30 (11/11 0931)  Circumcision NA RPR Non Reactive (11/11 0931)   Pediatrician   HBsAg Negative (11/11 0931)   Support Person [name] HIV Non Reactive (11/11 0931)  Prenatal Classes  Varicella immune    GBS  (For PCN allergy, check sensitivities)  BTL Consent     VBAC Consent  Pap  05/25/2021    Hgb Electro    Pelvis Tested  CF      SMA                GDM testing: Fasting </= 95 1 hr after meals </= 140 2 hr after  meals </= 120  Keep log daily for 2 weeks and bring to next visit   Preterm labor symptoms and general obstetric precautions including but not limited to vaginal bleeding, contractions, leaking of fluid and fetal movement were reviewed in detail with the patient. Please refer to After Visit Summary for other counseling recommendations.   Return in about 2 weeks (around 10/31/2021) for rob.  Tresea Mall, CNM 10/17/2021 8:47 AM

## 2021-10-23 ENCOUNTER — Other Ambulatory Visit: Payer: Self-pay | Admitting: Physician Assistant

## 2021-10-23 DIAGNOSIS — K219 Gastro-esophageal reflux disease without esophagitis: Secondary | ICD-10-CM

## 2021-10-30 ENCOUNTER — Other Ambulatory Visit: Payer: Self-pay

## 2021-10-30 ENCOUNTER — Ambulatory Visit (INDEPENDENT_AMBULATORY_CARE_PROVIDER_SITE_OTHER): Payer: PRIVATE HEALTH INSURANCE | Admitting: Obstetrics

## 2021-10-30 VITALS — BP 126/74 | Wt 227.0 lb

## 2021-10-30 DIAGNOSIS — Z3A34 34 weeks gestation of pregnancy: Secondary | ICD-10-CM

## 2021-10-30 DIAGNOSIS — Z3483 Encounter for supervision of other normal pregnancy, third trimester: Secondary | ICD-10-CM

## 2021-10-30 NOTE — Progress Notes (Signed)
No vb. No lof.  

## 2021-10-30 NOTE — Progress Notes (Signed)
Routine Prenatal Care Visit ? ?Subjective  ?April Harris is a 31 y.o. G4P0030 at [redacted]w[redacted]d being seen today for ongoing prenatal care.  She is currently monitored for the following issues for this high-risk pregnancy and has Menorrhagia with regular cycle; h/o Hypercholesterolemia; IBS (irritable bowel syndrome); Asthma; Spondylisthesis; Chronic alcoholism in remission ([place]); Tobacco abuse counseling; Family history of drug/alcohol addiction; Family history of depression- in entire maternal lineage; Family history of malignant melanoma- paternal grandfather; Family history of hyperlipidemia-  mom; Plantar warts; Overweight (BMI 25.0-29.9); Vitamin D insufficiency; Supervision of normal pregnancy; and History of loop electrosurgical excision procedure (LEEP) on their problem list.  ?----------------------------------------------------------------------------------- ?Patient reports no contractions, no cramping, no leaking and but she shares frustration regarding billing issues and her OB management this pregnancy.  Marland Kitchen   ?Contractions: Not present. Vag. Bleeding: None.  Movement: Present. Leaking Fluid denies.  ?----------------------------------------------------------------------------------- ?The following portions of the patient's history were reviewed and updated as appropriate: allergies, current medications, past family history, past medical history, past social history, past surgical history and problem list. Problem list updated. ? ?Objective  ?Blood pressure 126/74, weight 227 lb (103 kg), last menstrual period 03/04/2021. ?Pregravid weight 182 lb (82.6 kg) Total Weight Gain 45 lb (20.4 kg) ?Urinalysis: Urine Protein    Urine Glucose   ? ?Fetal Status:     Movement: Present    ? ?General:  Alert, oriented and cooperative. Patient is in no acute distress.  ?Skin: Skin is warm and dry. No rash noted.   ?Cardiovascular: Normal heart rate noted  ?Respiratory: Normal respiratory effort, no problems with  respiration noted  ?Abdomen: Soft, gravid, appropriate for gestational age. Pain/Pressure: Absent     ?Pelvic:  Cervical exam deferred        ?Extremities: Normal range of motion.  Edema: None  ?Mental Status: Normal mood and affect. Normal behavior. Normal judgment and thought content.  ? ?Assessment  ? ?31 y.o. G4P0030 at [redacted]w[redacted]d by  12/09/2021, by Last Menstrual Period presenting for routine prenatal visit ? ?Plan  ? ?pregnancy4 Problems (from 05/25/21 to present)   ? ? Problem Noted Resolved  ? Supervision of normal pregnancy 05/27/2021 by Rod Can, CNM No  ? Overview Addendum 08/25/2021  2:09 PM by Allen Derry, CNM  ?   ?Nursing Staff Provider  ?Office Location  [facility] Dating  [redacted]w[redacted]d  ?Language  English Anatomy US  Normal female. Marginal previa- repeat sono at 28 wks  ?Flu Vaccine  06/01/21 Genetic Screen  NIPS:   ?TDaP vaccine    Hgb A1C or  ?GTT Early : Hgb A1C ?Third trimester :   ?Covid vaccinated   LAB RESULTS   ?Rhogam  NA Blood Type A/Positive/-- (11/11 [id number])   ?Feeding Plan Breast Antibody Negative (11/11 0931)  ?Contraception  Rubella 12.30 (11/11 0931)  ?Circumcision NA RPR Non Reactive (11/11 0931)   ?Pediatrician   HBsAg Negative (11/11 0931)   ?Support Person Cristie Hem HIV Non Reactive (11/11 0931)  ?Prenatal Classes  Varicella immune  ?  GBS  (For PCN allergy, check sensitivities)   ?BTL Consent     ?VBAC Consent  Pap  05/25/2021  ?  Hgb Electro    ?Pelvis Tested  CF   ?   SMA   ?     ? ? ?  ?  ? ?  ?  ? ?Preterm labor symptoms and general obstetric precautions including but not limited to vaginal bleeding, contractions, leaking of fluid and fetal movement were reviewed in detail  with the patient. ?Please refer to After Visit Summary for other counseling recommendations.  ?Spent additional time listening to patient's concerns- a growth scan due at 28 wks was performed at 32 wks for example. ?Will order an anesthesia consult for her as she has hx of back problems and is uncertain about  epidural. Plans now on IV meds or nitrous for labor. ? ?Regarding her glucose levels - I reviewed her diet charts and her glucose levels have been in the 90s for FBS, and generally  within a safe range for her 2 hour postprandials. We negotiated having hr do FBS over the next week and do 2 hr Postprandial checks for 2 of her other meals. ? ?No follow-ups on file. ? ?Imagene Riches, CNM  ?10/30/2021 4:52 PM   ? ?

## 2021-11-13 ENCOUNTER — Other Ambulatory Visit: Payer: Self-pay

## 2021-11-13 ENCOUNTER — Encounter: Payer: Self-pay | Admitting: Licensed Practical Nurse

## 2021-11-13 ENCOUNTER — Ambulatory Visit (INDEPENDENT_AMBULATORY_CARE_PROVIDER_SITE_OTHER): Payer: PRIVATE HEALTH INSURANCE | Admitting: Licensed Practical Nurse

## 2021-11-13 VITALS — BP 120/72 | Wt 230.0 lb

## 2021-11-13 DIAGNOSIS — Z3400 Encounter for supervision of normal first pregnancy, unspecified trimester: Secondary | ICD-10-CM

## 2021-11-13 DIAGNOSIS — Z3A36 36 weeks gestation of pregnancy: Secondary | ICD-10-CM

## 2021-11-13 DIAGNOSIS — Z3685 Encounter for antenatal screening for Streptococcus B: Secondary | ICD-10-CM

## 2021-11-13 LAB — POCT URINALYSIS DIPSTICK OB
Glucose, UA: NEGATIVE
POC,PROTEIN,UA: NEGATIVE

## 2021-11-13 NOTE — Progress Notes (Signed)
Routine Prenatal Care Visit ? ?Subjective  ?April Harris is a 31 y.o. G4P0030 at [redacted]w[redacted]d being seen today for ongoing prenatal care.  She is currently monitored for the following issues for this low-risk pregnancy and has Menorrhagia with regular cycle; h/o Hypercholesterolemia; IBS (irritable bowel syndrome); Asthma; Spondylisthesis; Chronic alcoholism in remission ([facility]); Tobacco abuse counseling; Family history of drug/alcohol addiction; Family history of depression- in entire maternal lineage; Family history of malignant melanoma- paternal grandfather; Family history of hyperlipidemia-  mom; Plantar warts; Overweight (BMI 25.0-29.9); Vitamin D insufficiency; Supervision of normal pregnancy; and History of loop electrosurgical excision procedure (LEEP) on their problem list.  ?----------------------------------------------------------------------------------- ?Patient reports continues to have SPD, the discomfort depends how she is using her body,  has not seen chiro.   ?-Not checking sugars regularly, is better about checking fasting, fasting's 90-100, 2 hour after meals 90-115 ?-Breech today, HO given, reviewed ECV if continues to  be breech  ?Contractions: Not present. Vag. Bleeding: None.  Movement: Present. Leaking Fluid denies.  ?----------------------------------------------------------------------------------- ?The following portions of the patient's history were reviewed and updated as appropriate: allergies, current medications, past family history, past medical history, past social history, past surgical history and problem list. Problem list updated. ? ?Objective  ?Blood pressure 120/72, weight 230 lb (104.3 kg), last menstrual period 03/04/2021. ?Pregravid weight 182 lb (82.6 kg) Total Weight Gain 48 lb (21.8 kg) ?Urinalysis: Urine Protein    Urine Glucose   ? ?Fetal Status: Fetal Heart Rate (bpm): 140 Fundal Height: 36 cm Movement: Present    ? ?General:  Alert, oriented and cooperative. Patient is  in no acute distress.  ?Skin: Skin is warm and dry. No rash noted.   ?Cardiovascular: Normal heart rate noted  ?Respiratory: Normal respiratory effort, no problems with respiration noted  ?Abdomen: Soft, gravid, appropriate for gestational age. Pain/Pressure: Absent     ?Pelvic:  Cervical exam deferred        ?Extremities: Normal range of motion.  Edema: Trace  ?Mental Status: Normal mood and affect. Normal behavior. Normal judgment and thought content.  ? ?Assessment  ? ?31 y.o. G4P0030 at [redacted]w[redacted]d by  12/09/2021, by Last Menstrual Period presenting for routine prenatal visit ? ?Plan  ? ?pregnancy4 Problems (from 05/25/21 to present)   ? ? Problem Noted Resolved  ? Supervision of normal pregnancy 05/27/2021 by Tresea Mall, CNM No  ? Overview Addendum 08/25/2021  2:09 PM by Ellwood Sayers, CNM  ?   ?Nursing Staff Provider  ?Office Location  [facility] Dating  [redacted]w[redacted]d  ?Language  English Anatomy US  Normal female. Marginal previa- repeat sono at 28 wks  ?Flu Vaccine  06/01/21 Genetic Screen  NIPS:   ?TDaP vaccine    Hgb A1C or  ?GTT Early : Hgb A1C ?Third trimester :   ?Covid vaccinated   LAB RESULTS   ?Rhogam  NA Blood Type A/Positive/-- (11/11 8315)   ?Feeding Plan Breast Antibody Negative (11/11 0931)  ?Contraception  Rubella 12.30 (11/11 0931)  ?Circumcision NA RPR Non Reactive (11/11 0931)   ?Pediatrician   HBsAg Negative (11/11 0931)   ?Support Person April Harris HIV Non Reactive (11/11 0931)  ?Prenatal Classes  Varicella immune  ?  GBS  (For PCN allergy, check sensitivities)   ?BTL Consent     ?VBAC Consent  Pap  05/25/2021  ?  Hgb Electro    ?Pelvis Tested  CF   ?   SMA   ?     ? ? ?  ?  ? ?  ?  ? ?  Preterm labor symptoms and general obstetric precautions including but not limited to vaginal bleeding, contractions, leaking of fluid and fetal movement were reviewed in detail with the patient. ?Please refer to After Visit Summary for other counseling recommendations.  ? ?Return in about 1 week (around 11/20/2021) for  ROB. ? ?April Harris, CNM  ?Domingo Pulse, [facility]  ?11/13/21  ?8:46 AM  ? ? ?

## 2021-11-17 LAB — STREP GP B CULTURE+RFLX: Strep Gp B Culture+Rflx: NEGATIVE

## 2021-11-21 ENCOUNTER — Ambulatory Visit (INDEPENDENT_AMBULATORY_CARE_PROVIDER_SITE_OTHER): Payer: PRIVATE HEALTH INSURANCE | Admitting: Family Medicine

## 2021-11-21 VITALS — BP 118/70 | Wt 234.0 lb

## 2021-11-21 DIAGNOSIS — O329XX0 Maternal care for malpresentation of fetus, unspecified, not applicable or unspecified: Secondary | ICD-10-CM

## 2021-11-21 DIAGNOSIS — Z3483 Encounter for supervision of other normal pregnancy, third trimester: Secondary | ICD-10-CM

## 2021-11-21 NOTE — Progress Notes (Signed)
? ?  PRENATAL VISIT NOTE ? ?Subjective:  ?April Harris is a 31 y.o. G4P0030 at [redacted]w[redacted]d being seen today for ongoing prenatal care.  She is currently monitored for the following issues for this high-risk pregnancy and has Menorrhagia with regular cycle; h/o Hypercholesterolemia; IBS (irritable bowel syndrome); Asthma; Spondylisthesis; Chronic alcoholism in remission ([place]); Tobacco abuse counseling; Family history of drug/alcohol addiction; Family history of depression- in entire maternal lineage; Family history of malignant melanoma- paternal grandfather; Family history of hyperlipidemia-  mom; Plantar warts; Overweight (BMI 25.0-29.9); Vitamin D insufficiency; Supervision of normal pregnancy; and History of loop electrosurgical excision procedure (LEEP) on their problem list. ? ?Patient reports no complaints.  Contractions: Not present. Vag. Bleeding: None.  Movement: Present. Denies leaking of fluid.  ? ?The following portions of the patient's history were reviewed and updated as appropriate: allergies, current medications, past family history, past medical history, past social history, past surgical history and problem list.  ? ?Objective:  ? ?Vitals:  ? 11/21/21 1435  ?BP: 118/70  ?Weight: 234 lb (106.1 kg)  ? ?Fetal Status: Fetal Heart Rate (bpm): 145 Fundal Height: 37 cm Movement: Present  Presentation: Pilar Plate Breech ? ?General:  Alert, oriented and cooperative. Patient is in no acute distress.  ?Skin: Skin is warm and dry. No rash noted.   ?Cardiovascular: Normal heart rate noted  ?Respiratory: Normal respiratory effort, no problems with respiration noted  ?Abdomen: Soft, gravid, appropriate for gestational age.  Pain/Pressure: Absent     ?Pelvic: Cervical exam performed in the presence of a chaperone Dilation: Closed Effacement (%): Thick Station: Ballotable  ?Extremities: Normal range of motion.  Edema: Trace  ?Mental Status: Normal mood and affect. Normal behavior. Normal judgment and thought content.   ? ?Assessment and Plan:  ?Pregnancy: G4P0030 at [redacted]w[redacted]d ?1. Encounter for supervision of other normal pregnancy in third trimester ?TWG=52 lb (23.6 kg) which is above goal ?Ready for baby ? ?2. Breech ?Schedule for ECV on 4/6 - 0500 AM, NPO after midnight ?Patient aware ?Discussed risks/benefits of ECV. Patient strongly desires ECV and vaginal birth in general ? ? ?Term labor symptoms and general obstetric precautions including but not limited to vaginal bleeding, contractions, leaking of fluid and fetal movement were reviewed in detail with the patient. ?Please refer to After Visit Summary for other counseling recommendations.  ? ?Return in about 1 week (around 11/28/2021) for Routine prenatal care. ? ?No future appointments. ? ?Caren Macadam, MD ? ?

## 2021-11-21 NOTE — Progress Notes (Signed)
ROB- cervix check, no concerns 

## 2021-11-23 ENCOUNTER — Encounter: Payer: Self-pay | Admitting: Obstetrics & Gynecology

## 2021-11-23 ENCOUNTER — Observation Stay
Admission: EM | Admit: 2021-11-23 | Discharge: 2021-11-23 | Disposition: A | Discharge status: Home / Self Care | Payer: PRIVATE HEALTH INSURANCE | Attending: Obstetrics & Gynecology | Admitting: Obstetrics & Gynecology

## 2021-11-23 ENCOUNTER — Other Ambulatory Visit: Payer: Self-pay

## 2021-11-23 DIAGNOSIS — Z3A37 37 weeks gestation of pregnancy: Secondary | ICD-10-CM | POA: Insufficient documentation

## 2021-11-23 DIAGNOSIS — O329XX0 Maternal care for malpresentation of fetus, unspecified, not applicable or unspecified: Secondary | ICD-10-CM | POA: Diagnosis present

## 2021-11-23 DIAGNOSIS — O321XX0 Maternal care for breech presentation, not applicable or unspecified: Secondary | ICD-10-CM | POA: Diagnosis present

## 2021-11-23 LAB — CBC WITH DIFFERENTIAL/PLATELET
Abs Immature Granulocytes: 0.04 10*3/uL (ref 0.00–0.07)
Basophils Absolute: 0 10*3/uL (ref 0.0–0.1)
Basophils Relative: 0 %
Eosinophils Absolute: 0.1 10*3/uL (ref 0.0–0.5)
Eosinophils Relative: 1 %
HCT: 33 % — ABNORMAL LOW (ref 36.0–46.0)
Hemoglobin: 10.6 g/dL — ABNORMAL LOW (ref 12.0–15.0)
Immature Granulocytes: 0 %
Lymphocytes Relative: 19 %
Lymphs Abs: 1.8 10*3/uL (ref 0.7–4.0)
MCH: 28.4 pg (ref 26.0–34.0)
MCHC: 32.1 g/dL (ref 30.0–36.0)
MCV: 88.5 fL (ref 80.0–100.0)
Monocytes Absolute: 0.6 10*3/uL (ref 0.1–1.0)
Monocytes Relative: 6 %
Neutro Abs: 6.9 10*3/uL (ref 1.7–7.7)
Neutrophils Relative %: 74 %
Platelets: 157 10*3/uL (ref 150–400)
RBC: 3.73 MIL/uL — ABNORMAL LOW (ref 3.87–5.11)
RDW: 13.4 % (ref 11.5–15.5)
WBC: 9.4 10*3/uL (ref 4.0–10.5)
nRBC: 0 % (ref 0.0–0.2)

## 2021-11-23 LAB — TYPE AND SCREEN
ABO/RH(D): A POS
Antibody Screen: NEGATIVE

## 2021-11-23 MED ORDER — MINERAL OIL LIGHT OIL
TOPICAL_OIL | Freq: Once | Status: DC
  Filled 2021-11-23: qty 10

## 2021-11-23 MED ORDER — TERBUTALINE SULFATE 1 MG/ML IJ SOLN
0.2500 mg | Freq: Once | INTRAMUSCULAR | Status: AC
  Administered 2021-11-23: 0.25 mg via SUBCUTANEOUS
  Filled 2021-11-23: qty 1

## 2021-11-23 NOTE — Progress Notes (Signed)
Pt discharged home per Kenton Kingfisher, MD order after ECV.  Pt stable and ambulatory and an After Visit Summary was printed and given to the patient. Discharge education completed with patient/family including follow up instructions, appointments, and medication list. Pt received labor and bleeding precautions. Patient able to verbalize understanding, all questions fully answered upon discharge. Patient instructed to return to ED, call 911, or call MD for any changes in condition. Pt discharged home via personal vehicle with support person.  ? ? ?

## 2021-11-23 NOTE — Procedures (Signed)
ECV ? ?After informed consent, including reviewing the potential risks of pain, failure to turn fetus,  rupture of membranes, uterine abruption, bleeding and fetal death.  Patient opted to proceed with the procedure.  ?Ultrasound at bedside confirms breech presentation.  Back right sided. Complete beech orientation. ?Terbutaline 0.25 mg SQ was given  ?ECV was attempted under Ultrasound guidance.   The version was not successful after three attempts (forward and backwards).  ?FHR was 160 and reactive before and after the procedure.  Toco showed mild uterine irritability. ?Pt. Tolerated the procedure well.  There were no complications and the pt will be discharged to home after 1 -2 hours of fetal and uterine monitoring. ? ?Annamarie Major, MD, FACOG ?[facility], [facility] ?11/23/2021  9:16 AM ? ?

## 2021-11-23 NOTE — H&P (Signed)
Obstetrics Admission History & Physical ?  ?No chief complaint on file. 31 y.o. G4P0030 @ [redacted]w[redacted]d (12/09/2021, by Last Menstrual Period). Admitted on 11/23/2021:   ?Patient Active Problem List  ? Diagnosis Date Noted  ? Failed external cephalic version 33/95/1884  ? History of loop electrosurgical excision procedure (LEEP) 06/01/2021  ? Supervision of normal pregnancy 05/27/2021  ? Vitamin D insufficiency 08/11/2019  ? Chronic alcoholism in remission ([facility]) 05/30/2019  ? Tobacco abuse counseling 05/30/2019  ? Family history of drug/alcohol addiction 05/30/2019  ? Family history of depression- in entire maternal lineage 05/30/2019  ? Family history of malignant melanoma- paternal grandfather 05/30/2019  ? Family history of hyperlipidemia-  mom 05/30/2019  ? Plantar warts 05/30/2019  ? Overweight (BMI 25.0-29.9) 05/30/2019  ? Spondylisthesis 05/27/2019  ? h/o Hypercholesterolemia 11/12/2017  ? Menorrhagia with regular cycle 11/07/2017  ? IBS (irritable bowel syndrome) 11/16/2009  ? Asthma 04/21/2009  ?  ? ?Presents for ECV for breech.  ? ?Prenatal care at: at [facility]. Pregnancy complicated by  prior LEEP, prior alcohol abuse, breech . ? ?ROS: A review of systems was performed and negative, except as stated in the above HPI. ?PMHx:  ?Past Medical History:  ?Diagnosis Date  ? Asthma   ? H/O LEEP (loop electrosurgical excision procedure) of cervix complicating pregnancy   ? History of colposcopy   ? IBS (irritable bowel syndrome)   ? LGSIL on Pap smear of cervix   ? ?PSHx:  ?Past Surgical History:  ?Procedure Laterality Date  ? COLPOSCOPY    ? DILATION AND CURETTAGE OF UTERUS    ? INDUCED ABORTION    ? TONSILLECTOMY    ? ?Medications:  ?Medications Prior to Admission  ?Medication Sig Dispense Refill Last Dose  ? Calcium Carbonate (CALCIUM 500 PO) Take by mouth.     ? cetirizine (ZYRTEC) 5 MG tablet Take 5 mg by mouth daily.     ? omeprazole (PRILOSEC) 20 MG capsule TAKE 1 CAPSULE BY MOUTH EVERY DAY 90 capsule 0    ? Prenatal Vit-Fe Fumarate-FA (PRENATAL MULTIVITAMIN) TABS tablet Take 1 tablet by mouth daily at 12 noon.     ? sertraline (ZOLOFT) 50 MG tablet TAKE 1 TABLET BY MOUTH EVERYDAY AT BEDTIME 90 tablet 1   ? VITAMIN D PO Take 50 Int'l Units by mouth daily.     ? ?Allergies: is allergic to penicillins. ?OBHx:  ?OB History  ?Gravida Para Term Preterm AB Living  ?4       3 0  ?SAB IAB Ectopic Multiple Live Births  ?2 1     0  ?  ?# Outcome Date GA Lbr Len/2nd Weight Sex Delivery Anes PTL Lv  ?4 Current           ?3 SAB 2010          ?2 SAB 2008          ?1 IAB 2007          ?  ?Obstetric Comments  ?Cervical LEEP 06/20/2020  ? ?[id number] except as detailed in HPI.Marland Kitchen  No family history of birth defects. ?Soc Hx: Recreational drug use: none and Pregnancy welcomed ? ?Objective:  ? ?Vitals:  ? 11/23/21 0600 11/23/21 0744  ?BP: 119/73 115/69  ?Pulse: 75 71  ?Resp:  18  ?Temp:  97.8 ?F (36.6 ?C)  ? ?Constitutional: Well nourished, well developed female in no acute distress.  ?HEENT: normal ?Skin: Warm and dry.  ?Cardiovascular:Regular rate and rhythm.   ?  Extremity: trace to 1+ bilateral pedal edema ?Respiratory: Clear to auscultation bilateral. Normal respiratory effort ?Abdomen: gravid, ND, FHT present, mild tenderness on exam ?Back: no CVAT ?Neuro: DTRs 2+, Cranial nerves grossly intact ?Psych: Alert and Oriented x3. No memory deficits. Normal mood and affect.  ?MS: normal gait, normal bilateral lower extremity ROM/strength/stability. ? ?Assessment & Plan:  ? ?31 y.o. G4P0030 @ [redacted]w[redacted]d, Admitted on 11/23/2021:for ECV procedure ?  Plan [place], Terb, Procedure ? ? ?Annamarie Major, MD, FACOG ?[facility], [facility] ?11/23/2021  9:21 AM ? ?

## 2021-11-23 NOTE — Progress Notes (Signed)
Pt April Harris 31 y.o. presents to labor and delivery for scheduled external cephalic version. Pt is a G4P0030 at [redacted]w[redacted]d . Pt denies signs and symptons consistent with rupture of membranes or active vaginal bleeding. Pt denies contractions and states positive fetal movement. Vital signs obtained and within normal limits. Provider notified of pt. ? ?

## 2021-11-23 NOTE — Discharge Summary (Signed)
Physician Discharge Summary  ?Patient ID: ?April Harris ?MRN: [id number] ?DOB/AGE: 03-14-1991 31 y.o. ? ?Admit date: 11/23/2021 ?Discharge date: 11/23/2021 ? ?Admission Diagnoses: Breech, 37 weeks ? ?Discharge Diagnoses: Same, ?Principal Problem: ?  Failed external cephalic version ? ? ?Discharged Condition: good ? ?Hospital Course: Pt seen and examined.  Ultrasound confirms breech.  See procedure note.  Pt to be discharged and follow up in office.  Plan CS 39 weeks. ? ?Consults: None ? ?Significant Diagnostic Studies: A NST procedure was performed with FHR monitoring and a normal baseline established, appropriate time of 20-40 minutes of evaluation, and accels >2 seen w 15x15 characteristics.  Results show a REACTIVE NST.  ? ?Treatments: none ? ?Discharge Exam: ?Blood pressure 115/69, pulse 71, temperature 97.8 ?F (36.6 ?C), temperature source Oral, resp. rate 18, height 5\' 5"  (1.651 m), weight 106.1 kg, last menstrual period 03/04/2021. ?Stable and unchanged ? ?Disposition: Discharge disposition: 01-Home or Self Care ? ? ? ? ? ? ?Discharge Instructions   ? ? Call MD for:   Complete by: As directed ?  ? Worsening contractions or pain; leakage of fluid; bleeding.  ? Diet - low sodium heart healthy   Complete by: As directed ?  ? Increase activity slowly   Complete by: As directed ?  ? ?  ? ?Allergies as of 11/23/2021   ? ?   Reactions  ? Penicillins   ? ?  ? ?  ?Medication List  ?  ? ?TAKE these medications   ? ?CALCIUM 500 PO ?Take by mouth. ?  ?cetirizine 5 MG tablet ?Commonly known as: ZYRTEC ?Take 5 mg by mouth daily. ?  ?omeprazole 20 MG capsule ?Commonly known as: PRILOSEC ?TAKE 1 CAPSULE BY MOUTH EVERY DAY ?  ?prenatal multivitamin Tabs tablet ?Take 1 tablet by mouth daily at 12 noon. ?  ?sertraline 50 MG tablet ?Commonly known as: ZOLOFT ?TAKE 1 TABLET BY MOUTH EVERYDAY AT BEDTIME ?  ?VITAMIN D PO ?Take 50 Int'l Units by mouth daily. ?  ? ?  ? ? ? ?Signed: ?01/23/2022 ?11/23/2021, 9:19 AM ? ? ?

## 2021-11-24 LAB — RPR: RPR Ser Ql: NONREACTIVE

## 2021-11-28 ENCOUNTER — Ambulatory Visit (INDEPENDENT_AMBULATORY_CARE_PROVIDER_SITE_OTHER): Payer: PRIVATE HEALTH INSURANCE | Admitting: Obstetrics

## 2021-11-28 VITALS — BP 110/70 | Wt 235.2 lb

## 2021-11-28 DIAGNOSIS — Z3483 Encounter for supervision of other normal pregnancy, third trimester: Secondary | ICD-10-CM

## 2021-11-28 DIAGNOSIS — Z3A38 38 weeks gestation of pregnancy: Secondary | ICD-10-CM

## 2021-11-28 NOTE — Progress Notes (Signed)
No concerns. 

## 2021-11-28 NOTE — Progress Notes (Signed)
Routine Prenatal Care Visit ? ?Subjective  ?April Harris is a 31 y.o. G4P0030 at [redacted]w[redacted]d being seen today for ongoing prenatal care.  She is currently monitored for the following issues for this high-risk pregnancy and has Menorrhagia with regular cycle; h/o Hypercholesterolemia; IBS (irritable bowel syndrome); Asthma; Spondylisthesis; Chronic alcoholism in remission ([place]); Tobacco abuse counseling; Family history of drug/alcohol addiction; Family history of depression- in entire maternal lineage; Family history of malignant melanoma- paternal grandfather; Family history of hyperlipidemia-  mom; Plantar warts; Overweight (BMI 25.0-29.9); Vitamin D insufficiency; Supervision of normal pregnancy; History of loop electrosurgical excision procedure (LEEP); and Failed external cephalic version on their problem list.  ?----------------------------------------------------------------------------------- ?Patient reports no complaints.   ? .  .   . Leaking Fluid denies.  ?----------------------------------------------------------------------------------- ?The following portions of the patient's history were reviewed and updated as appropriate: allergies, current medications, past family history, past medical history, past social history, past surgical history and problem list. Problem list updated. ? ?Objective  ?Blood pressure 110/70, weight 235 lb 3.2 oz (106.7 kg), last menstrual period 03/04/2021. ?Pregravid weight 182 lb (82.6 kg) Total Weight Gain 53 lb 3.2 oz (24.1 kg) ?Urinalysis: Urine Protein    Urine Glucose   ? ?Fetal Status:          ? ?General:  Alert, oriented and cooperative. Patient is in no acute distress.  ?Skin: Skin is warm and dry. No rash noted.   ?Cardiovascular: Normal heart rate noted  ?Respiratory: Normal respiratory effort, no problems with respiration noted  ?Abdomen: Soft, gravid, appropriate for gestational age.       ?Pelvic:  Cervical exam deferred        ?Extremities: Normal range of  motion.     ?Mental Status: Normal mood and affect. Normal behavior. Normal judgment and thought content.  ? ?Assessment  ? ?31 y.o. G4P0030 at [redacted]w[redacted]d by  12/09/2021, by Last Menstrual Period presenting for routine prenatal visit ?Breech presentation. ?For primary CS at 39 weeks ? ?Plan  ? ?pregnancy4 Problems (from 05/25/21 to present)   ? Problem Noted Resolved  ? Supervision of normal pregnancy 05/27/2021 by Rod Can, CNM No  ? Overview Addendum 08/25/2021  2:09 PM by Allen Derry, CNM  ?   ?Nursing Staff Provider  ?Office Location  [facility] Dating  [redacted]w[redacted]d  ?Language  English Anatomy US  Normal female. Marginal previa- repeat sono at 28 wks  ?Flu Vaccine  06/01/21 Genetic Screen  NIPS:   ?TDaP vaccine    Hgb A1C or  ?GTT Early : Hgb A1C ?Third trimester :   ?Covid vaccinated   LAB RESULTS   ?Rhogam  NA Blood Type A/Positive/-- (11/11 [id number])   ?Feeding Plan Breast Antibody Negative (11/11 0931)  ?Contraception  Rubella 12.30 (11/11 0931)  ?Circumcision NA RPR Non Reactive (11/11 0931)   ?Pediatrician   HBsAg Negative (11/11 0931)   ?Support Person Cristie Hem HIV Non Reactive (11/11 0931)  ?Prenatal Classes  Varicella immune  ?  GBS  (For PCN allergy, check sensitivities)   ?BTL Consent     ?VBAC Consent  Pap  05/25/2021  ?  Hgb Electro    ?Pelvis Tested  CF   ?   SMA   ?     ? ? ?  ?  ?  ?  ? ?Term labor symptoms and general obstetric precautions including but not limited to vaginal bleeding, contractions, leaking of fluid and fetal movement were reviewed in detail with the patient. ?Please refer to After Visit  Summary for other counseling recommendations.  ?She had an unsuccessful ECV attempt on the 6th. Here today for discussion and scheduling of her primary CS. ?After several calls and inquiries, Dr. Gilman Schmidt is setting up her CS for 12/05/2021. ?She will be contacted by the scheduler re the time and other instructions. Today reviewed the risk with CS and how things will likely proced on labor and Delivery.  Breech confirmed with bedside ultrasound today. ? ?Return if symptoms worsen or fail to improve. ? ?Imagene Riches, CNM  ?11/28/2021 4:53 PM   ? ?

## 2021-11-29 ENCOUNTER — Telehealth: Payer: Self-pay | Admitting: Obstetrics and Gynecology

## 2021-11-29 NOTE — Telephone Encounter (Signed)
Called patient to schedule cesarean section with Schuman, Constant assisting ? ?DOS 12/05/21 ? ?H&P n/a ? ?Pre-admit phone call appointment to be requested - date and time will be included on H&P paper work. Also all appointments will be updated on pt MyChart. Explained that this appointment has a call window. Based on the time scheduled will indicate if the call will be received within a 4 hour window before 1:00 or after. ? ?Advised that pt may also receive calls from the hospital pharmacy and pre-service center. ? ?Confirmed pt has [employer] as primary insurance. ?No secondary insurance.  ? ?1wk incision chk 4/24 @ 10:35 w Constant ?6wk PP 5/31 @ 10:35 w Constant ?

## 2021-11-29 NOTE — Telephone Encounter (Signed)
-----   Message from Natale Milch, MD sent at 11/28/2021  4:50 PM EDT ----- ?Regarding: cesarean section ?Surgery Booking Request ?Patient Full Name:  April Harris  ?MRN: [id number]  ?DOB: 1990-12-25  ?Surgeon: Natale Milch, MD  ?Requested Surgery Date and Time: 12/05/2021- 9 am ?Primary Diagnosis AND Code: Breech ?Secondary Diagnosis and Code:  ?Surgical Procedure: Cesarean Section ?RNFA Requested?: No ?L&D Notification: Yes ?Admission Status: surgery admit ?Length of Surgery: 50 min ?Special Case Needs: No ?H&P: No ?Phone Interview???:  Yes ?Interpreter: No ?Medical Clearance:  No ?Special Scheduling Instructions: No ?Any known health/anesthesia issues, diabetes, sleep apnea, latex allergy, defibrillator/pacemaker?: No ?Acuity: P3 ?  (P1 highest, P2 delay may cause harm, P3 low, elective gyn, P4 lowest) ?Post op follow up visits: 1 week- will need to be with a different provider.  ? ? ?

## 2021-11-29 NOTE — Telephone Encounter (Signed)
Orders placed.

## 2021-12-01 ENCOUNTER — Encounter
Admission: RE | Admit: 2021-12-01 | Discharge: 2021-12-01 | Disposition: A | Discharge status: Home / Self Care | Payer: No Typology Code available for payment source | Source: Ambulatory Visit | Attending: Obstetrics and Gynecology | Admitting: Obstetrics and Gynecology

## 2021-12-01 ENCOUNTER — Other Ambulatory Visit: Payer: Self-pay

## 2021-12-01 HISTORY — DX: Anxiety disorder, unspecified: F41.9

## 2021-12-01 HISTORY — DX: Depression, unspecified: F32.A

## 2021-12-01 HISTORY — DX: Gastro-esophageal reflux disease without esophagitis: K21.9

## 2021-12-01 NOTE — Patient Instructions (Addendum)
Your procedure is scheduled on: 12/05/21 Report to the [facility] at 7:30 am to be escorted to [facility] You may call for any questions regarding visitors etc [phone].  Remember: Instructions that are not followed completely may result in serious medical risk, up to and including death, or upon the discretion of your surgeon and anesthesiologist your surgery may need to be rescheduled.     _X__ 1. Do not eat food or drink liquids after midnight the night before your procedure.                 No gum chewing or hard candies.   __X__2.  On the morning of surgery brush your teeth with toothpaste and water, you                 may rinse your mouth with mouthwash if you wish.  Do not swallow any              toothpaste of mouthwash.     _X__ 3.  No Alcohol for 24 hours before or after surgery.   _X__ 4.  Do Not Smoke or use e-cigarettes For 24 Hours Prior to Your Surgery.                 Do not use any chewable tobacco products for at least 6 hours prior to                 surgery.  ____  5.  Bring all medications with you on the day of surgery if instructed.   __X__  6.  Notify your doctor if there is any change in your medical condition      (cold, fever, infections).     Do not wear jewelry, make-up, hairpins, clips or nail polish. Do not wear lotions, powders, or perfumes.  Do not shave body hair 48 hours prior to surgery. Men may shave face and neck. Do not bring valuables to the hospital.    [facility] is not responsible for any belongings or valuables.  Contacts, dentures/partials or body piercings may not be worn into surgery. Bring a case for your contacts, glasses or hearing aids, a denture cup will be supplied. Leave your suitcase in the car. After surgery it may be brought to your room. For patients admitted to the hospital, discharge time is determined by your treatment team.   Patients discharged the day of surgery will not be allowed to drive home.   Please  read over the following fact sheets that you were given:   MRSA Information, CHG soap  __X__ Take these medicines the morning of surgery with A SIP OF WATER:    1. cetirizine (ZYRTEC) 10 MG tablet  2. omeprazole (PRILOSEC) 20 MG capsule  3.   4.  5.  6.  ____ Fleet Enema (as directed)   __X__ Use CHG Soap/SAGE wipes as directed  ____ Use inhalers on the day of surgery  ____ Stop metformin/Janumet/Farxiga 2 days prior to surgery    ____ Take 1/2 of usual insulin dose the night before surgery. No insulin the morning          of surgery.   ____ Stop Blood Thinners Coumadin/Plavix/Xarelto/Pleta/Pradaxa/Eliquis/Effient/Aspirin  on   Or contact your Surgeon, Cardiologist or Medical Doctor regarding  ability to stop your blood thinners  __X__ Stop Anti-inflammatories 7 days before surgery such as Advil, Ibuprofen, Motrin,  BC or Goodies Powder, Naprosyn, Naproxen, Aleve, Aspirin    ____ Stop all  herbal supplements, fish oil or vitamin E until after surgery.    ____ Bring C-Pap to the hospital.

## 2021-12-04 ENCOUNTER — Encounter
Admission: RE | Admit: 2021-12-04 | Discharge: 2021-12-04 | Disposition: A | Discharge status: Home / Self Care | Payer: PRIVATE HEALTH INSURANCE | Source: Ambulatory Visit | Attending: Obstetrics and Gynecology | Admitting: Obstetrics and Gynecology

## 2021-12-04 DIAGNOSIS — Z01812 Encounter for preprocedural laboratory examination: Secondary | ICD-10-CM | POA: Insufficient documentation

## 2021-12-04 LAB — TYPE AND SCREEN
ABO/RH(D): A POS
Antibody Screen: NEGATIVE
Extend sample reason: UNDETERMINED

## 2021-12-04 LAB — CBC
HCT: 33.9 % — ABNORMAL LOW (ref 36.0–46.0)
Hemoglobin: 10.9 g/dL — ABNORMAL LOW (ref 12.0–15.0)
MCH: 28.7 pg (ref 26.0–34.0)
MCHC: 32.2 g/dL (ref 30.0–36.0)
MCV: 89.2 fL (ref 80.0–100.0)
Platelets: 159 10*3/uL (ref 150–400)
RBC: 3.8 MIL/uL — ABNORMAL LOW (ref 3.87–5.11)
RDW: 14.1 % (ref 11.5–15.5)
WBC: 9.8 10*3/uL (ref 4.0–10.5)
nRBC: 0 % (ref 0.0–0.2)

## 2021-12-04 LAB — RAPID HIV SCREEN (HIV 1/2 AB+AG)
HIV 1/2 Antibodies: NONREACTIVE
HIV-1 P24 Antigen - HIV24: NONREACTIVE

## 2021-12-05 ENCOUNTER — Encounter: Payer: Self-pay | Admitting: Obstetrics and Gynecology

## 2021-12-05 ENCOUNTER — Other Ambulatory Visit: Payer: Self-pay

## 2021-12-05 ENCOUNTER — Inpatient Hospital Stay: Payer: PRIVATE HEALTH INSURANCE | Admitting: Anesthesiology

## 2021-12-05 ENCOUNTER — Encounter
Admission: RE | Disposition: A | Discharge status: Home / Self Care | Payer: No Typology Code available for payment source | Source: Home / Self Care | Attending: Obstetrics and Gynecology

## 2021-12-05 ENCOUNTER — Inpatient Hospital Stay
Admission: RE | Admit: 2021-12-05 | Discharge: 2021-12-07 | DRG: 788 | Disposition: A | Discharge status: Home / Self Care | Payer: PRIVATE HEALTH INSURANCE | Attending: Obstetrics and Gynecology | Admitting: Obstetrics and Gynecology

## 2021-12-05 DIAGNOSIS — Z0000 Encounter for general adult medical examination without abnormal findings: Secondary | ICD-10-CM

## 2021-12-05 DIAGNOSIS — Z3403 Encounter for supervision of normal first pregnancy, third trimester: Secondary | ICD-10-CM | POA: Diagnosis not present

## 2021-12-05 DIAGNOSIS — Z3A39 39 weeks gestation of pregnancy: Secondary | ICD-10-CM

## 2021-12-05 DIAGNOSIS — O24424 Gestational diabetes mellitus in childbirth, insulin controlled: Secondary | ICD-10-CM | POA: Diagnosis not present

## 2021-12-05 DIAGNOSIS — O321XX0 Maternal care for breech presentation, not applicable or unspecified: Principal | ICD-10-CM | POA: Diagnosis present

## 2021-12-05 DIAGNOSIS — Z880 Allergy status to penicillin: Secondary | ICD-10-CM

## 2021-12-05 DIAGNOSIS — Z87891 Personal history of nicotine dependence: Secondary | ICD-10-CM

## 2021-12-05 DIAGNOSIS — O24420 Gestational diabetes mellitus in childbirth, diet controlled: Secondary | ICD-10-CM | POA: Diagnosis present

## 2021-12-05 LAB — RPR: RPR Ser Ql: NONREACTIVE

## 2021-12-05 SURGERY — Surgical Case
Anesthesia: Spinal

## 2021-12-05 MED ORDER — PRENATAL MULTIVITAMIN CH
1.0000 | ORAL_TABLET | Freq: Every day | ORAL | Status: DC
  Administered 2021-12-05 – 2021-12-07 (×3): 1 via ORAL
  Filled 2021-12-05 (×3): qty 1

## 2021-12-05 MED ORDER — MEPERIDINE HCL 25 MG/ML IJ SOLN: 6.2500 mg | INTRAMUSCULAR | Status: DC | PRN

## 2021-12-05 MED ORDER — GENTAMICIN SULFATE 40 MG/ML IJ SOLN
5.0000 mg/kg | INTRAVENOUS | Status: AC
  Administered 2021-12-05: 530 mg via INTRAVENOUS
  Filled 2021-12-05: qty 13.25

## 2021-12-05 MED ORDER — BUPIVACAINE IN DEXTROSE 0.75-8.25 % IT SOLN
INTRATHECAL | Status: DC | PRN
  Administered 2021-12-05: 1.6 mL via INTRATHECAL

## 2021-12-05 MED ORDER — LORATADINE 10 MG PO TABS
10.0000 mg | ORAL_TABLET | Freq: Every day | ORAL | Status: DC
  Administered 2021-12-05 – 2021-12-07 (×3): 10 mg via ORAL
  Filled 2021-12-05 (×3): qty 1

## 2021-12-05 MED ORDER — SERTRALINE HCL 25 MG PO TABS
50.0000 mg | ORAL_TABLET | Freq: Every day | ORAL | Status: DC
  Administered 2021-12-05 – 2021-12-06 (×2): 50 mg via ORAL
  Filled 2021-12-05 (×2): qty 2

## 2021-12-05 MED ORDER — WITCH HAZEL-GLYCERIN EX PADS: 1.0000 "application " | MEDICATED_PAD | CUTANEOUS | Status: DC | PRN

## 2021-12-05 MED ORDER — HYDROMORPHONE HCL 1 MG/ML IJ SOLN: 0.2000 mg | INTRAMUSCULAR | Status: DC | PRN

## 2021-12-05 MED ORDER — ZOLPIDEM TARTRATE 5 MG PO TABS: 5.0000 mg | ORAL_TABLET | Freq: Every evening | ORAL | Status: DC | PRN

## 2021-12-05 MED ORDER — SIMETHICONE 80 MG PO CHEW
80.0000 mg | CHEWABLE_TABLET | Freq: Three times a day (TID) | ORAL | Status: DC
  Administered 2021-12-05 – 2021-12-07 (×5): 80 mg via ORAL
  Filled 2021-12-05 (×6): qty 1

## 2021-12-05 MED ORDER — VANCOMYCIN HCL 1.5 G IV SOLR
1500.0000 mg | Freq: Once | INTRAVENOUS | Status: AC
  Administered 2021-12-05: 1500 mg via INTRAVENOUS
  Filled 2021-12-05: qty 1500

## 2021-12-05 MED ORDER — PHENYLEPHRINE HCL-NACL 20-0.9 MG/250ML-% IV SOLN
INTRAVENOUS | Status: DC | PRN
Start: 2021-12-05 — End: 2021-12-05
  Administered 2021-12-05: 50 ug/min via INTRAVENOUS

## 2021-12-05 MED ORDER — DIPHENHYDRAMINE HCL 50 MG/ML IJ SOLN: 12.5000 mg | INTRAMUSCULAR | Status: DC | PRN

## 2021-12-05 MED ORDER — OXYTOCIN-SODIUM CHLORIDE 30-0.9 UT/500ML-% IV SOLN: 2.5000 [IU]/h | INTRAVENOUS | Status: AC

## 2021-12-05 MED ORDER — BISACODYL 10 MG RE SUPP
10.0000 mg | Freq: Every day | RECTAL | Status: DC | PRN
Start: 2021-12-05 — End: 2021-12-07

## 2021-12-05 MED ORDER — SIMETHICONE 80 MG PO CHEW: 80.0000 mg | CHEWABLE_TABLET | ORAL | Status: DC | PRN

## 2021-12-05 MED ORDER — ONDANSETRON HCL 4 MG/2ML IJ SOLN
INTRAMUSCULAR | Status: DC | PRN
  Administered 2021-12-05: 4 mg via INTRAVENOUS

## 2021-12-05 MED ORDER — NALOXONE HCL 4 MG/10ML IJ SOLN
1.0000 ug/kg/h | INTRAVENOUS | Status: DC | PRN
  Filled 2021-12-05: qty 5

## 2021-12-05 MED ORDER — FERROUS SULFATE 325 (65 FE) MG PO TABS
325.0000 mg | ORAL_TABLET | Freq: Two times a day (BID) | ORAL | Status: DC
  Administered 2021-12-05 – 2021-12-07 (×4): 325 mg via ORAL
  Filled 2021-12-05 (×4): qty 1

## 2021-12-05 MED ORDER — SOD CITRATE-CITRIC ACID 500-334 MG/5ML PO SOLN: 30.0000 mL | ORAL | Status: AC

## 2021-12-05 MED ORDER — KETOROLAC TROMETHAMINE 30 MG/ML IJ SOLN
INTRAMUSCULAR | Status: DC | PRN
Start: 2021-12-05 — End: 2021-12-05
  Administered 2021-12-05: 30 mg via INTRAVENOUS

## 2021-12-05 MED ORDER — SENNOSIDES-DOCUSATE SODIUM 8.6-50 MG PO TABS
2.0000 | ORAL_TABLET | ORAL | Status: DC
  Administered 2021-12-05 – 2021-12-07 (×3): 2 via ORAL
  Filled 2021-12-05 (×3): qty 2

## 2021-12-05 MED ORDER — KETOROLAC TROMETHAMINE 30 MG/ML IJ SOLN
INTRAMUSCULAR | Status: AC
  Filled 2021-12-05: qty 1

## 2021-12-05 MED ORDER — OXYCODONE HCL 5 MG PO TABS: 5.0000 mg | ORAL_TABLET | Freq: Once | ORAL | Status: DC | PRN

## 2021-12-05 MED ORDER — OXYTOCIN-SODIUM CHLORIDE 30-0.9 UT/500ML-% IV SOLN
INTRAVENOUS | Status: DC | PRN
  Administered 2021-12-05: 300 mL via INTRAVENOUS

## 2021-12-05 MED ORDER — BUPIVACAINE 0.25 % ON-Q PUMP DUAL CATH 400 ML
400.0000 mL | INJECTION | Status: DC
  Filled 2021-12-05: qty 400

## 2021-12-05 MED ORDER — SODIUM CHLORIDE 0.9% FLUSH: 3.0000 mL | INTRAVENOUS | Status: DC | PRN

## 2021-12-05 MED ORDER — ONDANSETRON HCL 4 MG/2ML IJ SOLN
INTRAMUSCULAR | Status: AC
  Filled 2021-12-05: qty 2

## 2021-12-05 MED ORDER — MENTHOL 3 MG MT LOZG
1.0000 | LOZENGE | OROMUCOSAL | Status: DC | PRN
  Filled 2021-12-05: qty 9

## 2021-12-05 MED ORDER — ONDANSETRON HCL 4 MG/2ML IJ SOLN
4.0000 mg | Freq: Three times a day (TID) | INTRAMUSCULAR | Status: DC | PRN
  Administered 2021-12-06: 4 mg via INTRAVENOUS
  Filled 2021-12-05: qty 2

## 2021-12-05 MED ORDER — DIBUCAINE (PERIANAL) 1 % EX OINT: 1.0000 "application " | TOPICAL_OINTMENT | CUTANEOUS | Status: DC | PRN

## 2021-12-05 MED ORDER — BUPIVACAINE HCL (PF) 0.5 % IJ SOLN
INTRAMUSCULAR | Status: AC
  Administered 2021-12-05: 50 mg
  Filled 2021-12-05: qty 10

## 2021-12-05 MED ORDER — SOD CITRATE-CITRIC ACID 500-334 MG/5ML PO SOLN
ORAL | Status: AC
  Administered 2021-12-05: 30 mL via ORAL
  Filled 2021-12-05: qty 15

## 2021-12-05 MED ORDER — FENTANYL CITRATE (PF) 100 MCG/2ML IJ SOLN
INTRAMUSCULAR | Status: DC | PRN
  Administered 2021-12-05: 15 ug via INTRATHECAL

## 2021-12-05 MED ORDER — OXYCODONE HCL 5 MG PO TABS
5.0000 mg | ORAL_TABLET | ORAL | Status: DC | PRN
  Administered 2021-12-06: 5 mg via ORAL
  Administered 2021-12-07: 10 mg via ORAL
  Filled 2021-12-05: qty 2
  Filled 2021-12-05: qty 1

## 2021-12-05 MED ORDER — NALOXONE HCL 0.4 MG/ML IJ SOLN: 0.4000 mg | INTRAMUSCULAR | Status: DC | PRN

## 2021-12-05 MED ORDER — DIPHENHYDRAMINE HCL 25 MG PO CAPS: 25.0000 mg | ORAL_CAPSULE | ORAL | Status: DC | PRN

## 2021-12-05 MED ORDER — FENTANYL CITRATE (PF) 100 MCG/2ML IJ SOLN
INTRAMUSCULAR | Status: AC
  Filled 2021-12-05: qty 2

## 2021-12-05 MED ORDER — MORPHINE SULFATE (PF) 0.5 MG/ML IJ SOLN
INTRAMUSCULAR | Status: DC | PRN
Start: 2021-12-05 — End: 2021-12-05
  Administered 2021-12-05: .1 mg via INTRATHECAL

## 2021-12-05 MED ORDER — DIPHENHYDRAMINE HCL 25 MG PO CAPS
25.0000 mg | ORAL_CAPSULE | Freq: Four times a day (QID) | ORAL | Status: DC | PRN
Start: 2021-12-05 — End: 2021-12-07

## 2021-12-05 MED ORDER — KETOROLAC TROMETHAMINE 30 MG/ML IJ SOLN: 30.0000 mg | Freq: Four times a day (QID) | INTRAMUSCULAR | Status: AC

## 2021-12-05 MED ORDER — OXYCODONE HCL 5 MG/5ML PO SOLN
5.0000 mg | Freq: Once | ORAL | Status: DC | PRN
  Filled 2021-12-05: qty 5

## 2021-12-05 MED ORDER — BUPIVACAINE HCL (PF) 0.5 % IJ SOLN
INTRAMUSCULAR | Status: DC | PRN
  Administered 2021-12-05: 10 mL

## 2021-12-05 MED ORDER — LACTATED RINGERS IV SOLN: INTRAVENOUS | Status: DC

## 2021-12-05 MED ORDER — PHENYLEPHRINE HCL (PRESSORS) 10 MG/ML IV SOLN
INTRAVENOUS | Status: AC
  Filled 2021-12-05: qty 1

## 2021-12-05 MED ORDER — IBUPROFEN 600 MG PO TABS
600.0000 mg | ORAL_TABLET | Freq: Four times a day (QID) | ORAL | Status: DC
  Administered 2021-12-06 – 2021-12-07 (×4): 600 mg via ORAL
  Filled 2021-12-05 (×4): qty 1

## 2021-12-05 MED ORDER — COCONUT OIL OIL
1.0000 | TOPICAL_OIL | Status: DC | PRN
Start: 2021-12-05 — End: 2021-12-07

## 2021-12-05 MED ORDER — ACETAMINOPHEN 325 MG PO TABS
650.0000 mg | ORAL_TABLET | Freq: Four times a day (QID) | ORAL | Status: AC
  Administered 2021-12-05 – 2021-12-06 (×4): 650 mg via ORAL
  Filled 2021-12-05 (×5): qty 2

## 2021-12-05 MED ORDER — 0.9 % SODIUM CHLORIDE (POUR BTL) OPTIME
TOPICAL | Status: DC | PRN
  Administered 2021-12-05: 1000 mL

## 2021-12-05 MED ORDER — OXYTOCIN-SODIUM CHLORIDE 30-0.9 UT/500ML-% IV SOLN
INTRAVENOUS | Status: AC
  Administered 2021-12-05: 2.5 [IU]/h via INTRAVENOUS
  Filled 2021-12-05: qty 1000

## 2021-12-05 MED ORDER — PANTOPRAZOLE SODIUM 40 MG PO TBEC
40.0000 mg | DELAYED_RELEASE_TABLET | Freq: Every day | ORAL | Status: DC
  Administered 2021-12-06 (×2): 40 mg via ORAL
  Filled 2021-12-05 (×3): qty 1

## 2021-12-05 MED ORDER — FENTANYL CITRATE (PF) 100 MCG/2ML IJ SOLN: 25.0000 ug | INTRAMUSCULAR | Status: DC | PRN

## 2021-12-05 MED ORDER — FLEET ENEMA 7-19 GM/118ML RE ENEM: 1.0000 | ENEMA | Freq: Every day | RECTAL | Status: DC | PRN

## 2021-12-05 MED ORDER — MORPHINE SULFATE (PF) 0.5 MG/ML IJ SOLN
INTRAMUSCULAR | Status: AC
  Filled 2021-12-05: qty 10

## 2021-12-05 MED ORDER — KETOROLAC TROMETHAMINE 30 MG/ML IJ SOLN
30.0000 mg | Freq: Four times a day (QID) | INTRAMUSCULAR | Status: AC
  Administered 2021-12-05 – 2021-12-06 (×4): 30 mg via INTRAVENOUS
  Filled 2021-12-05 (×4): qty 1

## 2021-12-05 MED ORDER — OXYCODONE HCL 5 MG PO TABS: 5.0000 mg | ORAL_TABLET | ORAL | Status: AC | PRN

## 2021-12-05 SURGICAL SUPPLY — 27 items
CHLORAPREP W/TINT 26 (MISCELLANEOUS) ×4 IMPLANT
DERMABOND ADVANCED (GAUZE/BANDAGES/DRESSINGS) ×1
DERMABOND ADVANCED .7 DNX12 (GAUZE/BANDAGES/DRESSINGS) ×1 IMPLANT
DRSG OPSITE POSTOP 4X10 (GAUZE/BANDAGES/DRESSINGS) ×2 IMPLANT
ELECT CAUTERY BLADE 6.4 (BLADE) ×2 IMPLANT
ELECT REM PT RETURN 9FT ADLT (ELECTROSURGICAL) ×2
ELECTRODE REM PT RTRN 9FT ADLT (ELECTROSURGICAL) ×1 IMPLANT
GLOVE BIOGEL PI IND STRL 6.5 (GLOVE) ×2 IMPLANT
GLOVE BIOGEL PI INDICATOR 6.5 (GLOVE) ×2
GOWN STRL REUS W/ TWL LRG LVL3 (GOWN DISPOSABLE) ×1 IMPLANT
GOWN STRL REUS W/ TWL XL LVL3 (GOWN DISPOSABLE) ×2 IMPLANT
GOWN STRL REUS W/TWL LRG LVL3 (GOWN DISPOSABLE) ×1
GOWN STRL REUS W/TWL XL LVL3 (GOWN DISPOSABLE) ×2
MANIFOLD NEPTUNE II (INSTRUMENTS) ×2 IMPLANT
MAT PREVALON FULL STRYKER (MISCELLANEOUS) ×2 IMPLANT
NS IRRIG 1000ML POUR BTL (IV SOLUTION) ×2 IMPLANT
PACK C SECTION AR (MISCELLANEOUS) ×2 IMPLANT
PAD OB MATERNITY 4.3X12.25 (PERSONAL CARE ITEMS) ×2 IMPLANT
PAD PREP 24X41 OB/GYN DISP (PERSONAL CARE ITEMS) ×2 IMPLANT
SCRUB CHG 4% DYNA-HEX 4OZ (MISCELLANEOUS) ×2 IMPLANT
SUT MNCRL AB 4-0 PS2 18 (SUTURE) ×2 IMPLANT
SUT PLAIN 3-0 (SUTURE) ×2 IMPLANT
SUT VIC AB 0 CT1 36 (SUTURE) ×6 IMPLANT
SUT VIC AB 2-0 CT1 36 (SUTURE) ×2 IMPLANT
SUT VICRYL 0 AB UR-6 (SUTURE) ×1 IMPLANT
SYR 30ML LL (SYRINGE) ×4 IMPLANT
WATER STERILE IRR 500ML POUR (IV SOLUTION) ×2 IMPLANT

## 2021-12-05 NOTE — Anesthesia Preprocedure Evaluation (Signed)
Anesthesia Evaluation  ?Patient identified by MRN, date of birth, ID band ?Patient awake ? ? ? ?Reviewed: ?Allergy & Precautions, NPO status , Patient's Chart, lab work & pertinent test results ? ?History of Anesthesia Complications ?Negative for: history of anesthetic complications ? ?Airway ?Mallampati: III ? ?TM Distance: >3 FB ?Neck ROM: full ? ? ? Dental ? ?(+) Chipped ?  ?Pulmonary ?neg shortness of breath, asthma , former smoker,  ?  ?Pulmonary exam normal ? ? ? ? ? ? ? Cardiovascular ?Exercise Tolerance: Good ?(-) hypertension(-) angina(-) Past MI Normal cardiovascular exam ? ? ?  ?Neuro/Psych ?  ? GI/Hepatic ?GERD  Controlled,  ?Endo/Other  ? ? Renal/GU ?  ?negative genitourinary ?  ?Musculoskeletal ? ? Abdominal ?  ?Peds ? Hematology ?negative hematology ROS ?(+)   ?Anesthesia Other Findings ?Past Medical History: ?No date: Anxiety ?No date: Asthma ?No date: Depression ?No date: GERD (gastroesophageal reflux disease) ?No date: H/O LEEP (loop electrosurgical excision procedure) of cervix  ?complicating pregnancy ?No date: History of colposcopy ?No date: IBS (irritable bowel syndrome) ?No date: LGSIL on Pap smear of cervix ? ?Past Surgical History: ?No date: COLPOSCOPY ?No date: DILATION AND CURETTAGE OF UTERUS ?No date: INDUCED ABORTION ?No date: TONSILLECTOMY ? ?BMI   ? Body Mass Index: 39.11 kg/m?  ?  ? ? Reproductive/Obstetrics ?(+) Pregnancy ? ?  ? ? ? ? ? ? ? ? ? ? ? ? ? ?  ?  ? ? ? ? ? ? ? ? ?Anesthesia Physical ?Anesthesia Plan ? ?ASA: 3 ? ?Anesthesia Plan: Spinal  ? ?Post-op Pain Management:   ? ?Induction:  ? ?PONV Risk Score and Plan:  ? ?Airway Management Planned: Natural Airway and Nasal Cannula ? ?Additional Equipment:  ? ?Intra-op Plan:  ? ?Post-operative Plan:  ? ?Informed Consent: I have reviewed the patients History and Physical, chart, labs and discussed the procedure including the risks, benefits and alternatives for the proposed anesthesia with the  patient or authorized representative who has indicated his/her understanding and acceptance.  ? ? ? ?Dental Advisory Given ? ?Plan Discussed with: Anesthesiologist, CRNA and Surgeon ? ?Anesthesia Plan Comments: (Patient reports no bleeding problems and no anticoagulant use. ? ?Plan for spinal with backup GA ? ?Patient consented for risks of anesthesia including but not limited to:  ?- adverse reactions to medications ?- damage to eyes, teeth, lips or other oral mucosa ?- nerve damage due to positioning  ?- risk of bleeding, infection and or nerve damage from spinal that could lead to paralysis ?- risk of headache or failed spinal ?- damage to teeth, lips or other oral mucosa ?- sore throat or hoarseness ?- damage to heart, brain, nerves, lungs, other parts of body or loss of life ? ?Patient voiced understanding.)  ? ? ? ? ? ? ?Anesthesia Quick Evaluation ? ?

## 2021-12-05 NOTE — Anesthesia Procedure Notes (Addendum)
Spinal ? ?Patient location during procedure: OR ?Start time: 12/05/2021 10:02 AM ?End time: 12/05/2021 10:11 AM ?Reason for block: surgical anesthesia ?Staffing ?Performed: other anesthesia staff  ?Anesthesiologist: Piscitello, Precious Haws, MD ?Resident/CRNA: Aline Brochure, CRNA ?Other anesthesia staff: Timmothy Euler, RN ?Preanesthetic Checklist ?Completed: patient identified, IV checked, site marked, risks and benefits discussed, surgical consent, monitors and equipment checked, pre-op evaluation and timeout performed ?Spinal Block ?Patient position: sitting ?Prep: Betadine ?Patient monitoring: heart rate, continuous pulse ox, blood pressure and cardiac monitor ?Approach: midline ?Location: L4-5 ?Injection technique: single-shot ?Needle ?Needle type: Introducer and Pencan  ?Needle gauge: 24 G ?Needle length: 9 cm ?Assessment ?Events: CSF return ?Additional Notes ?Negative paresthesia. Negative blood return. Positive free-flowing CSF. Expiration date of kit checked and confirmed. Patient tolerated procedure well, without complications. ? ? ? ? ? ?

## 2021-12-05 NOTE — Lactation Note (Signed)
This note was copied from a baby's chart. ?Lactation Consultation Note ? ?Patient Name: April Harris ?EPPIR'J Date: 12/05/2021 ?Reason for consult: L&D Initial assessment;Primapara;Term;Other (Comment) (breech; c-section) ?Age:33 hours ? ?Initial lactation visit. ? ?Maternal Data ?Has patient been taught Hand Expression?: Yes ?Does the patient have breastfeeding experience prior to this delivery?: No ? ?Feeding ?Mother's Current Feeding Choice: Breast Milk ? ?Baby born breech via c-section <2hrs ago; skin to skin with mom. Mom desires to breastfeed. ? ?LATCH Score ?Latch: Repeated attempts needed to sustain latch, nipple held in mouth throughout feeding, stimulation needed to elicit sucking reflex. ? ?Audible Swallowing: None ? ?Type of Nipple: Everted at rest and after stimulation ? ?Comfort (Breast/Nipple): Soft / non-tender ? ?Hold (Positioning): Assistance needed to correctly position infant at breast and maintain latch. ? ?LATCH Score: 6 ? ?LC at bedside, assisted with positioning and pillow support for cross-cradle hold, sandwiched tissue and attempted several times for latch. At one point baby did latch but sat with nipple in mouth. ?Re-positioned baby into football hold on opposite breast, again multiple attempts to latch were unsuccessful. Baby appeared to have a lot of saliva in the mouth possibly hindering feeding and desire to eat. ? ?Lactation Tools Discussed/Used ?  ? ?Interventions ?Interventions: Breast feeding basics reviewed;Assisted with latch;Skin to skin;Hand express;Adjust position;Support pillows;Position options;Education ? ?Reviewed skin to skin, early hunger cues, feeding with cues, position/latch, typical feeding patterns within first 24 hours, and output expectations. ? ?Discharge ?  ? ?Consult Status ?Consult Status: Follow-up from L&D ? ? ? ?Danford Bad ?12/05/2021, 12:51 PM ? ? ? ?

## 2021-12-05 NOTE — Lactation Note (Signed)
This note was copied from a baby's chart. ?Lactation Consultation Note ? ?Patient Name: Girl Evett Kassa ?YOVZC'H Date: 12/05/2021 ?Reason for consult: RN request ?Age:33 hours ? ?Baby was not showing interest in breastfeeding attempt per RN request. Explained to parent that due to breech positioning, it is possible that infant swallowed amniotic fluid which is full of protein and high in calories so she may not be hungry.  ? ?Maternal Data ?Has patient been taught Hand Expression?: Yes ?Does the patient have breastfeeding experience prior to this delivery?: No ? ? ?Feeding ?Mother's Current Feeding Choice: Breast Milk ? ?LATCH Score ?Latch: Too sleepy or reluctant, no latch achieved, no sucking elicited. ? ?Audible Swallowing: None ? ?Type of Nipple: Everted at rest and after stimulation ? ?Comfort (Breast/Nipple): Soft / non-tender ? ?Hold (Positioning): Full assist, staff holds infant at breast ? ?LATCH Score: 4 ? ? ?Interventions ?Interventions: Breast feeding basics reviewed;Assisted with latch;Skin to skin;Hand express;Adjust position;Support pillows;Position options;Education ? ?Feeding Plan: ?Feed baby on demand, look for hunger cues ?Skin to skin ? ?Consult Status ?Consult Status: Follow-up ? ? ? ?Marcedes Tech Enzo Montgomery ?12/05/2021, 2:47 PM ? ? ? ?

## 2021-12-05 NOTE — Op Note (Signed)
Cesarean Section Procedure Note ?12/05/21 ? ?Pre-operative Diagnosis: ? 1. Breech presentation ?Post-operative Diagnosis: same, delivered. ?Procedure: Primary Low Transverse Cesarean Section  ?Surgeon: Adelene Idler MD  ?Assistant(s): Catalina Antigua MD - No other skilled surgical assistant available. ?Anesthesia: Spinal ?Estimated Blood Loss: 635 cc ?Complications: None; patient tolerated the procedure well.  ?Disposition: PACU - hemodynamically stable. ?Condition: stable ?  ?Findings: ?A female infant in the cephalic presentation. ?Amniotic fluid - clear  ?Birth weight: 8 lbs 1oz ?Apgars of 8 and 9.  ?Intact placenta with a three-vessel cord. Grossly normal uterus, tubes and ovaries bilaterally.  No intraabdominal adhesions were noted. ?  ?Procedure Details  ?  ?The patient was taken to operating room, identified as the correct patient and the procedure verified as C-Section Delivery. A time out was held and the above information confirmed. ?After induction of anesthesia, the patient was draped and prepped in the usual sterile manner. A Pfannenstiel incision was made and carried down through the subcutaneous tissue to the fascia. Fascial incision was made and extended transversely with the Mayo scissors. The fascia was separated from the underlying rectus tissue superiorly and inferiorly. The peritoneum was identified and entered bluntly. Peritoneal incision was extended longitudinally. A low transverse hysterotomy was made. The fetus was delivered atraumatically with the typical breech delivery maneuvers. The umbilical cord was clamped x2 and cut and the infant was handed to the awaiting pediatricians. The placenta was removed intact and appeared normal with a 3-vessel cord. The uterus was exteriorized and cleared of all clot and debris. The hysterotomy was closed with running sutures of 0 Vicryl suture.  A second imbricating layer was placed with the same suture. Excellent hemostasis was observed. A single  figure of eight suture was placed at the midline The uterus was returned to the abdomen. The pelvis was inspected and again, excellent hemostasis was noted. The peritoneum was closed with a running stitch of 2-0 Vicryl. The On Q Pain pump system was then placed.  Trocars were placed through the abdominal wall into the subfascial space and these were used to thread the silver soaker cathaters into place.The rectus muscles were inspected and were hemostatic. The rectus fascia was then reapproximated with running sutures of 0-vicryl, with careful placement not to incorporate the cathaters. Subcutaneous tissues are then irrigated with saline and hemostasis assured with the bovie. The subcutaneous fat was approximated with 3-0 plain and a running stitch.  The skin was closed with 4-0 monocryl suture in a subcuticular fashion followed by skin adhesive. The cathaters are flushed each with 5 mL of Bupivicaine and stabilized into place with dressing. Instrument, sponge, and needle counts were correct prior to the abdominal closure and at the conclusion of the case.  ?The patient tolerated the procedure well and was transferred to the recovery room in stable condition.  ? ?Dr. Jolayne Panther assisted with this case. This was a high level case requiring a Financial controller. No other assistant was readily available. She assisted with retraction of tissue and application of fundal pressure during the case.  ? ? ?Natale Milch MD ?[facility], [facility] ?12/05/21 ?11:38 AM ? ? ?

## 2021-12-05 NOTE — Transfer of Care (Signed)
Immediate Anesthesia Transfer of Care Note ? ?Patient: April Harris ? ?Procedure(s) Performed: CESAREAN SECTION ? ?Patient Location: LDR5 ? ?Anesthesia Type:Spinal ? ?Level of Consciousness: awake, alert  and oriented ? ?Airway & Oxygen Therapy: Patient Spontanous Breathing ? ?Post-op Assessment: Report given to RN and Post -op Vital signs reviewed and stable ? ?Post vital signs: Reviewed and stable ? ?Last Vitals:  ?Vitals Value Taken Time  ?BP 123/69 12/05/21 1131  ?Temp 36.4 ?C 12/05/21 1130  ?Pulse    ?Resp 19 12/05/21 1137  ?SpO2 100   ?Vitals shown include unvalidated device data. ? ?Last Pain:  ?Vitals:  ? 12/05/21 1130  ?TempSrc: Oral  ?PainSc: 0-No pain  ?   ? ?  ? ?Complications: No notable events documented. ?

## 2021-12-05 NOTE — H&P (Signed)
April Harris is an 31 y.o. female.   ?Chief Complaint: Breech presentation ?HPI: Patient presents today for scheduled cesarean section secondary to breech presentation at term. [place] is off the unit for repairs but breech presentation confirmed with leopold maneuvers.  ? ?pregnancy4 Problems (from 05/25/21 to present)   ? ? Problem Noted Resolved  ? Supervision of normal pregnancy 05/27/2021 by Rod Can, CNM No  ? Overview Addendum 08/25/2021  2:09 PM by Allen Derry, CNM  ?   ?Nursing Staff Provider  ?Office Location  [facility] Dating  [redacted]w[redacted]d  ?Language  English Anatomy US  Normal female. Marginal previa- repeat sono at 28 wks  ?Flu Vaccine  06/01/21 Genetic Screen  NIPS: declines  ?TDaP vaccine   Not received Hgb A1C or  ?GTT Early : Hgb A1C ?Third trimester : 151, 3 hour cancelled, monitored glucose values  ?Covid vaccinated   LAB RESULTS   ?Rhogam  NA Blood Type A/Positive/-- (11/11 [id number])   ?Feeding Plan Breast Antibody Negative (11/11 0931)  ?Contraception  Rubella 12.30 (11/11 0931)  ?Circumcision NA RPR Non Reactive (11/11 0931)   ?Pediatrician   HBsAg Negative (11/11 0931)   ?Support Person Cristie Hem HIV Non Reactive (11/11 0931)  ?Prenatal Classes  Varicella immune  ?  GBS  (For PCN allergy, check sensitivities)   ?BTL Consent     ?VBAC Consent  Pap  05/25/2021  ?  Hgb Electro    ?Pelvis Tested  CF   ?   SMA   ?     ? ?  ?  ? ?  ? ? ? ?Past Medical History:  ?Diagnosis Date  ? Anxiety   ? Asthma   ? Depression   ? GERD (gastroesophageal reflux disease)   ? H/O LEEP (loop electrosurgical excision procedure) of cervix complicating pregnancy   ? History of colposcopy   ? IBS (irritable bowel syndrome)   ? LGSIL on Pap smear of cervix   ? ? ?Past Surgical History:  ?Procedure Laterality Date  ? COLPOSCOPY    ? DILATION AND CURETTAGE OF UTERUS    ? INDUCED ABORTION    ? TONSILLECTOMY    ? ? ?Family History  ?Problem Relation Age of Onset  ? Heart attack Maternal Grandfather   ? Leukemia Maternal Grandfather    ? Depression Mother   ? Hyperlipidemia Mother   ? Depression Maternal Grandmother   ? Hypertension Paternal Grandmother   ? Melanoma Paternal Grandfather   ? ?Social History:  reports that she has quit smoking. Her smoking use included cigarettes. She started smoking about 17 years ago. She has a 3.50 pack-year smoking history. She has never used smokeless tobacco. She reports that she does not currently use alcohol. She reports that she does not use drugs. ? ?Allergies:  ?Allergies  ?Allergen Reactions  ? Penicillins Hives  ? ? ?Medications Prior to Admission  ?Medication Sig Dispense Refill  ? Calcium Carbonate (CALCIUM 500 PO) Take 500 mg by mouth daily.    ? cetirizine (ZYRTEC) 10 MG tablet Take 10 mg by mouth daily.    ? omeprazole (PRILOSEC) 20 MG capsule TAKE 1 CAPSULE BY MOUTH EVERY DAY 90 capsule 0  ? Prenatal Vit-Fe Fumarate-FA (PRENATAL MULTIVITAMIN) TABS tablet Take 1 tablet by mouth daily.    ? sertraline (ZOLOFT) 50 MG tablet TAKE 1 TABLET BY MOUTH EVERYDAY AT BEDTIME 90 tablet 1  ? VITAMIN D PO Take 1 tablet by mouth daily.    ? ? ?Results for orders  placed or performed during the hospital encounter of 12/04/21 (from the past 48 hour(s))  ?CBC     Status: Abnormal  ? Collection Time: 12/04/21 11:29 AM  ?Result Value Ref Range  ? WBC 9.8 4.0 - 10.5 K/uL  ? RBC 3.80 (L) 3.87 - 5.11 MIL/uL  ? Hemoglobin 10.9 (L) 12.0 - 15.0 g/dL  ? HCT 33.9 (L) 36.0 - 46.0 %  ? MCV 89.2 80.0 - 100.0 fL  ? MCH 28.7 26.0 - 34.0 pg  ? MCHC 32.2 30.0 - 36.0 g/dL  ? RDW 14.1 11.5 - 15.5 %  ? Platelets 159 150 - 400 K/uL  ? nRBC 0.0 0.0 - 0.2 %  ?  Comment: Performed at [facility], [address], [place], [place] [address]  ?RPR     Status: None  ? Collection Time: 12/04/21 11:29 AM  ?Result Value Ref Range  ? RPR Ser Ql NON REACTIVE NON REACTIVE  ?  Comment: Performed at [facility], [place] [address], [place], [place] [address]  ?Rapid HIV screen (HIV 1/2 Ab+Ag)     Status: None  ? Collection Time: 12/04/21  11:29 AM  ?Result Value Ref Range  ? HIV-1 P24 Antigen - HIV24 NON REACTIVE NON REACTIVE  ?  Comment: (NOTE) ?Detection of p24 may be inhibited by biotin in the sample, causing ?false negative results in acute infection. ?  ? HIV 1/2 Antibodies NON REACTIVE NON REACTIVE  ? Interpretation (HIV Ag Ab)    ?  A non reactive test result means that HIV 1 or HIV 2 antibodies and HIV 1 p24 antigen were not detected in the specimen.  ?  Comment: Performed at [facility], [address], [place], [place] [address]  ?Type and screen [place]     Status: None  ? Collection Time: 12/04/21 11:29 AM  ?Result Value Ref Range  ? ABO/RH(D) A POS   ? Antibody Screen NEG   ? Sample Expiration 12/07/2021,2359   ? Extend sample reason    ?  PREGNANT WITHIN 3 MONTHS, UNABLE TO EXTEND ?Performed at [facility], [address], [place], [place] [address] ?  ? ?No results found. ? ?Review of Systems  ?Constitutional:  Negative for chills and fever.  ?HENT:  Negative for congestion, hearing loss and sinus pain.   ?Respiratory:  Negative for cough, shortness of breath and wheezing.   ?Cardiovascular:  Negative for chest pain, palpitations and leg swelling.  ?Gastrointestinal:  Negative for abdominal pain, constipation, diarrhea, nausea and vomiting.  ?Genitourinary:  Negative for dysuria, flank pain, frequency, hematuria and urgency.  ?Musculoskeletal:  Negative for back pain.  ?Skin:  Negative for rash.  ?Neurological:  Negative for dizziness and headaches.  ?Psychiatric/Behavioral:  Negative for suicidal ideas. The patient is not nervous/anxious.   ? ?Blood pressure 130/73, pulse 72, temperature 98 ?F (36.7 ?C), temperature source Oral, resp. rate 18, height 5\' 5"  (1.651 m), weight 106.6 kg, last menstrual period 03/04/2021, SpO2 100 %. ?Physical Exam ?Vitals and nursing note reviewed.  ?Constitutional:   ?   Appearance: Normal appearance. She is well-developed.  ?HENT:  ?   Head: Normocephalic  and atraumatic.  ?Cardiovascular:  ?   Rate and Rhythm: Normal rate and regular rhythm.  ?Pulmonary:  ?   Effort: Pulmonary effort is normal.  ?   Breath sounds: Normal breath sounds.  ?Abdominal:  ?   General: Bowel sounds are normal.  ?   Palpations: Abdomen is  soft.  ?Musculoskeletal:     ?   General: Normal range of motion.  ?Skin: ?   General: Skin is warm and dry.  ?Neurological:  ?   Mental Status: She is alert and oriented to person, place, and time.  ?Psychiatric:     ?   Behavior: Behavior normal.     ?   Thought Content: Thought content normal.     ?   Judgment: Judgment normal.  ?  ? ?Assessment/Plan ?31 y.o. G4P0030 at [redacted]w[redacted]d with breech presentation who desires an elective primary cesarean section.  ? ?Discussed risks, benefits, and alternatives with the patient for the cesarean section.  Discussed the risk of infection bleeding and damage to surrounding pelvic tissues including but not limited to the uterus, fallopian tubes, ovaries, bowel, and bladder.  Discussed proper care for the incision postoperatively to avoid infection and signs and symptoms of infection to watch for.  Discussed possibility of an emergent blood transfusion for heavy blood loss.  Discussed risks associated with blood transfusion including transfusion reaction and chronic infections, or sepsis which could lead to death.  Patient gave consent for the procedure and blood transfusion if needed. Consent forms were completed. ? ? ?Homero Fellers, MD ?12/05/2021, 8:39 AM ? ? ? ? ?

## 2021-12-06 LAB — CBC
HCT: 30.8 % — ABNORMAL LOW (ref 36.0–46.0)
Hemoglobin: 9.8 g/dL — ABNORMAL LOW (ref 12.0–15.0)
MCH: 28.8 pg (ref 26.0–34.0)
MCHC: 31.8 g/dL (ref 30.0–36.0)
MCV: 90.6 fL (ref 80.0–100.0)
Platelets: 140 10*3/uL — ABNORMAL LOW (ref 150–400)
RBC: 3.4 MIL/uL — ABNORMAL LOW (ref 3.87–5.11)
RDW: 14.1 % (ref 11.5–15.5)
WBC: 10.7 10*3/uL — ABNORMAL HIGH (ref 4.0–10.5)
nRBC: 0 % (ref 0.0–0.2)

## 2021-12-06 MED ORDER — ACETAMINOPHEN 500 MG PO TABS
1000.0000 mg | ORAL_TABLET | Freq: Four times a day (QID) | ORAL | Status: DC
  Administered 2021-12-06 – 2021-12-07 (×4): 1000 mg via ORAL
  Filled 2021-12-06 (×4): qty 2

## 2021-12-06 NOTE — Progress Notes (Signed)
Subjective: ?Postpartum Day 1: Cesarean Delivery ?Patient reports tolerating PO, + flatus, and no problems voiding.  Her pain is well controlled with oral medication. ?She shares that her scheduled CS went smoothly. Her husband and family member are present and supportive. ? ?Objective: ?Vital signs in last 24 hours: ?Temp:  [97.6 ?F (36.4 ?C)-98.3 ?F (36.8 ?C)] 98.3 ?F (36.8 ?C) (04/19 8110) ?Pulse Rate:  [66-95] 70 (04/19 0728) ?Resp:  [18-20] 18 (04/19 0728) ?BP: (98-124)/(57-82) 116/79 (04/19 3159) ?SpO2:  [93 %-100 %] 97 % (04/19 0728) ? ?Physical Exam:  ?General: alert, cooperative, no distress, and moderately obese ?Lochia: appropriate ?Uterine Fundus: firm ?Incision: healing well, no significant drainage, honey comb dressing is securely in place. ?DVT Evaluation: No evidence of DVT seen on physical exam. ?Negative Homan's sign. ? ?Recent Labs  ?  12/04/21 ?1129 12/06/21 ?0518  ?HGB 10.9* 9.8*  ?HCT 33.9* 30.8*  ? ? ?Assessment/Plan: ?Status post Cesarean section. Doing well postoperatively.  ?Continue current care. ? ?Mirna Mires ?12/06/2021, 2:19 PM ? ? ?

## 2021-12-06 NOTE — TOC Initial Note (Signed)
Transition of Care (TOC) - Initial/Assessment Note  ? ? ?Patient Details  ?Name: April Harris ?MRN: [id number] ?Date of Birth: 1990/12/30 ? ?Transition of Care (TOC) CM/SW Contact:    ?Swansea Cellar, RN ?Phone Number: ?12/06/2021, 5:29 PM ? ?Clinical Narrative:                 ?LVMM for patient to call back. No answer to room phone. Will attempt again tomorrow.  ? ?  ?  ? ? ?Patient Goals and CMS Choice ?  ?  ?  ? ?Expected Discharge Plan and Services ?  ?  ?  ?  ?  ?                ?  ?  ?  ?  ?  ?  ?  ?  ?  ?  ? ?Prior Living Arrangements/Services ?  ?  ?  ?       ?  ?  ?  ?  ? ?Activities of Daily Living ?Home Assistive Devices/Equipment: None ?ADL Screening (condition at time of admission) ?Patient's cognitive ability adequate to safely complete daily activities?: Yes ?Is the patient deaf or have difficulty hearing?: No ?Does the patient have difficulty seeing, even when wearing glasses/contacts?: No ?Does the patient have difficulty concentrating, remembering, or making decisions?: No ?Patient able to express need for assistance with ADLs?: Yes ?Does the patient have difficulty dressing or bathing?: No ?Independently performs ADLs?: Yes (appropriate for developmental age) ?Does the patient have difficulty walking or climbing stairs?: No ?Weakness of Legs: None ?Weakness of Arms/Hands: None ? ?Permission Sought/Granted ?  ?  ?   ?   ?   ?   ? ?Emotional Assessment ?  ?  ?  ?  ?  ?  ? ?Admission diagnosis:  Breech presentation of fetus [O32.1XX0] ?Patient Active Problem List  ? Diagnosis Date Noted  ? Breech presentation of fetus 12/05/2021  ? Cesarean delivery delivered   ? Failed external cephalic version 95/18/8416  ? History of loop electrosurgical excision procedure (LEEP) 06/01/2021  ? Supervision of normal pregnancy 05/27/2021  ? Vitamin D insufficiency 08/11/2019  ? Chronic alcoholism in remission (HCC) 05/30/2019  ? Tobacco abuse counseling 05/30/2019  ? Family history of drug/alcohol addiction  05/30/2019  ? Family history of depression- in entire maternal lineage 05/30/2019  ? Family history of malignant melanoma- paternal grandfather 05/30/2019  ? Family history of hyperlipidemia-  mom 05/30/2019  ? Plantar warts 05/30/2019  ? Overweight (BMI 25.0-29.9) 05/30/2019  ? Spondylisthesis 05/27/2019  ? h/o Hypercholesterolemia 11/12/2017  ? Menorrhagia with regular cycle 11/07/2017  ? IBS (irritable bowel syndrome) 11/16/2009  ? Asthma 04/21/2009  ? ?PCP:  Mayer Masker, PA-C ?Pharmacy:   ?[id number], [place] - [address] ?[address] ?[place] [address] ?Phone: [phone] Fax: [phone] ? ? ? ? ?Social Determinants of Health (SDOH) Interventions ?  ? ?Readmission Risk Interventions ?   ? View : No data to display.  ?  ?  ?  ? ? ? ?

## 2021-12-06 NOTE — Anesthesia Post-op Follow-up Note (Signed)
?  Anesthesia Pain Follow-up Note ? ?Patient: April Harris ? ?Day #: 1 ? ?Date of Follow-up: 12/06/2021 Time: 9:04 AM ? ?Last Vitals:  ?Vitals:  ? 12/06/21 0500 12/06/21 0728  ?BP:  116/79  ?Pulse: 67 70  ?Resp:  18  ?Temp:  36.8 ?C  ?SpO2: 97% 97%  ? ? ?Level of Consciousness: alert ? ?Pain: none  ? ?Side Effects:None ? ?Catheter Site Exam:clean, dry, no drainage ? ? ? ? ?Plan: D/C from anesthesia care at surgeon's request ? ?Irving Burton M ? ? ? ?

## 2021-12-06 NOTE — Lactation Note (Signed)
This note was copied from a baby's chart. ?Lactation Consultation Note ? ?Patient Name: April Harris ?[id number]'[id number] Date: 12/06/2021 ?Reason for consult: Follow-up assessment;Primapara;Term;RN request;Other (Comment) (recessed chin; nipple shield sizing) ?Age:33 hours ? ?Lactation follow-up. ? ?Maternal Data ?Has patient been taught Hand Expression?: Yes ?Does the patient have breastfeeding experience prior to this delivery?: No ? ?Feeding ?Mother's Current Feeding Choice: Breast Milk ? ?Baby became more active with feeding overnight. Baby has recessed chin and with help of nipple shield had successful feedings with identified transfer by night RN. ?This morning mom is asking about sizing of the nipple shield to see if 20 would be more appropriate- this will be tried at next feeding. ? ?LATCH Score ?Latch: Repeated attempts needed to sustain latch, nipple held in mouth throughout feeding, stimulation needed to elicit sucking reflex. ? ?Audible Swallowing: Spontaneous and intermittent ? ?Type of Nipple: Everted at rest and after stimulation ? ?Comfort (Breast/Nipple): Soft / non-tender ? ?Hold (Positioning): Assistance needed to correctly position infant at breast and maintain latch. ? ?LATCH Score: 8 ? ? ?Lactation Tools Discussed/Used ?Tools: Nipple Dorris Carnes ?Nipple shield size: 20;24 (previously given a 24, will try 20 to see if fit/feeling is better) ? ?Interventions ?Interventions: Breast feeding basics reviewed;Hand express;Education (nipple shield sizing) ? ?Reviewed newborn basics: feeding pattern/behaviors, early cues, output expectations, frequent attempts, and skin to skin. ? ?Discharge ?  ? ?Consult Status ?Consult Status: Follow-up ?Date: 12/06/21 ?Follow-up type: In-patient ? ? ? ?Danford Bad ?12/06/2021, 11:20 AM ? ? ? ?

## 2021-12-06 NOTE — Lactation Note (Signed)
This note was copied from a baby's chart. ?Lactation Consultation Note ? ?Patient Name: Girl Maddyson Dossantos ?[id number] Date: 12/06/2021 ?Reason for consult: Follow-up assessment ?Age:33 hours ? ?Maternal Data ?Has patient been taught Hand Expression?: Yes ?Does the patient have breastfeeding experience prior to this delivery?: No ? ?Feeding ?Mother's Current Feeding Choice: Breast Milk ? ?LATCH Score ?  ? ?Lactation Tools Discussed/Used ?Tools: Pump ?Breast pump type: Manual ?Pump Education: Setup, frequency, and cleaning;Milk Storage ?Reason for Pumping: occasional milk removal (mom leaking on opposite breast when feeding baby; potential for large supply/fullness relief) ?Pumping frequency: as needed ? ?Hand pump given. Baby has been feeding well but mom has noticed leaking on opposite breast while feeding. Hand pump given to provide fullness relief as needed.  ? ?Interventions ?Interventions: Hand pump (use of hand pump as needed for milk removal) ? ?Discharge ?Pump: Personal ? ?Consult Status ?Consult Status: Follow-up ?Date: 12/07/21 ?Follow-up type: In-patient ? ? ? ?Lavonia Drafts ?12/06/2021, 5:42 PM ? ? ? ?

## 2021-12-06 NOTE — Anesthesia Postprocedure Evaluation (Signed)
Anesthesia Post Note ? ?Patient: April Harris ? ?Procedure(s) Performed: CESAREAN SECTION ? ?Patient location during evaluation: Mother Baby ?Anesthesia Type: Spinal ?Level of consciousness: oriented and awake and alert ?Pain management: pain level controlled ?Vital Signs Assessment: post-procedure vital signs reviewed and stable ?Respiratory status: spontaneous breathing and respiratory function stable ?Cardiovascular status: blood pressure returned to baseline and stable ?Postop Assessment: no headache, no backache, no apparent nausea or vomiting and able to ambulate ?Anesthetic complications: no ? ? ?No notable events documented. ? ? ?Last Vitals:  ?Vitals:  ? 12/06/21 0500 12/06/21 0728  ?BP:  116/79  ?Pulse: 67 70  ?Resp:  18  ?Temp:  36.8 ?C  ?SpO2: 97% 97%  ?  ?Last Pain:  ?Vitals:  ? 12/06/21 0750  ?TempSrc:   ?PainSc: 0-No pain  ? ? ?  ?  ?  ?  ?  ?  ? ?Irving Burton M ? ? ? ? ?

## 2021-12-07 DIAGNOSIS — Z0000 Encounter for general adult medical examination without abnormal findings: Secondary | ICD-10-CM

## 2021-12-07 MED ORDER — OXYCODONE HCL 5 MG PO TABS
5.0000 mg | ORAL_TABLET | Freq: Four times a day (QID) | ORAL | 0 refills | Status: AC | PRN
Start: 2021-12-07 — End: 2021-12-12

## 2021-12-07 NOTE — TOC Initial Note (Addendum)
Transition of Care (TOC) - Initial/Assessment Note  ? ? ?Patient Details  ?Name: April Harris ?MRN: [id number] ?Date of Birth: 1990/12/23 ? ?Transition of Care (TOC) CM/SW Contact:    ?[place] Cellar, RN ?Phone Number: ?12/07/2021, 12:50 PM ? ?Clinical Narrative:                 ?Spoke to patient to discuss any needs or concerns. Patient reports nursing staff and MD have been by and she is eager for discharge. No current needs or concerns. Patient plans to breastfeed and has all needed equipment and transportation. Follow up appointments made for infant and mother. Support system in place and understanding of PPD and resources if needed.  ? ?  ?  ? ? ?Patient Goals and CMS Choice ?  ?  ?  ? ?Expected Discharge Plan and Services ?  ?  ?  ?  ?  ?Expected Discharge Date: 12/07/21               ?  ?  ?  ?  ?  ?  ?  ?  ?  ?  ? ?Prior Living Arrangements/Services ?  ?  ?  ?       ?  ?  ?  ?  ? ?Activities of Daily Living ?Home Assistive Devices/Equipment: None ?ADL Screening (condition at time of admission) ?Patient's cognitive ability adequate to safely complete daily activities?: Yes ?Is the patient deaf or have difficulty hearing?: No ?Does the patient have difficulty seeing, even when wearing glasses/contacts?: No ?Does the patient have difficulty concentrating, remembering, or making decisions?: No ?Patient able to express need for assistance with ADLs?: Yes ?Does the patient have difficulty dressing or bathing?: No ?Independently performs ADLs?: Yes (appropriate for developmental age) ?Does the patient have difficulty walking or climbing stairs?: No ?Weakness of Legs: None ?Weakness of Arms/Hands: None ? ?Permission Sought/Granted ?  ?  ?   ?   ?   ?   ? ?Emotional Assessment ?  ?  ?  ?  ?  ?  ? ?Admission diagnosis:  Breech presentation of fetus [O32.1XX0] ?Patient Active Problem List  ? Diagnosis Date Noted  ? Encounter for care or examination of lactating mother 12/07/2021  ? Postpartum care following cesarean  delivery 12/07/2021  ? Breech presentation of fetus 12/05/2021  ? Cesarean delivery delivered   ? Failed external cephalic version 66/44/0347  ? History of loop electrosurgical excision procedure (LEEP) 06/01/2021  ? Supervision of normal pregnancy 05/27/2021  ? Vitamin D insufficiency 08/11/2019  ? Chronic alcoholism in remission (HCC) 05/30/2019  ? Tobacco abuse counseling 05/30/2019  ? Family history of drug/alcohol addiction 05/30/2019  ? Family history of depression- in entire maternal lineage 05/30/2019  ? Family history of malignant melanoma- paternal grandfather 05/30/2019  ? Family history of hyperlipidemia-  mom 05/30/2019  ? Plantar warts 05/30/2019  ? Overweight (BMI 25.0-29.9) 05/30/2019  ? Spondylisthesis 05/27/2019  ? h/o Hypercholesterolemia 11/12/2017  ? Menorrhagia with regular cycle 11/07/2017  ? IBS (irritable bowel syndrome) 11/16/2009  ? Asthma 04/21/2009  ? ?PCP:  Mayer Masker, PA-C ?Pharmacy:   ?[id number], [place] - [address] ?[address] ? [place] [address] ?Phone: [phone] Fax: [phone] ? ? ? ? ?Social Determinants of Health (SDOH) Interventions ?  ? ?Readmission Risk Interventions ?   ? View : No data to display.  ?  ?  ?  ? ? ? ?

## 2021-12-07 NOTE — Discharge Summary (Addendum)
?OB Discharge Summary  ?   ?Patient Name: April Harris ?DOB: April 24, 1991 ?MRN: [id number] ? ?Date of admission: 12/05/2021 ?Delivering MD: Onalee Hua ?Date of Delivery: 12/05/2021  ?Date of discharge: 12/07/2021 ? ?Admitting diagnosis: Breech presentation of fetus [O32.1XX0] ?Intrauterine pregnancy: [redacted]w[redacted]d     ?Secondary diagnosis: Gestational Diabetes diet controlled (A1) ?    ?Discharge diagnosis: Term Pregnancy Delivered                                  ?                                                              ?Post partum procedures: none ? ?Augmentation: N/A ? ?Complications: None ? ?Hospital course:  Sceduled C/S   31 y.o. yo [phone] at [redacted]w[redacted]d was admitted to the hospital 12/05/2021 for scheduled cesarean section with the following indication:Malpresentation.Delivery details are as follows:  ?Membrane Rupture Time/Date: 10:39 AM ,12/05/2021   ?Delivery Method:C-Section, Low Transverse  ?Details of operation can be found in separate operative note.  Patient had an uncomplicated postpartum course.  She is ambulating, tolerating a regular diet, passing flatus, and urinating well. Patient is discharged home in stable condition on  12/07/21 ?       ?Newborn Data: ?Birth date:12/05/2021  ?Birth time:10:37 AM  ?Gender:Female  April Harris ?Living status:Living  ?Apgars:8 ,9  ?Weight:3650 g    ? ?Physical exam  ?Vitals:  ? 12/06/21 0728 12/06/21 1542 12/06/21 2335 12/07/21 0845  ?BP: 116/79 122/75 (!) 126/91 (!) 125/94  ?Pulse: 70 71 73 71  ?Resp: 18 20 18 20   ?Temp: 98.3 ?F (36.8 ?C) 98.3 ?F (36.8 ?C) 98.4 ?F (36.9 ?C) 97.9 ?F (36.6 ?C)  ?TempSrc: Oral Oral Oral Oral  ?SpO2: 97%  96% 100%  ?Weight:      ?Height:      ? ?General: alert, cooperative, and no distress ?Lochia: appropriate ?Uterine Fundus: firm ?Incision: Healing well with no significant drainage, dressing C/D/I, On Q intact ?DVT Evaluation: No evidence of DVT seen on physical exam. ? ?Labs: ?Lab Results  ?Component Value Date  ? WBC 10.7 (H) 12/06/2021  ? HGB  9.8 (L) 12/06/2021  ? HCT 30.8 (L) 12/06/2021  ? MCV 90.6 12/06/2021  ? PLT 140 (L) 12/06/2021  ? ? ?Discharge instruction: per After Visit Summary. ? ?Medications:  ?Allergies as of 12/07/2021   ? ?   Reactions  ? Penicillins Hives  ? ?  ? ?  ?Medication List  ?  ? ?STOP taking these medications   ? ?CALCIUM 500 PO ?  ?omeprazole 20 MG capsule ?Commonly known as: PRILOSEC ?  ? ?  ? ?TAKE these medications   ? ?cetirizine 10 MG tablet ?Commonly known as: ZYRTEC ?Take 10 mg by mouth daily. ?  ?oxyCODONE 5 MG immediate release tablet ?Commonly known as: Oxy IR/ROXICODONE ?Take 1 tablet (5 mg total) by mouth every 6 (six) hours as needed for up to 5 days for severe pain. ?  ?prenatal multivitamin Tabs tablet ?Take 1 tablet by mouth daily. ?  ?sertraline 50 MG tablet ?Commonly known as: ZOLOFT ?TAKE 1 TABLET BY MOUTH EVERYDAY AT BEDTIME ?  ?VITAMIN D PO ?Take 1 tablet by mouth daily. ?  ? ?  ? ?  ?  ? ? ?  ?  Discharge Care Instructions  ?(From admission, onward)  ?  ? ? ?  ? ?  Start     Ordered  ? 12/07/21 0000  Discharge wound care:       ?Comments: Keep incision dry, clean.  ? 12/07/21 1022  ? ?  ?  ? ?  ? ? ?Diet: routine diet ? ?Activity: Advance as tolerated. Pelvic rest for 6 weeks.  ? ?Outpatient follow up: ? Follow-up Information   ? ? [facility]. Go in 1 week(s).   ?Why: For incision check ?Contact information: ?[address] ?[place] [address] ?[phone] ? ?  ?  ? ?  ?  ? ?  ?   ? ?Postpartum contraception: Undecided ?Rhogam Given postpartum: Rh positive ?Rubella vaccine given postpartum: immune ?Varicella vaccine given postpartum: immune ?TDaP given antepartum or postpartum: last administered 2019 ? ? ?Newborn Delivery   ?Birth date/time: 12/05/2021 10:37:00 ?Delivery type: C-Section, Low Transverse ?Trial of labor: No ?C-section categorization: Primary ?  ?  ? ? ?Baby Feeding: Breast ? ?Disposition:home with mother ? ?SIGNED: ? ?Tresea Mall, CNM ?12/07/2021 10:23 AM    ?

## 2021-12-11 ENCOUNTER — Ambulatory Visit (INDEPENDENT_AMBULATORY_CARE_PROVIDER_SITE_OTHER): Payer: PRIVATE HEALTH INSURANCE | Admitting: Obstetrics and Gynecology

## 2021-12-11 ENCOUNTER — Encounter: Payer: Self-pay | Admitting: Obstetrics and Gynecology

## 2021-12-11 VITALS — BP 122/70 | Ht 65.0 in | Wt 226.0 lb

## 2021-12-11 DIAGNOSIS — Z4889 Encounter for other specified surgical aftercare: Secondary | ICD-10-CM

## 2021-12-11 NOTE — Progress Notes (Signed)
31 yo P1031 s/p primary cesarean section on 12/05/2021 at 39 weeks secondary to breech presentation. Patient reports feeling well, reporting some abdominal soreness. She states her pain is well controlled with ibuprofen. She denies drainage from her incision or fever. She is breastfeeding. She receives ample support from her partner. She denies signs or symptoms of postpartum depression ? ?Past Medical History:  ?Diagnosis Date  ? Anxiety   ? Asthma   ? Depression   ? GERD (gastroesophageal reflux disease)   ? H/O LEEP (loop electrosurgical excision procedure) of cervix complicating pregnancy   ? History of colposcopy   ? IBS (irritable bowel syndrome)   ? LGSIL on Pap smear of cervix   ? ?Past Surgical History:  ?Procedure Laterality Date  ? CESAREAN SECTION N/A 12/05/2021  ? Procedure: CESAREAN SECTION;  Surgeon: Homero Fellers, MD;  Location: [facility] ORS;  Service: Obstetrics;  Laterality: N/A;  ? COLPOSCOPY    ? DILATION AND CURETTAGE OF UTERUS    ? INDUCED ABORTION    ? TONSILLECTOMY    ? ?Family History  ?Problem Relation Age of Onset  ? Heart attack Maternal Grandfather   ? Leukemia Maternal Grandfather   ? Depression Mother   ? Hyperlipidemia Mother   ? Depression Maternal Grandmother   ? Hypertension Paternal Grandmother   ? Melanoma Paternal Grandfather   ? ?Social History  ? ?Tobacco Use  ? Smoking status: Former  ?  Packs/day: 0.25  ?  Years: 14.00  ?  Pack years: 3.50  ?  Types: Cigarettes  ?  Start date: 08/20/2004  ? Smokeless tobacco: Never  ?Vaping Use  ? Vaping Use: Never used  ?Substance Use Topics  ? Alcohol use: Not Currently  ? Drug use: No  ? ?ROS ?See pertinent in HPI. All other systems reviewed and non contributory ? ?Blood pressure 122/70, height 5\' 5"  (1.651 m), weight 226 lb (102.5 kg), last menstrual period 03/04/2021, not currently breastfeeding. ?GENERAL: Well-developed, well-nourished female in no acute distress.  ?ABDOMEN: Soft, nontender, nondistended. No organomegaly. ?Incision: No  erythema, induration or drainage ?EXTREMITIES: No cyanosis, clubbing, or edema, 2+ distal pulses. ? ?A/P 31 yo 1 week s/p cesarean section  ?- Incision is healing well ?- Wound care instructions reviewed with the patient ?- Follow up in 4-5 weeks for post partum visit ? ?

## 2022-01-10 ENCOUNTER — Ambulatory Visit (INDEPENDENT_AMBULATORY_CARE_PROVIDER_SITE_OTHER): Payer: PRIVATE HEALTH INSURANCE | Admitting: Obstetrics and Gynecology

## 2022-01-10 ENCOUNTER — Encounter: Payer: Self-pay | Admitting: Obstetrics and Gynecology

## 2022-01-10 MED ORDER — NORETHINDRONE 0.35 MG PO TABS: 1.0000 | ORAL_TABLET | Freq: Every day | ORAL | 11 refills | Status: DC

## 2022-01-10 NOTE — Progress Notes (Signed)
Post Partum Visit Note  April Harris is a 31 y.o. [phone] female who presents for a postpartum visit. She is 6 weeks postpartum following a primary cesarean section.  I have fully reviewed the prenatal and intrapartum course. The delivery was at 39 gestational weeks.  Anesthesia: spinal. Postpartum course has been uncomplicated. Baby is doing well. Baby is feeding by breast. Bleeding no bleeding. Bowel function is normal. Bladder function is normal. Patient is not sexually active. Contraception method is abstinence. Postpartum depression screening: negative.   Upstream - 01/10/22 1033       Pregnancy Intention Screening   Does the patient want to become pregnant in the next year? No    Does the patient's partner want to become pregnant in the next year? No    Would the patient like to discuss contraceptive options today? Yes      Contraception Wrap Up   Current Method Oral Contraceptive    End Method Oral Contraceptive    Contraception Counseling Provided Yes            The pregnancy intention screening data noted above was reviewed. Potential methods of contraception were discussed. The patient elected to proceed with Oral Contraceptive.   Edinburgh Postnatal Depression Scale - 01/10/22 1034       Edinburgh Postnatal Depression Scale:  In the Past 7 Days   I have been able to laugh and see the funny side of things. 0    I have looked forward with enjoyment to things. 0    I have blamed myself unnecessarily when things went wrong. 0    I have been anxious or worried for no good reason. 0    I have felt scared or panicky for no good reason. 0    Things have been getting on top of me. 0    I have been so unhappy that I have had difficulty sleeping. 0    I have felt sad or miserable. 0    I have been so unhappy that I have been crying. 0    The thought of harming myself has occurred to me. 0    Edinburgh Postnatal Depression Scale Total 0             Health  Maintenance Due  Topic Date Due   COVID-19 Vaccine (4 - Booster for Pfizer series) 10/12/2020       Review of Systems Pertinent items noted in HPI and remainder of comprehensive ROS otherwise negative.  Objective:  BP 118/84   Ht 5\' 5"  (1.651 m)   Wt 212 lb 9.6 oz (96.4 kg)   LMP 03/04/2021 (Exact Date)   Breastfeeding Yes   BMI 35.38 kg/m    General:  alert, cooperative, and no distress   Breasts:  normal  Lungs: clear to auscultation bilaterally  Heart:  regular rate and rhythm  Abdomen: soft, non-tender; bowel sounds normal; no masses,  no organomegaly   Wound well approximated incision  GU exam:  not indicated       Assessment:    1. Routine postpartum follow-up Normal postpartum exam.   Plan:   Essential components of care per ACOG recommendations:  1.  Mood and well being: Patient with negative depression screening today. Reviewed local resources for support.  - Patient tobacco use? No.   - hx of drug use? No.    2. Infant care and feeding:  -Patient currently breastmilk feeding? Yes. Reviewed importance of draining breast regularly to support  lactation.  -Social determinants of health (SDOH) reviewed in EPIC. No concerns  3. Sexuality, contraception and birth spacing - Patient does not want a pregnancy in the next year.  Desired family size is 2 children.  - Reviewed reproductive life planning. Reviewed contraceptive methods based on pt preferences and effectiveness.  Patient desired Oral Contraceptive today.  Rx Lyza provided - Discussed birth spacing of 18 months  4. Sleep and fatigue -Encouraged family/partner/community support of 4 hrs of uninterrupted sleep to help with mood and fatigue  5. Physical Recovery  - Discussed patients delivery and complications. She describes her labor as good. - Patient had a C-section.   - Patient has urinary incontinence? No. - Patient is safe to resume physical and sexual activity  6.  Health Maintenance - HM due  items addressed Yes - Last pap smear  Diagnosis  Date Value Ref Range Status  05/25/2021   Final   - Negative for intraepithelial lesion or malignancy (NILM)   Pap smear not done at today's visit.  -Breast Cancer screening indicated? No.   7. Chronic Disease/Pregnancy Condition follow up: None  - PCP follow up  Catalina Antigua, MD [facility] for [facility], [facility]

## 2022-01-17 ENCOUNTER — Ambulatory Visit: Payer: PRIVATE HEALTH INSURANCE | Admitting: Obstetrics and Gynecology

## 2022-01-23 ENCOUNTER — Other Ambulatory Visit: Payer: Self-pay | Admitting: Physician Assistant

## 2022-01-23 DIAGNOSIS — K219 Gastro-esophageal reflux disease without esophagitis: Secondary | ICD-10-CM

## 2022-01-28 ENCOUNTER — Other Ambulatory Visit: Payer: Self-pay | Admitting: Physician Assistant

## 2022-01-28 DIAGNOSIS — F418 Other specified anxiety disorders: Secondary | ICD-10-CM

## 2022-02-03 ENCOUNTER — Other Ambulatory Visit: Payer: Self-pay | Admitting: Physician Assistant

## 2022-02-03 DIAGNOSIS — K219 Gastro-esophageal reflux disease without esophagitis: Secondary | ICD-10-CM

## 2022-02-13 ENCOUNTER — Encounter: Payer: Self-pay | Admitting: Physician Assistant

## 2022-02-13 DIAGNOSIS — K219 Gastro-esophageal reflux disease without esophagitis: Secondary | ICD-10-CM

## 2022-02-13 MED ORDER — OMEPRAZOLE 20 MG PO CPDR: 20.0000 mg | DELAYED_RELEASE_CAPSULE | Freq: Every day | ORAL | 0 refills | Status: DC

## 2022-03-11 IMAGING — US US OB COMP +14 WK
1 series · 13 of 28 positions shown · non-contrast
Comparison: none

CLINICAL DATA: Second trimester pregnancy for fetal anatomy survey.

EXAM:
OBSTETRICAL ULTRASOUND >14 WKS

[Series 1: us ob comp + 14 wk · 13 of 105 slices shown]
[im 4/105]
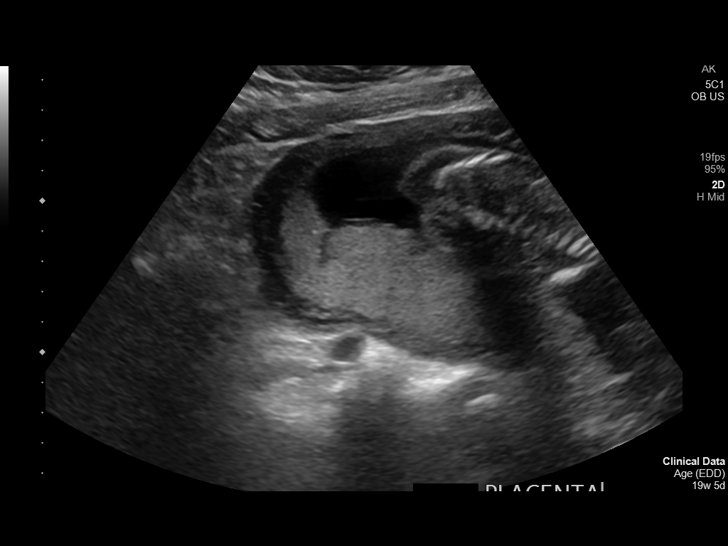
[im 12/105]
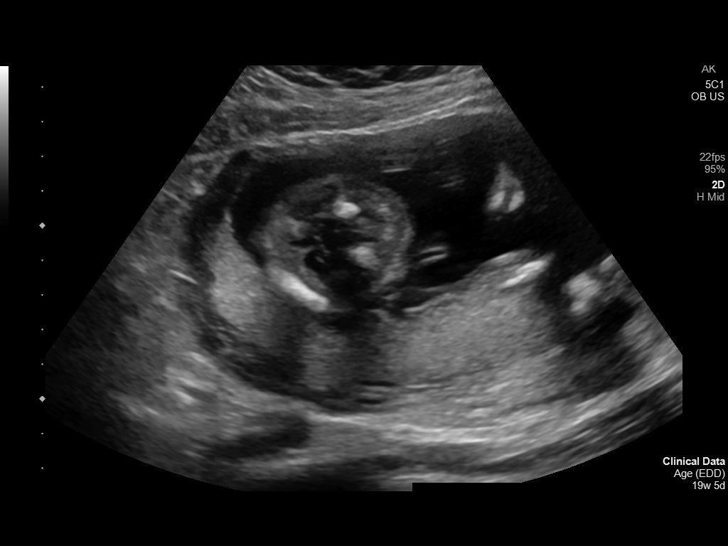
[im 20/105]
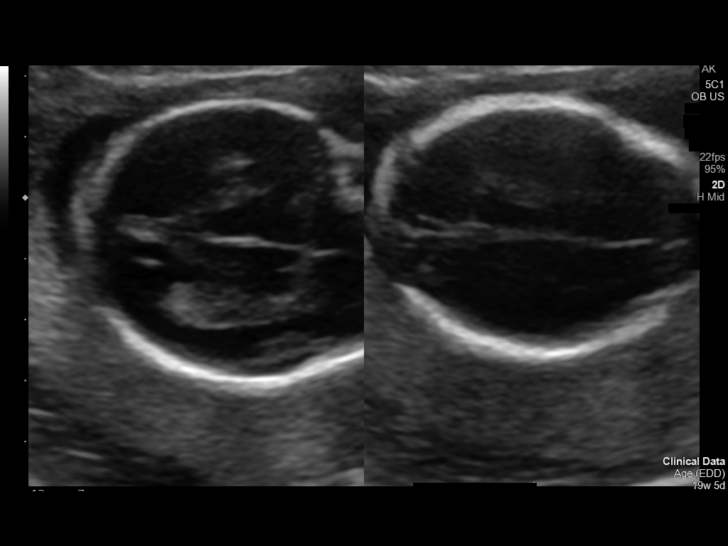
[im 27/105]
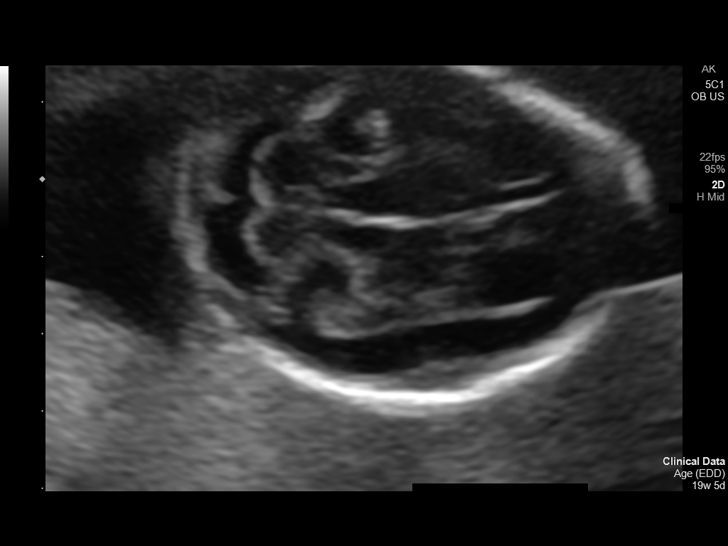
[im 35/105]
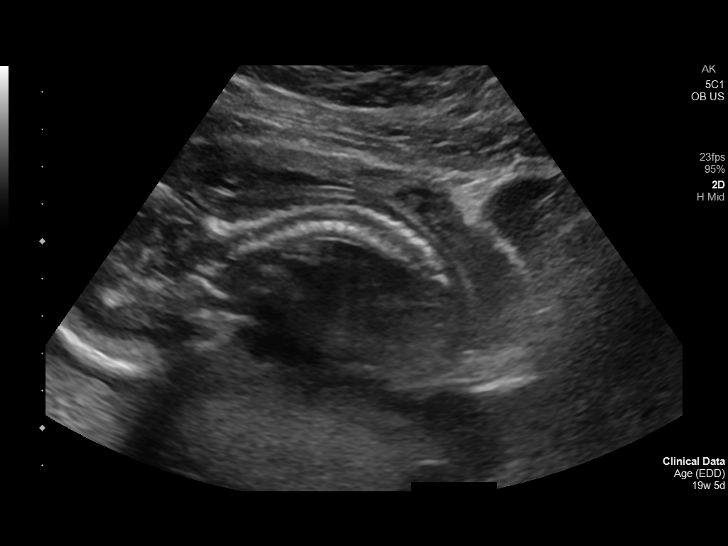
[im 43/105]
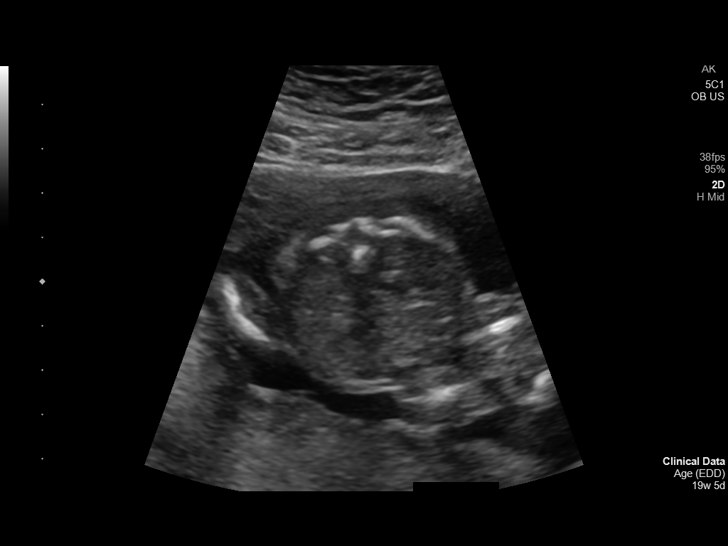
[im 54/105]
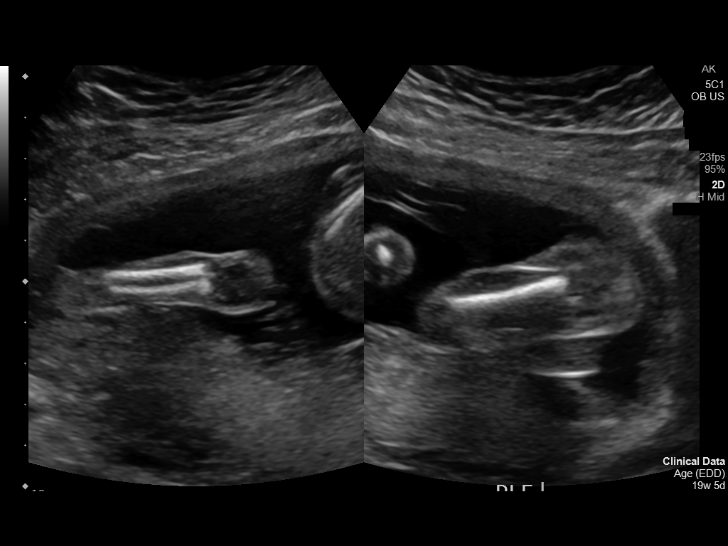
[im 62/105]
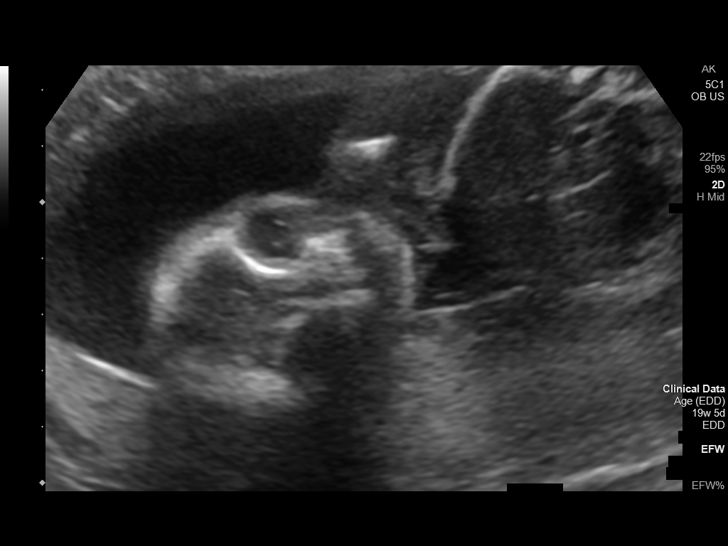
[im 70/105]
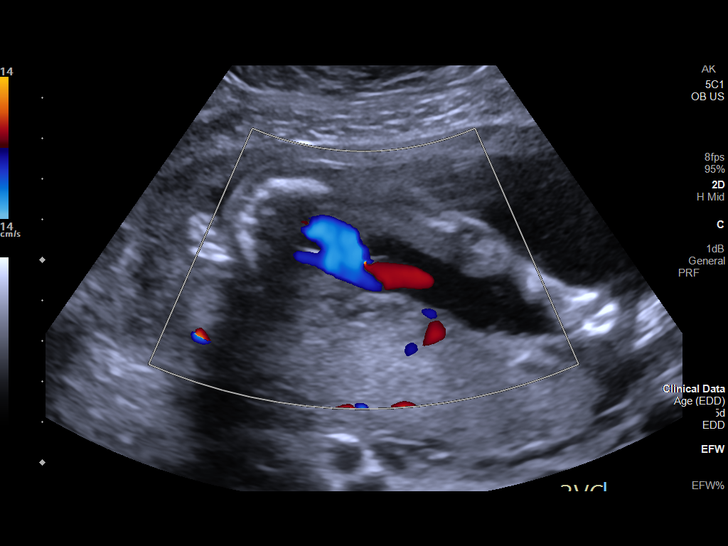
[im 78/105]
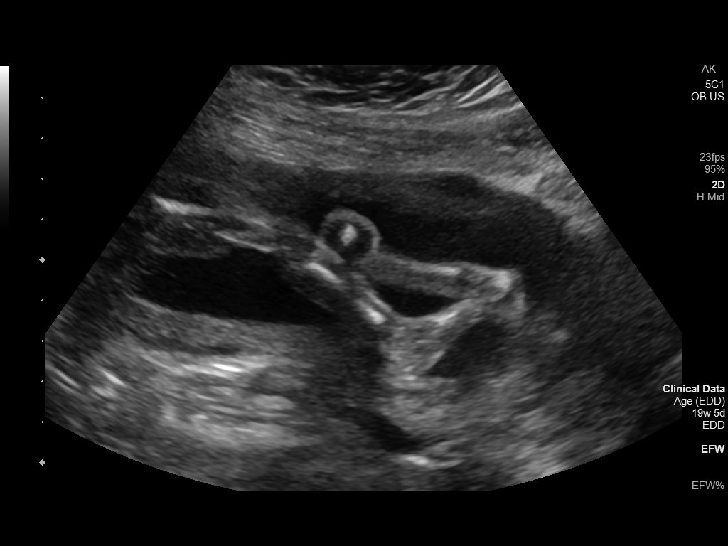
[im 85/105]
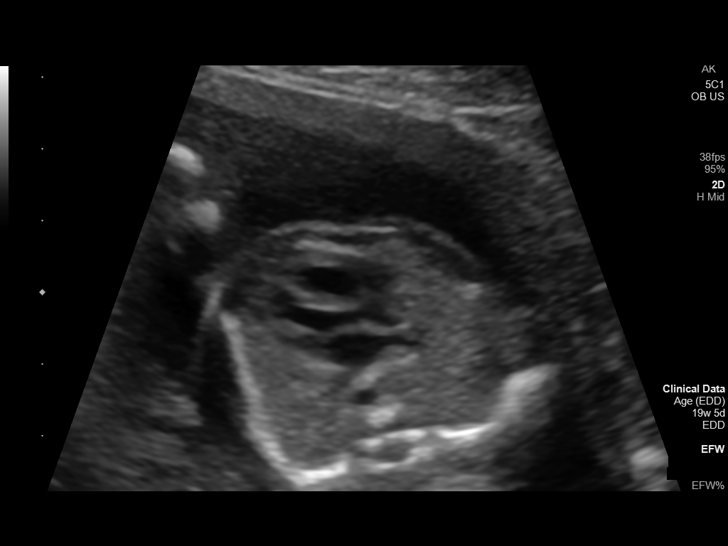
[im 93/105]
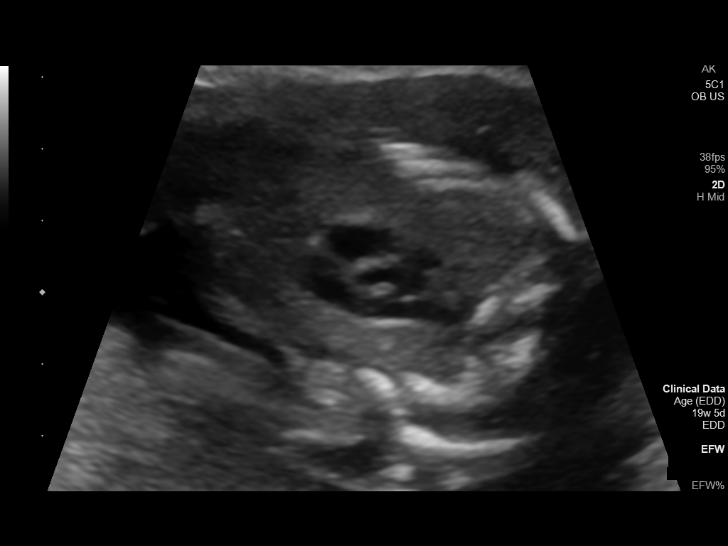
[im 101/105]
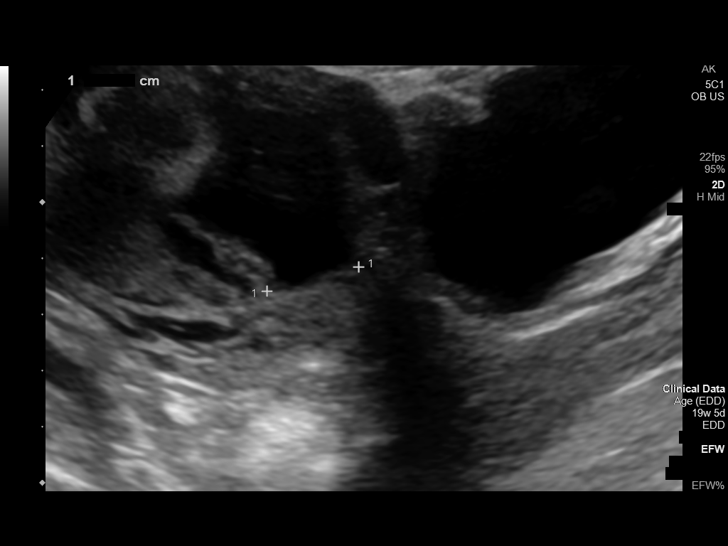

[13 of 28 positions shown; findings below may reference images not displayed]

FINDINGS: Number of Fetuses: 1

Heart Rate:  140 bpm

Movement: Yes

Presentation: Breech

Previa: Marginal

Placental Location: Posterior

Amniotic Fluid (Subjective): Within normal limits

Amniotic Fluid (Objective):

Vertical pocket = 4.2cm

FETAL BIOMETRY

BPD: 4.5cm 19w 4d

HC:   16.8cm 19w 3d

AC:   14.9cm 20w 1d

FL:   3.0cm 19w 3d

Current Mean GA: 19w 4d US EDC: 12/10/2021

FETAL ANATOMY

Lateral Ventricles: Appears normal

Thalami/CSP: Appears normal

Posterior Fossa:  Appears normal

Nuchal Region: Appears normal   NFT= 2.9 mm

Upper Lip: Appears normal

Spine: Appears normal

4 Chamber Heart on Left: Appears normal

LVOT: Appears normal

RVOT: Appears normal

Stomach on Left: Appears normal

3 Vessel Cord: Appears normal

Cord Insertion site: Appears normal

Kidneys: Appears normal

Bladder: Appears normal

Extremities: Appears normal

Maternal Findings:

Cervix:  3.4 cm TA
IMPRESSION: Single living IUP with estimated gestational age of 19 weeks 4 days,
and US EDC of 12/10/2021.

Unremarkable fetal anatomic survey.  No fetal anomalies identified.

Marginal placenta previa. Recommend continued follow-up by
ultrasound in 6-8 weeks.

## 2022-05-12 ENCOUNTER — Other Ambulatory Visit: Payer: Self-pay | Admitting: Physician Assistant

## 2022-05-12 DIAGNOSIS — K219 Gastro-esophageal reflux disease without esophagitis: Secondary | ICD-10-CM

## 2022-05-24 ENCOUNTER — Encounter: Payer: Self-pay | Admitting: Physician Assistant

## 2022-05-24 ENCOUNTER — Other Ambulatory Visit: Payer: Self-pay | Admitting: Physician Assistant

## 2022-05-24 DIAGNOSIS — K219 Gastro-esophageal reflux disease without esophagitis: Secondary | ICD-10-CM

## 2022-06-04 ENCOUNTER — Ambulatory Visit
Admission: EM | Admit: 2022-06-04 | Discharge: 2022-06-04 | Disposition: A | Discharge status: Home / Self Care | Payer: No Typology Code available for payment source | Attending: Internal Medicine | Admitting: Internal Medicine

## 2022-06-04 ENCOUNTER — Ambulatory Visit (INDEPENDENT_AMBULATORY_CARE_PROVIDER_SITE_OTHER): Payer: No Typology Code available for payment source

## 2022-06-04 DIAGNOSIS — M79671 Pain in right foot: Secondary | ICD-10-CM

## 2022-06-04 DIAGNOSIS — S89391A Other physeal fracture of lower end of right fibula, initial encounter for closed fracture: Secondary | ICD-10-CM

## 2022-06-04 NOTE — ED Triage Notes (Signed)
Pt tripped this morning while walking dog.  Heard pop, some swelling in R ankle.  Unable to bear weight.

## 2022-06-04 NOTE — ED Provider Notes (Signed)
[facility]    CSN: [id number] Arrival date & time: 06/04/22  1055      History   Chief Complaint Chief Complaint  Patient presents with   Ankle Pain    R    HPI April Harris is a 31 y.o. female.   Patient presents with right ankle pain started after a fall that occurred early this morning.  Patient reports that she was walking her dog when she tripped twisting her right ankle.  Patient reports majority of pain is in right ankle but does radiate down to foot at times.  Denies any numbness or tingling.  She reports a large amount of difficulty with bearing weight.  She has not taken any medications for pain.  Denies hitting head or losing consciousness.   Ankle Pain   Past Medical History:  Diagnosis Date   Anxiety    Asthma    Depression    GERD (gastroesophageal reflux disease)    H/O LEEP (loop electrosurgical excision procedure) of cervix complicating pregnancy    History of colposcopy    IBS (irritable bowel syndrome)    LGSIL on Pap smear of cervix     Patient Active Problem List   Diagnosis Date Noted   Encounter for care or examination of lactating mother 12/07/2021   Postpartum care following cesarean delivery 12/07/2021   Breech presentation of fetus 12/05/2021   Cesarean delivery delivered    Failed external cephalic version 63/08/6008   History of loop electrosurgical excision procedure (LEEP) 06/01/2021   Supervision of normal pregnancy 05/27/2021   Vitamin D insufficiency 08/11/2019   Chronic alcoholism in remission (HCC) 05/30/2019   Tobacco abuse counseling 05/30/2019   Family history of drug/alcohol addiction 05/30/2019   Family history of depression- in entire maternal lineage 05/30/2019   Family history of malignant melanoma- paternal grandfather 05/30/2019   Family history of hyperlipidemia-  mom 05/30/2019   Plantar warts 05/30/2019   Overweight (BMI 25.0-29.9) 05/30/2019   Spondylisthesis 05/27/2019   h/o  Hypercholesterolemia 11/12/2017   Menorrhagia with regular cycle 11/07/2017   IBS (irritable bowel syndrome) 11/16/2009   Asthma 04/21/2009    Past Surgical History:  Procedure Laterality Date   CESAREAN SECTION N/A 12/05/2021   Procedure: CESAREAN SECTION;  Surgeon: Natale Milch, MD;  Location: [facility] ORS;  Service: Obstetrics;  Laterality: N/A;   COLPOSCOPY     DILATION AND CURETTAGE OF UTERUS     INDUCED ABORTION     TONSILLECTOMY      OB History     Gravida  4   Para  1   Term  1   Preterm      AB  3   Living  1      SAB  2   IAB  1   Ectopic      Multiple  0   Live Births  1        Obstetric Comments  Cervical LEEP 06/20/2020          Home Medications    Prior to Admission medications   Medication Sig Start Date End Date Taking? Authorizing Provider  cetirizine (ZYRTEC) 10 MG tablet Take 10 mg by mouth daily.   Yes [provider]  norethindrone (MICRONOR) 0.35 MG tablet Take 1 tablet (0.35 mg total) by mouth daily. 01/10/22  Yes Constant, Peggy, MD  omeprazole (PRILOSEC) 20 MG capsule TAKE 1 CAPSULE BY MOUTH EVERY DAY 05/14/22  Yes Mayer Masker, PA-C  Prenatal Vit-Fe Fumarate-FA (  PRENATAL MULTIVITAMIN) TABS tablet Take 1 tablet by mouth daily.   Yes [provider]  sertraline (ZOLOFT) 50 MG tablet TAKE 1 TABLET BY MOUTH EVERYDAY AT BEDTIME 01/29/22  Yes Abonza, Maritza, PA-C  VITAMIN D PO Take 1 tablet by mouth daily.   Yes [provider]    Family History Family History  Problem Relation Age of Onset   Heart attack Maternal Grandfather    Leukemia Maternal Grandfather    Depression Mother    Hyperlipidemia Mother    Depression Maternal Grandmother    Hypertension Paternal Grandmother    Melanoma Paternal Grandfather     Social History Social History   Tobacco Use   Smoking status: Former    Packs/day: 0.25    Years: 14.00    Total pack years: 3.50    Types: Cigarettes    Start date: 08/20/2004    Smokeless tobacco: Never  Vaping Use   Vaping Use: Never used  Substance Use Topics   Alcohol use: Not Currently   Drug use: No     Allergies   Penicillins   Review of Systems Review of Systems Per HPI  Physical Exam Triage Vital Signs ED Triage Vitals  Enc Vitals Group     BP 06/04/22 1141 105/69     Pulse Rate 06/04/22 1141 78     Resp 06/04/22 1141 18     Temp 06/04/22 1141 98.6 F (37 C)     Temp Source 06/04/22 1141 Oral     SpO2 06/04/22 1141 97 %     Weight --      Height --      Head Circumference --      Peak Flow --      Pain Score 06/04/22 1137 6     Pain Loc --      Pain Edu? --      Excl. in GC? --    No data found.  Updated Vital Signs BP 105/69 (BP Location: Left Arm)   Pulse 78   Temp 98.6 F (37 C) (Oral)   Resp 18   SpO2 97%   Breastfeeding Yes   Visual Acuity Right Eye Distance:   Left Eye Distance:   Bilateral Distance:    Right Eye Near:   Left Eye Near:    Bilateral Near:     Physical Exam Constitutional:      General: She is not in acute distress.    Appearance: Normal appearance. She is not toxic-appearing or diaphoretic.  HENT:     Head: Normocephalic and atraumatic.  Eyes:     Extraocular Movements: Extraocular movements intact.     Conjunctiva/sclera: Conjunctivae normal.  Pulmonary:     Effort: Pulmonary effort is normal.  Musculoskeletal:     Comments: Tenderness to palpation with moderate amount of swelling present to right lower leg that extends into right ankle.  Limited range of motion given pain.  Patient has minimal tenderness to palpation directly below ankle and foot.  Patient can wiggle toes.  Capillary refill and pulses intact.  No obvious discoloration, lacerations, abrasions noted.  Neurological:     General: No focal deficit present.     Mental Status: She is alert and oriented to person, place, and time. Mental status is at baseline.  Psychiatric:        Mood and Affect: Mood normal.         Behavior: Behavior normal.        Thought Content: Thought content  normal.        Judgment: Judgment normal.      UC Treatments / Results  Labs (all labs ordered are listed, but only abnormal results are displayed) Labs Reviewed - No data to display  EKG   Radiology DG Ankle Complete Right  Result Date: 06/04/2022 CLINICAL DATA:  Pain EXAM: RIGHT ANKLE - COMPLETE 3+ VIEW COMPARISON:  None Available. FINDINGS: There is recent oblique fracture in the distal metaphysis of right fibula. There is 2 mm offset in alignment of fracture fragments. There is slight prominence of joint space between medial malleolus and talus suggesting possible ligament injury. There is soft tissue swelling over the ankle, more so along the lateral aspect. IMPRESSION: Slightly displaced fracture is seen in the distal metaphyseal region of right fibula. There is minimal widening of space between medial malleolus and talus which may be a normal variation or suggest minimal subluxation due to ligament injury. Electronically Signed   By: Elmer Picker M.D.   On: 06/04/2022 12:14   DG Foot Complete Right  Result Date: 06/04/2022 CLINICAL DATA:  Pain EXAM: RIGHT FOOT COMPLETE - 3+ VIEW COMPARISON:  None Available. FINDINGS: No fracture or dislocation is seen. There are no focal lytic lesions. There are no opaque foreign bodies. No abnormal soft tissue calcifications are seen. IMPRESSION: No acute findings are seen in right foot. Electronically Signed   By: Elmer Picker M.D.   On: 06/04/2022 12:12    Procedures Procedures (including critical care time)  Medications Ordered in UC Medications - No data to display  Initial Impression / Assessment and Plan / UC Course  I have reviewed the triage vital signs and the nursing notes.  Pertinent labs & imaging results that were available during my care of the patient were reviewed by me and considered in my medical decision making (see chart for details).      Patient has fracture of right fibula.  No acute bony abnormalities of right foot.  Discussed x-ray findings with Hilbert Odor with on-call orthopedics.  He advised cam boot and weightbearing as tolerated.  Cam boot applied in urgent care and patient was supplied with crutches.  Advised ice application, elevation of extremity, and supportive care.  Discussed pain management with patient but patient declined any prescription medications including narcotics given that she is currently breast-feeding.  Patient advised to follow-up with orthopedist at provided contact information as soon as possible to schedule appointment for this week per orthopedics recommendation.  Patient verbalized understanding and was agreeable with plan. Final Clinical Impressions(s) / UC Diagnoses   Final diagnoses:  Other physeal fracture of lower end of right fibula, initial encounter for closed fracture     Discharge Instructions      You have broken your fibula which is the lower portion of your leg/ankle.  You have been placed in a boot.  You may bear weight as tolerated.  Recommend elevation and ice application.  Follow-up with orthopedist today to schedule appointment to be seen this week.    ED Prescriptions   None    PDMP not reviewed this encounter.   Teodora Medici, [place] 06/04/22 1304

## 2022-06-04 NOTE — Discharge Instructions (Signed)
You have broken your fibula which is the lower portion of your leg/ankle.  You have been placed in a boot.  You may bear weight as tolerated.  Recommend elevation and ice application.  Follow-up with orthopedist today to schedule appointment to be seen this week.

## 2022-06-06 ENCOUNTER — Other Ambulatory Visit: Payer: Self-pay | Admitting: *Deleted

## 2022-06-06 ENCOUNTER — Other Ambulatory Visit: Payer: Self-pay | Admitting: Orthopaedic Surgery

## 2022-06-06 ENCOUNTER — Ambulatory Visit
Admission: RE | Admit: 2022-06-06 | Discharge: 2022-06-06 | Disposition: A | Discharge status: Home / Self Care | Payer: No Typology Code available for payment source | Source: Ambulatory Visit | Attending: Orthopaedic Surgery | Admitting: Orthopaedic Surgery

## 2022-06-06 DIAGNOSIS — M25571 Pain in right ankle and joints of right foot: Secondary | ICD-10-CM

## 2022-06-07 ENCOUNTER — Encounter (HOSPITAL_COMMUNITY): Payer: Self-pay | Admitting: Orthopaedic Surgery

## 2022-06-07 ENCOUNTER — Other Ambulatory Visit: Payer: Self-pay

## 2022-06-08 ENCOUNTER — Encounter (HOSPITAL_BASED_OUTPATIENT_CLINIC_OR_DEPARTMENT_OTHER): Payer: Self-pay | Admitting: Orthopaedic Surgery

## 2022-06-10 NOTE — H&P (Signed)
ORTHOPAEDIC SURGERY H&P  Subjective:  The patient presents with right ankle fracture.   Past Medical History:  Diagnosis Date   Anemia    end of pregnancy   Anxiety    Asthma    Depression    GERD (gastroesophageal reflux disease)    H/O LEEP (loop electrosurgical excision procedure) of cervix complicating pregnancy    History of colposcopy    IBS (irritable bowel syndrome)    LGSIL on Pap smear of cervix     Past Surgical History:  Procedure Laterality Date   CESAREAN SECTION N/A 12/05/2021   Procedure: CESAREAN SECTION;  Surgeon: Natale Milch, MD;  Location: [facility] ORS;  Service: Obstetrics;  Laterality: N/A;   COLPOSCOPY     DILATION AND CURETTAGE OF UTERUS     INDUCED ABORTION     TONSILLECTOMY       (Not in an outpatient encounter)    Allergies  Allergen Reactions   Penicillins Hives    Social History   Socioeconomic History   Marital status: Single    Spouse name: Not on file   Number of children: 0   Years of education: Bachelors   Highest education level: Not on file  Occupational History   Not on file  Tobacco Use   Smoking status: Former    Packs/day: 0.25    Years: 14.00    Total pack years: 3.50    Types: Cigarettes    Start date: 08/20/2004    Quit date: 08/2019    Years since quitting: 2.8   Smokeless tobacco: Never  Vaping Use   Vaping Use: Never used  Substance and Sexual Activity   Alcohol use: Not Currently   Drug use: No   Sexual activity: Not Currently  Other Topics Concern   Not on file  Social History Narrative   Not on file   Social Determinants of Health   Financial Resource Strain: Not on file  Food Insecurity: Not on file  Transportation Needs: Not on file  Physical Activity: Not on file  Stress: Not on file  Social Connections: Not on file  Intimate Partner Violence: Not on file     History reviewed. No pertinent family history.   Review of Systems Pertinent items are noted in HPI.  Objective: Vital  signs in last 24 hours:    06/04/2022   11:41 AM 01/10/2022   10:30 AM 12/11/2021   10:35 AM  Vitals with BMI  Height  5\' 5"  5\' 5"   Weight  212 lbs 10 oz 226 lbs  BMI  35.38 37.61  Systolic 105 118  Diastolic 69 84 70  Pulse 78        EXAM: General: Well nourished, well developed. Awake, alert and oriented to time, place, person. Normal mood and affect. No apparent distress. Breathing room air.  Operative Lower Extremity: Alignment - Neutral Deformity - None Skin intact Tenderness to palpation - right ankle Normal range of motion 5/5 TA, PT, GS, Per, EHL, FHL Sensation intact to light touch throughout Palpable DP and PT pulses Special testing: None  The contralateral foot/ankle was examined for comparison and noted to be neurovascularly intact with no localized deformity, swelling, or tenderness.  Imaging Review All images taken were independently reviewed by me.  Assessment/Plan: The clinical and radiographic findings were reviewed and discussed at length with the patient.  The patient has right ankle fracture.  We spoke at length about the natural course of these findings. We discussed nonoperative and  operative treatment options in detail.  The risks and benefits were presented and reviewed. The risks due to implant failure/irritation, infection, stiffness, nerve/vessel/tendon injury, wound healing issues, failure of this surgery, need for further surgery, thromboembolic events, amputation, death among others were discussed. The patient acknowledged the explanation and agreed to proceed with the plan.  Armond Hang  Orthopaedic Surgery [facility]

## 2022-06-11 ENCOUNTER — Other Ambulatory Visit: Payer: Self-pay

## 2022-06-11 ENCOUNTER — Encounter (HOSPITAL_BASED_OUTPATIENT_CLINIC_OR_DEPARTMENT_OTHER): Payer: Self-pay | Admitting: Orthopaedic Surgery

## 2022-06-11 ENCOUNTER — Ambulatory Visit (HOSPITAL_BASED_OUTPATIENT_CLINIC_OR_DEPARTMENT_OTHER)
Admission: RE | Admit: 2022-06-11 | Discharge: 2022-06-11 | Disposition: A | Discharge status: Home / Self Care | Payer: Self-pay | Source: Ambulatory Visit | Attending: Orthopaedic Surgery | Admitting: Orthopaedic Surgery

## 2022-06-11 ENCOUNTER — Encounter (HOSPITAL_BASED_OUTPATIENT_CLINIC_OR_DEPARTMENT_OTHER)
Admission: RE | Disposition: A | Discharge status: Home / Self Care | Payer: Self-pay | Source: Ambulatory Visit | Attending: Orthopaedic Surgery

## 2022-06-11 ENCOUNTER — Ambulatory Visit (HOSPITAL_COMMUNITY): Payer: Self-pay

## 2022-06-11 ENCOUNTER — Ambulatory Visit (HOSPITAL_BASED_OUTPATIENT_CLINIC_OR_DEPARTMENT_OTHER): Payer: Self-pay | Admitting: Anesthesiology

## 2022-06-11 DIAGNOSIS — X58XXXA Exposure to other specified factors, initial encounter: Secondary | ICD-10-CM | POA: Insufficient documentation

## 2022-06-11 DIAGNOSIS — S8261XA Displaced fracture of lateral malleolus of right fibula, initial encounter for closed fracture: Secondary | ICD-10-CM | POA: Insufficient documentation

## 2022-06-11 DIAGNOSIS — K219 Gastro-esophageal reflux disease without esophagitis: Secondary | ICD-10-CM | POA: Insufficient documentation

## 2022-06-11 DIAGNOSIS — X501XXA Overexertion from prolonged static or awkward postures, initial encounter: Secondary | ICD-10-CM | POA: Insufficient documentation

## 2022-06-11 DIAGNOSIS — S93431A Sprain of tibiofibular ligament of right ankle, initial encounter: Secondary | ICD-10-CM | POA: Insufficient documentation

## 2022-06-11 DIAGNOSIS — Z87891 Personal history of nicotine dependence: Secondary | ICD-10-CM | POA: Insufficient documentation

## 2022-06-11 DIAGNOSIS — Z01818 Encounter for other preprocedural examination: Secondary | ICD-10-CM

## 2022-06-11 HISTORY — DX: Anemia, unspecified: D64.9

## 2022-06-11 HISTORY — PX: SYNDESMOSIS REPAIR: SHX5182

## 2022-06-11 HISTORY — PX: ORIF ANKLE FRACTURE: SHX5408

## 2022-06-11 LAB — POCT PREGNANCY, URINE: Preg Test, Ur: NEGATIVE

## 2022-06-11 SURGERY — OPEN REDUCTION INTERNAL FIXATION (ORIF) ANKLE FRACTURE
Anesthesia: Regional | Site: Ankle | Laterality: Right

## 2022-06-11 MED ORDER — PROMETHAZINE HCL 25 MG/ML IJ SOLN: 6.2500 mg | INTRAMUSCULAR | Status: DC | PRN

## 2022-06-11 MED ORDER — BUPIVACAINE HCL (PF) 0.25 % IJ SOLN
INTRAMUSCULAR | Status: DC | PRN
  Administered 2022-06-11: 10 mL via PERINEURAL
  Administered 2022-06-11: 15 mL via PERINEURAL

## 2022-06-11 MED ORDER — ACETAMINOPHEN 500 MG PO TABS
1000.0000 mg | ORAL_TABLET | Freq: Once | ORAL | Status: AC
  Administered 2022-06-11: 1000 mg via ORAL

## 2022-06-11 MED ORDER — LACTATED RINGERS IV SOLN: INTRAVENOUS | Status: DC

## 2022-06-11 MED ORDER — VANCOMYCIN HCL IN DEXTROSE 1-5 GM/200ML-% IV SOLN
1000.0000 mg | INTRAVENOUS | Status: AC
  Administered 2022-06-11: 1000 mg via INTRAVENOUS

## 2022-06-11 MED ORDER — ONDANSETRON HCL 4 MG/2ML IJ SOLN
INTRAMUSCULAR | Status: DC | PRN
  Administered 2022-06-11: 4 mg via INTRAVENOUS

## 2022-06-11 MED ORDER — ACETAMINOPHEN 500 MG PO TABS
ORAL_TABLET | ORAL | Status: AC
  Filled 2022-06-11: qty 2

## 2022-06-11 MED ORDER — FENTANYL CITRATE (PF) 100 MCG/2ML IJ SOLN
INTRAMUSCULAR | Status: AC
  Filled 2022-06-11: qty 2

## 2022-06-11 MED ORDER — VANCOMYCIN HCL 1000 MG IV SOLR
INTRAVENOUS | Status: DC | PRN
  Administered 2022-06-11: 1000 mg via INTRAVENOUS

## 2022-06-11 MED ORDER — VANCOMYCIN HCL IN DEXTROSE 1-5 GM/200ML-% IV SOLN
INTRAVENOUS | Status: AC
  Filled 2022-06-11: qty 200

## 2022-06-11 MED ORDER — FENTANYL CITRATE (PF) 100 MCG/2ML IJ SOLN
100.0000 ug | Freq: Once | INTRAMUSCULAR | Status: AC
  Administered 2022-06-11: 100 ug via INTRAVENOUS

## 2022-06-11 MED ORDER — DEXAMETHASONE SODIUM PHOSPHATE 10 MG/ML IJ SOLN
INTRAMUSCULAR | Status: DC | PRN
  Administered 2022-06-11: 5 mg via INTRAVENOUS

## 2022-06-11 MED ORDER — PROPOFOL 10 MG/ML IV BOLUS
INTRAVENOUS | Status: DC | PRN
  Administered 2022-06-11: 200 mg via INTRAVENOUS

## 2022-06-11 MED ORDER — DEXAMETHASONE SODIUM PHOSPHATE 10 MG/ML IJ SOLN
INTRAMUSCULAR | Status: AC
  Filled 2022-06-11: qty 1

## 2022-06-11 MED ORDER — POVIDONE-IODINE 10 % EX SWAB
2.0000 | Freq: Once | CUTANEOUS | Status: AC
  Administered 2022-06-11: 2 via TOPICAL

## 2022-06-11 MED ORDER — VANCOMYCIN HCL 500 MG IV SOLR
INTRAVENOUS | Status: DC | PRN
  Administered 2022-06-11: 500 mg via TOPICAL

## 2022-06-11 MED ORDER — LIDOCAINE 2% (20 MG/ML) 5 ML SYRINGE
INTRAMUSCULAR | Status: DC | PRN
  Administered 2022-06-11: 40 mg via INTRAVENOUS

## 2022-06-11 MED ORDER — POVIDONE-IODINE 10 % EX SOLN
CUTANEOUS | Status: DC | PRN
  Administered 2022-06-11: 1 via TOPICAL

## 2022-06-11 MED ORDER — CIPROFLOXACIN IN D5W 400 MG/200ML IV SOLN
INTRAVENOUS | Status: AC
  Filled 2022-06-11: qty 200

## 2022-06-11 MED ORDER — CHLORHEXIDINE GLUCONATE 4 % EX LIQD
60.0000 mL | Freq: Once | CUTANEOUS | Status: AC
  Administered 2022-06-11: 4 via TOPICAL

## 2022-06-11 MED ORDER — PROPOFOL 10 MG/ML IV BOLUS
INTRAVENOUS | Status: AC
  Filled 2022-06-11: qty 20

## 2022-06-11 MED ORDER — KETOROLAC TROMETHAMINE 30 MG/ML IJ SOLN: 30.0000 mg | Freq: Once | INTRAMUSCULAR | Status: DC | PRN

## 2022-06-11 MED ORDER — ONDANSETRON HCL 4 MG/2ML IJ SOLN
INTRAMUSCULAR | Status: AC
  Filled 2022-06-11: qty 2

## 2022-06-11 MED ORDER — OXYCODONE HCL 5 MG/5ML PO SOLN: 5.0000 mg | Freq: Once | ORAL | Status: DC | PRN

## 2022-06-11 MED ORDER — 0.9 % SODIUM CHLORIDE (POUR BTL) OPTIME
TOPICAL | Status: DC | PRN
  Administered 2022-06-11: 1000 mL

## 2022-06-11 MED ORDER — MIDAZOLAM HCL 2 MG/2ML IJ SOLN
2.0000 mg | Freq: Once | INTRAMUSCULAR | Status: AC
  Administered 2022-06-11: 2 mg via INTRAVENOUS

## 2022-06-11 MED ORDER — CIPROFLOXACIN IN D5W 400 MG/200ML IV SOLN
400.0000 mg | Freq: Once | INTRAVENOUS | Status: AC
  Administered 2022-06-11: 400 mg via INTRAVENOUS

## 2022-06-11 MED ORDER — MIDAZOLAM HCL 2 MG/2ML IJ SOLN
INTRAMUSCULAR | Status: AC
  Filled 2022-06-11: qty 2

## 2022-06-11 MED ORDER — AMISULPRIDE (ANTIEMETIC) 5 MG/2ML IV SOLN: 10.0000 mg | Freq: Once | INTRAVENOUS | Status: DC | PRN

## 2022-06-11 MED ORDER — FENTANYL CITRATE (PF) 100 MCG/2ML IJ SOLN
INTRAMUSCULAR | Status: DC | PRN
  Administered 2022-06-11: 50 ug via INTRAVENOUS
  Administered 2022-06-11 (×2): 25 ug via INTRAVENOUS

## 2022-06-11 MED ORDER — OXYCODONE HCL 5 MG PO TABS: 5.0000 mg | ORAL_TABLET | Freq: Once | ORAL | Status: DC | PRN

## 2022-06-11 MED ORDER — LIDOCAINE 2% (20 MG/ML) 5 ML SYRINGE
INTRAMUSCULAR | Status: AC
  Filled 2022-06-11: qty 5

## 2022-06-11 MED ORDER — BUPIVACAINE-EPINEPHRINE (PF) 0.5% -1:200000 IJ SOLN
INTRAMUSCULAR | Status: DC | PRN
  Administered 2022-06-11: 15 mL via PERINEURAL
  Administered 2022-06-11: 10 mL via PERINEURAL

## 2022-06-11 MED ORDER — FENTANYL CITRATE (PF) 100 MCG/2ML IJ SOLN: 25.0000 ug | INTRAMUSCULAR | Status: DC | PRN

## 2022-06-11 SURGICAL SUPPLY — 83 items
APL PRP STRL LF DISP 70% ISPRP (MISCELLANEOUS) ×2
BANDAGE ESMARK 6X9 LF (GAUZE/BANDAGES/DRESSINGS) ×1 IMPLANT
BIT DRILL 2.5X2.75 QC CALB (BIT) IMPLANT
BIT DRILL CALIBRATED 2.7 (BIT) IMPLANT
BLADE SURG 15 STRL LF DISP TIS (BLADE) ×4 IMPLANT
BLADE SURG 15 STRL SS (BLADE) ×7
BNDG CMPR 5X4 CHSV STRCH STRL (GAUZE/BANDAGES/DRESSINGS) ×1
BNDG CMPR 9X6 STRL LF SNTH (GAUZE/BANDAGES/DRESSINGS)
BNDG COHESIVE 4X5 TAN STRL LF (GAUZE/BANDAGES/DRESSINGS) ×1 IMPLANT
BNDG ELASTIC 4X5.8 VLCR STR LF (GAUZE/BANDAGES/DRESSINGS) ×1 IMPLANT
BNDG ELASTIC 6X5.8 VLCR STR LF (GAUZE/BANDAGES/DRESSINGS) ×1 IMPLANT
BNDG ESMARK 6X9 LF (GAUZE/BANDAGES/DRESSINGS)
BNDG GAUZE DERMACEA FLUFF 4 (GAUZE/BANDAGES/DRESSINGS) ×1 IMPLANT
BNDG GZE DERMACEA 4 6PLY (GAUZE/BANDAGES/DRESSINGS) ×1
BRUSH SCRUB EZ  4% CHG (MISCELLANEOUS) ×1
BRUSH SCRUB EZ 4% CHG (MISCELLANEOUS) ×1 IMPLANT
CANISTER SUCT 1200ML W/VALVE (MISCELLANEOUS) ×1 IMPLANT
CHLORAPREP W/TINT 26 (MISCELLANEOUS) ×2 IMPLANT
COVER BACK TABLE 60X90IN (DRAPES) ×1 IMPLANT
CUFF TOURN SGL QUICK 34 (TOURNIQUET CUFF)
CUFF TRNQT CYL 34X4.125X (TOURNIQUET CUFF) IMPLANT
DRAPE C-ARM 42X72 X-RAY (DRAPES) ×1 IMPLANT
DRAPE C-ARMOR (DRAPES) ×1 IMPLANT
DRAPE EXTREMITY T 121X128X90 (DISPOSABLE) ×1 IMPLANT
DRAPE IMP U-DRAPE 54X76 (DRAPES) ×1 IMPLANT
DRAPE U-SHAPE 47X51 STRL (DRAPES) ×1 IMPLANT
DRSG MEPITEL 4X7.2 (GAUZE/BANDAGES/DRESSINGS) IMPLANT
ELECT REM PT RETURN 9FT ADLT (ELECTROSURGICAL) ×1
ELECTRODE REM PT RTRN 9FT ADLT (ELECTROSURGICAL) ×1 IMPLANT
FIXATION ZIPTIGHT ANKLE SNDSMS (Ankle) IMPLANT
GAUZE PAD ABD 8X10 STRL (GAUZE/BANDAGES/DRESSINGS) ×5 IMPLANT
GAUZE SPONGE 4X4 12PLY STRL (GAUZE/BANDAGES/DRESSINGS) ×1 IMPLANT
GAUZE XEROFORM 1X8 LF (GAUZE/BANDAGES/DRESSINGS) ×1 IMPLANT
GLOVE BIOGEL M STRL SZ7.5 (GLOVE) ×2 IMPLANT
GLOVE BIOGEL PI IND STRL 8 (GLOVE) ×1 IMPLANT
GOWN STRL REUS W/ TWL LRG LVL3 (GOWN DISPOSABLE) ×2 IMPLANT
GOWN STRL REUS W/ TWL XL LVL3 (GOWN DISPOSABLE) IMPLANT
GOWN STRL REUS W/TWL LRG LVL3 (GOWN DISPOSABLE) ×3
GOWN STRL REUS W/TWL XL LVL3 (GOWN DISPOSABLE) ×1
K-WIRE ACE 1.6X6 (WIRE) ×2
KWIRE ACE 1.6X6 (WIRE) IMPLANT
MARKER SKIN DUAL TIP RULER LAB (MISCELLANEOUS) IMPLANT
NEEDLE HYPO 22GX1.5 SAFETY (NEEDLE) IMPLANT
NS IRRIG 1000ML POUR BTL (IV SOLUTION) ×1 IMPLANT
PACK BASIN DAY SURGERY FS (CUSTOM PROCEDURE TRAY) ×1 IMPLANT
PAD CAST 4YDX4 CTTN HI CHSV (CAST SUPPLIES) ×1 IMPLANT
PADDING CAST ABS COTTON 4X4 ST (CAST SUPPLIES) IMPLANT
PADDING CAST COTTON 4X4 STRL (CAST SUPPLIES) ×1
PADDING CAST COTTON 6X4 STRL (CAST SUPPLIES) ×1 IMPLANT
PADDING CAST SYNTHETIC 4X4 STR (CAST SUPPLIES) ×4 IMPLANT
PENCIL SMOKE EVACUATOR (MISCELLANEOUS) ×1 IMPLANT
PLATE LOCK 7H 92 BILAT FIB (Plate) IMPLANT
PUTTY DBM STAGRAFT 2CC (Putty) IMPLANT
SANITIZER HAND PURELL 535ML FO (MISCELLANEOUS) ×1 IMPLANT
SCREW LOCK 3.5X12 DIST TIB (Screw) IMPLANT
SCREW LOCK 3.5X14 DIST TIB (Screw) IMPLANT
SCREW LOCK CORT STAR 3.5X10 (Screw) IMPLANT
SCREW LOCK CORT STAR 3.5X12 (Screw) IMPLANT
SCREW LOW PROFILE 12MMX3.5MM (Screw) IMPLANT
SHEET MEDIUM DRAPE 40X70 STRL (DRAPES) ×1 IMPLANT
SLEEVE SCD COMPRESS KNEE MED (STOCKING) ×1 IMPLANT
SPIKE FLUID TRANSFER (MISCELLANEOUS) IMPLANT
SPLINT PLASTER CAST FAST 5X30 (CAST SUPPLIES) ×20 IMPLANT
SPONGE T-LAP 18X18 ~~LOC~~+RFID (SPONGE) ×1 IMPLANT
STOCKINETTE 6  STRL (DRAPES) ×1
STOCKINETTE 6 STRL (DRAPES) ×1 IMPLANT
STOCKINETTE ORTHO 6X25 (MISCELLANEOUS) ×1 IMPLANT
SUCTION FRAZIER HANDLE 10FR (MISCELLANEOUS) ×1
SUCTION TUBE FRAZIER 10FR DISP (MISCELLANEOUS) IMPLANT
SUT ETHILON 2 0 FS 18 (SUTURE) ×2 IMPLANT
SUT ETHILON 3 0 PS 1 (SUTURE) IMPLANT
SUT MNCRL AB 3-0 PS2 18 (SUTURE) ×1 IMPLANT
SUT VIC AB 2-0 SH 27 (SUTURE) ×1
SUT VIC AB 2-0 SH 27XBRD (SUTURE) ×1 IMPLANT
SUT VIC AB 3-0 SH 27 (SUTURE) ×1
SUT VIC AB 3-0 SH 27X BRD (SUTURE) IMPLANT
SUT VICRYL 0 SH 27 (SUTURE) IMPLANT
SYR BULB EAR ULCER 3OZ GRN STR (SYRINGE) ×1 IMPLANT
SYR CONTROL 10ML LL (SYRINGE) IMPLANT
TOWEL GREEN STERILE FF (TOWEL DISPOSABLE) ×2 IMPLANT
TUBE CONNECTING 20X1/4 (TUBING) ×1 IMPLANT
UNDERPAD 30X36 HEAVY ABSORB (UNDERPADS AND DIAPERS) ×1 IMPLANT
ZIPTIGHT ANKLE SYNODESMOSS FIX (Ankle) ×1 IMPLANT

## 2022-06-11 NOTE — Discharge Instructions (Addendum)
Armond Hang, MD [facility]  Please read the following information regarding your care after surgery.  IMPORTANT: Patient is to review with her pediatrician about any postop medications due to ongoing breastfeeding her infant.  Medications  You only need a prescription for the narcotic pain medicine (ex. oxycodone, Percocet, Norco).  All of the other medicines listed below are available over the counter. ? Aleve 2 pills twice a day for the first 3 days after surgery. ? acetominophen (Tylenol) 650 mg every 4-6 hours as you need for minor to moderate pain ? oxycodone as prescribed for severe pain  ? To help prevent blood clots, take aspirin (81 mg) twice daily for 42 days after surgery (or total duration of nonweightbearing).  You should also get up every hour while you are awake to move around.  Weight Bearing ? Do NOT bear any weight on the operated leg or foot. This means do NOT touch your surgical leg to the ground!  Cast / Splint / Dressing ? If you have a splint, do NOT remove this. Keep your splint, cast or dressing clean and dry.  Don't put anything (coat hanger, pencil, etc) down inside of it.  If it gets wet, call the office immediately to schedule an appointment for a cast change.  Swelling IMPORTANT: It is normal for you to have swelling where you had surgery. To reduce swelling and pain, keep at least 3 pillows under your leg so that your toes are above your nose and your heel is above the level of your hip.  It may be necessary to keep your foot or leg elevated for several weeks.  This is critical to helping your incisions heal and your pain to feel better.  Follow Up Call my office at [phone] when you are discharged from the hospital or surgery center to schedule an appointment to be seen 7-10 days after surgery.  Call my office at [phone] if you develop a fever >101.5 F, nausea, vomiting, bleeding from the surgical site or severe pain.      May have  Tylenol today, 06/11/2022, after 2:00PM.   Post Anesthesia Home Care Instructions  Activity: Get plenty of rest for the remainder of the day. A responsible individual must stay with you for 24 hours following the procedure.  For the next 24 hours, DO NOT: -Drive a car -Paediatric nurse -Drink alcoholic beverages -Take any medication unless instructed by your physician -Make any legal decisions or sign important papers.  Meals: Start with liquid foods such as gelatin or soup. Progress to regular foods as tolerated. Avoid greasy, spicy, heavy foods. If nausea and/or vomiting occur, drink only clear liquids until the nausea and/or vomiting subsides. Call your physician if vomiting continues.  Special Instructions/Symptoms: Your throat may feel dry or sore from the anesthesia or the breathing tube placed in your throat during surgery. If this causes discomfort, gargle with warm salt water. The discomfort should disappear within 24 hours.  If you had a scopolamine patch placed behind your ear for the management of post- operative nausea and/or vomiting:  1. The medication in the patch is effective for 72 hours, after which it should be removed.  Wrap patch in a tissue and discard in the trash. Wash hands thoroughly with soap and water. 2. You may remove the patch earlier than 72 hours if you experience unpleasant side effects which may include dry mouth, dizziness or visual disturbances. 3. Avoid touching the patch. Wash your hands with soap and water after  contact with the patch.

## 2022-06-11 NOTE — H&P (Signed)
H&P Update: ? ?-History and Physical Reviewed ? ?-Patient has been re-examined ? ?-No change in the plan of care ? ?-The risks and benefits were presented and reviewed. The risks due to hardware failure/irritation, new/persistent infection, stiffness, nerve/vessel/tendon injury, nonunion/malunion, wound healing issues, development of arthritis, failure of this surgery, possibility of external fixation with delayed definitive surgery, need for further surgery, thromboembolic events, anesthesia/medical complications, amputation, death among others were discussed. The patient acknowledged the explanation, agreed to proceed with the plan and a consent was signed. ? ?April Harris ? ?

## 2022-06-11 NOTE — Anesthesia Preprocedure Evaluation (Addendum)
Anesthesia Evaluation  Patient identified by MRN, date of birth, ID band Patient awake    Reviewed: Allergy & Precautions, NPO status , Patient's Chart, lab work & pertinent test results  Airway Mallampati: I  TM Distance: >3 FB Neck ROM: Full    Dental no notable dental hx.    Pulmonary former smoker,    Pulmonary exam normal        Cardiovascular negative cardio ROS Normal cardiovascular exam     Neuro/Psych PSYCHIATRIC DISORDERS Anxiety Depression negative neurological ROS     GI/Hepatic Neg liver ROS, GERD  Medicated and Controlled,  Endo/Other  negative endocrine ROS  Renal/GU negative Renal ROS     Musculoskeletal negative musculoskeletal ROS (+)   Abdominal (+) + obese,   Peds  Hematology negative hematology ROS (+)   Anesthesia Other Findings closed fracture of lateral malleolus of right fibula  Reproductive/Obstetrics hcg negative                           Anesthesia Physical Anesthesia Plan  ASA: 2  Anesthesia Plan: General and Regional   Post-op Pain Management: Regional block*   Induction: Intravenous  PONV Risk Score and Plan: 3 and Ondansetron, Dexamethasone, Midazolam and Treatment may vary due to age or medical condition  Airway Management Planned: LMA  Additional Equipment:   Intra-op Plan:   Post-operative Plan: Extubation in OR  Informed Consent: I have reviewed the patients History and Physical, chart, labs and discussed the procedure including the risks, benefits and alternatives for the proposed anesthesia with the patient or authorized representative who has indicated his/her understanding and acceptance.     Dental advisory given  Plan Discussed with: CRNA  Anesthesia Plan Comments:        Anesthesia Quick Evaluation

## 2022-06-11 NOTE — Anesthesia Procedure Notes (Signed)
Anesthesia Regional Block: Popliteal block   Pre-Anesthetic Checklist: , timeout performed,  Correct Patient, Correct Site, Correct Laterality,  Correct Procedure, Correct Position, site marked,  Risks and benefits discussed,  Surgical consent,  Pre-op evaluation,  At surgeon's request and post-op pain management  Laterality: Right  Prep: chloraprep       Needles:  Injection technique: Single-shot  Needle Type: Echogenic Stimulator Needle     Needle Length: 10cm  Needle Gauge: 20     Additional Needles:   Procedures:,,,, ultrasound used (permanent image in chart),,    Narrative:  Start time: 06/11/2022 9:25 AM End time: 06/11/2022 9:35 AM Injection made incrementally with aspirations every 5 mL.  Performed by: Personally  Anesthesiologist: Murvin Natal, MD  Additional Notes: Functioning IV was confirmed and monitors were applied.  A timeout was performed. Sterile prep, hand hygiene and sterile gloves were used. A 147mm 20ga Bbraun echogenic stimulator needle was used. Negative aspiration and negative test dose prior to incremental administration of local anesthetic. The patient tolerated the procedure well.  Ultrasound guidance: relevent anatomy identified, needle position confirmed, local anesthetic spread visualized around nerve(s), vascular puncture avoided.  Image printed for medical record.

## 2022-06-11 NOTE — Anesthesia Procedure Notes (Signed)
Anesthesia Regional Block: Adductor canal block   Pre-Anesthetic Checklist: , timeout performed,  Correct Patient, Correct Site, Correct Laterality,  Correct Procedure,, site marked,  Risks and benefits discussed,  Surgical consent,  Pre-op evaluation,  At surgeon's request and post-op pain management  Laterality: Right  Prep: chloraprep       Needles:  Injection technique: Single-shot  Needle Type: Echogenic Stimulator Needle     Needle Length: 9cm  Needle Gauge: 21     Additional Needles:   Procedures:,,,, ultrasound used (permanent image in chart),,    Narrative:  Start time: 06/11/2022 9:35 AM End time: 06/11/2022 9:45 AM Injection made incrementally with aspirations every 5 mL.  Performed by: Personally  Anesthesiologist: Murvin Natal, MD  Additional Notes: Functioning IV was confirmed and monitors were applied. A time-out was performed. Hand hygiene and sterile gloves were used. The thigh was placed in a frog-leg position and prepped in a sterile fashion. A 21mm 21ga Arrow echogenic stimulator needle was placed using ultrasound guidance.  Negative aspiration and negative test dose prior to incremental administration of local anesthetic. The patient tolerated the procedure well.

## 2022-06-11 NOTE — Transfer of Care (Signed)
Immediate Anesthesia Transfer of Care Note  Patient: April Harris  Procedure(s) Performed: OPEN REDUCTION INTERNAL FIXATION (ORIF) ANKLE FRACTURE,  syndesmosis repair ,  with allograft (Right: Ankle) SYNDESMOSIS REPAIR RIGHT ANKLE (Right: Ankle)  Patient Location: PACU  Anesthesia Type:GA combined with regional for post-op pain  Level of Consciousness: awake, alert  and oriented  Airway & Oxygen Therapy: Patient Spontanous Breathing and Patient connected to face mask oxygen  Post-op Assessment: Report given to RN and Post -op Vital signs reviewed and stable  Post vital signs: Reviewed and stable  Last Vitals:  Vitals Value Taken Time  BP 124/72 06/11/22 1216  Temp    Pulse 81 06/11/22 1219  Resp 16 06/11/22 1219  SpO2 100 % 06/11/22 1219  Vitals shown include unvalidated device data.  Last Pain:  Vitals:   06/11/22 0739  TempSrc: Oral  PainSc: 2       Patients Stated Pain Goal: 5 (38/[id number])  Complications: No notable events documented.

## 2022-06-11 NOTE — Anesthesia Postprocedure Evaluation (Signed)
Anesthesia Post Note  Patient: April Harris  Procedure(s) Performed: OPEN REDUCTION INTERNAL FIXATION (ORIF) ANKLE FRACTURE,  syndesmosis repair ,  with allograft (Right: Ankle) SYNDESMOSIS REPAIR RIGHT ANKLE (Right: Ankle)     Patient location during evaluation: PACU Anesthesia Type: Regional and General Level of consciousness: awake Pain management: pain level controlled Vital Signs Assessment: post-procedure vital signs reviewed and stable Respiratory status: spontaneous breathing, nonlabored ventilation, respiratory function stable and patient connected to nasal cannula oxygen Cardiovascular status: blood pressure returned to baseline and stable Postop Assessment: no apparent nausea or vomiting Anesthetic complications: no   No notable events documented.  Last Vitals:  Vitals:   06/11/22 1254 06/11/22 1310  BP: 107/70 110/72  Pulse: 73 69  Resp: 15 18  Temp:  36.8 C  SpO2: 95% 97%    Last Pain:  Vitals:   06/11/22 1310  TempSrc:   PainSc: 0-No pain                 Denny Lave P Arayah Krouse

## 2022-06-11 NOTE — Anesthesia Procedure Notes (Addendum)
Procedure Name: LMA Insertion Date/Time: 06/11/2022 10:41 AM  Performed by: Maryella Shivers, CRNAPre-anesthesia Checklist: Patient identified, Emergency Drugs available, Suction available and Patient being monitored Patient Re-evaluated:Patient Re-evaluated prior to induction Oxygen Delivery Method: Circle system utilized Preoxygenation: Pre-oxygenation with 100% oxygen Induction Type: IV induction Ventilation: Mask ventilation without difficulty LMA: LMA inserted LMA Size: 4.0 Number of attempts: 1 Airway Equipment and Method: Bite block Placement Confirmation: positive ETCO2 Tube secured with: Tape Dental Injury: Teeth and Oropharynx as per pre-operative assessment

## 2022-06-11 NOTE — Progress Notes (Signed)
Assisted Dr. Roanna Banning with right, adductor canal and popliteal, ultrasound guided block. Side rails up, monitors on throughout procedure. See vital signs in flow sheet. Tolerated Procedure well.

## 2022-06-11 NOTE — Op Note (Signed)
06/11/2022  1:08 PM   PATIENT: April Harris  31 y.o. female  MRN: [id number]   PRE-OPERATIVE DIAGNOSIS:   Closed fracture of lateral malleolus of right fibula   POST-OPERATIVE DIAGNOSIS:   Same   PROCEDURE: Open reduction internal fixation right distal fibula Open reduction internal fixation right ankle syndesmosis   SURGEON:  Armond Hang, MD   ASSISTANT: None   ANESTHESIA: General, regional   EBL: Minimal   TOURNIQUET:    Total Tourniquet Time Documented: Thigh (Right) - 61 minutes Total: Thigh (Right) - 61 minutes    COMPLICATIONS: None apparent   DISPOSITION: Extubated, awake and stable to recovery.   INDICATION FOR PROCEDURE: The patient presented with right ankle fracture. Patient reports that she stepped off the front porch when she tripped and twisted her ankle (DOI 06/04/2022). She is generally healthy - she is currently breastfeeding her 43 month old infant Lead.  We discussed the diagnosis, alternative treatment options, risks and benefits of the above surgical intervention, as well as alternative non-operative treatments. All questions/concerns were addressed and the patient/family demonstrated appropriate understanding of the diagnosis, the procedure, the postoperative course, and overall prognosis. The patient wished to proceed with surgical intervention and signed an informed surgical consent as such, in each others presence prior to surgery.   PROCEDURE IN DETAIL: After preoperative consent was obtained and the correct operative site was identified, the patient was brought to the operating room supine on stretcher and transferred onto operating table. General anesthesia was induced. Preoperative antibiotics were administered. Surgical timeout was taken. The patient was then positioned supine with an ipsilateral hip bump. The operative lower extremity was prepped and draped in standard sterile fashion with a tourniquet around the thigh.  The extremity was exsanguinated and the tourniquet was inflated to 300 mmHg.  A standard lateral incision was made over the distal fibula. Dissection was carried down to the level of the fibula and the fracture site identified. The superficial peroneal nerve was identified and protected throughout the procedure. The fibula was noted to be shortened with interposed periosteum. The fibula was brought out to length. The fibula fracture was debrided and the edges defined to achieve cortical read. Reduction maneuver was performed using pointed reduction forceps and lobster forceps. In this manner, the fibula length was restored and fracture reduced. A lag screw was not placed given the orientation of fracture lines and comminution. Due to poor bone quality and extensive comminution at the fracture site, it was decided to use a locking distal fibula plate. We then selected a Zimmer locking plate to match the anatomy of the distal fibula and placed it laterally. This was implanted under intraoperative fluoroscopy with a combination of distal locking screws and proximal cortical & locking screws.  A manual external rotation stress radiograph was obtained and demonstrated widening of the ankle mortise. Given this intraoperative finding as well as preoperative subluxation, it was decided to reduce and fix the syndesmosis. Therefore a suture fixation system (ZipTight device) was implanted through the fibula plate in cannulated fashion to fix the syndesmosis. Anchor/button position was verified along anteromedial tibial cortex by fluoroscopy. A repeat stress radiograph showed complete stability of the ankle mortise to testing.  The surgical sites were thoroughly irrigated. The tourniquet was deflated and hemostasis achieved. The deep layers were closed using 2-0 vicryl. The skin was closed without tension using 2-0 nylon suture.    The leg was cleaned with saline and sterile mepitel dressings with gauze were applied. A  well padded bulky short leg splint was applied. The patient was awakened from anesthesia and transported to the recovery room in stable condition.    FOLLOW UP PLAN: -transfer to PACU, then home -strict NWB operative extremity, maximum elevation -maintain short leg splint until follow up -she will review all postop meds with her pediatrician before taking as she is currently breastfeeding -DVT ppx: Aspirin 81 mg twice daily while NWB -follow up as outpatient on Fri 10/27 for wound check -sutures out in 2-3 weeks with exchange of short leg splint to short leg cast in outpatient office   RADIOGRAPHS: AP, lateral, oblique and stress radiographs of the right ankle were obtained intraoperatively. These showed interval reduction and fixation of the fractures. Manual stress radiographs were taken and the ankle mortise and tibiofibular relationship were noted to be stable following fixation. All hardware is appropriately positioned and of the appropriate lengths. No other acute injuries are noted.   Armond Hang Orthopaedic Surgery [facility]

## 2022-06-12 ENCOUNTER — Encounter (HOSPITAL_BASED_OUTPATIENT_CLINIC_OR_DEPARTMENT_OTHER): Payer: Self-pay | Admitting: Orthopaedic Surgery

## 2022-09-06 ENCOUNTER — Telehealth: Payer: Self-pay | Admitting: Physician Assistant

## 2022-09-06 DIAGNOSIS — R3989 Other symptoms and signs involving the genitourinary system: Secondary | ICD-10-CM

## 2022-09-06 MED ORDER — CEPHALEXIN 500 MG PO CAPS: 500.0000 mg | ORAL_CAPSULE | Freq: Two times a day (BID) | ORAL | 0 refills | Status: AC

## 2022-09-06 NOTE — Progress Notes (Signed)

## 2022-09-06 NOTE — Progress Notes (Signed)
I have spent 5 minutes in review of e-visit questionnaire, review and updating patient chart, medical decision making and response to patient.   Jimmylee Ratterree Cody Rad Gramling, PA-C    

## 2022-09-25 ENCOUNTER — Encounter: Payer: Self-pay | Admitting: Family Medicine

## 2022-09-25 ENCOUNTER — Ambulatory Visit (INDEPENDENT_AMBULATORY_CARE_PROVIDER_SITE_OTHER): Payer: Self-pay | Admitting: Family Medicine

## 2022-09-25 VITALS — BP 118/70 | HR 55 | Temp 97.3°F | Ht 65.0 in | Wt 202.0 lb

## 2022-09-25 DIAGNOSIS — E7800 Pure hypercholesterolemia, unspecified: Secondary | ICD-10-CM

## 2022-09-25 DIAGNOSIS — Z8781 Personal history of (healed) traumatic fracture: Secondary | ICD-10-CM

## 2022-09-25 DIAGNOSIS — Z9889 Other specified postprocedural states: Secondary | ICD-10-CM | POA: Insufficient documentation

## 2022-09-25 DIAGNOSIS — K219 Gastro-esophageal reflux disease without esophagitis: Secondary | ICD-10-CM | POA: Insufficient documentation

## 2022-09-25 DIAGNOSIS — J3089 Other allergic rhinitis: Secondary | ICD-10-CM | POA: Insufficient documentation

## 2022-09-25 DIAGNOSIS — F418 Other specified anxiety disorders: Secondary | ICD-10-CM

## 2022-09-25 DIAGNOSIS — Z130 Encounter for screening for diseases of the blood and blood-forming organs and certain disorders involving the immune mechanism: Secondary | ICD-10-CM

## 2022-09-25 DIAGNOSIS — F17200 Nicotine dependence, unspecified, uncomplicated: Secondary | ICD-10-CM | POA: Insufficient documentation

## 2022-09-25 MED ORDER — SERTRALINE HCL 50 MG PO TABS: 50.0000 mg | ORAL_TABLET | Freq: Every day | ORAL | 3 refills | Status: DC

## 2022-09-25 NOTE — Assessment & Plan Note (Signed)
I will resume her sertraline 50 mg daily.

## 2022-09-25 NOTE — Assessment & Plan Note (Signed)
She will continue to follow with orthopedics. 

## 2022-09-25 NOTE — Assessment & Plan Note (Signed)
I will have her return for fastin lipids to reassess this.

## 2022-09-25 NOTE — Progress Notes (Signed)
[facility] [address] [place] [address] Dept: [phone] Dept Fax: [phone]  Transfer of Care Office Visit  Subjective:    Patient ID: April Harris, female    DOB: Nov 07, 1990, 32 y.o..   MRN: [id number]  Chief Complaint  Patient presents with   Establish Care    NP- establish care.  No concerns.      History of Present Illness:  Patient is in today to establish care. Ms. Delon was born in [place], [place]. 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 She lived for a time in [place], [place] and in [place], but is now back in [place]. 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 She has a So and a 63 month old daughter. She has had a relapse to light tobacco use recently. She has a past history of alcoholism. Currently, she occasionally has a glass of wine with dinner. She denies any drug use.  Ms. Ditullio has a history of anxiety with depression. She has been managed on sertraline 50 mg daily. She had run out of this and feels this stimulate her relapse to tobacco use.  Ms. Elling underwent ORIF of a right distal fibular fracture in Oct. She hopes to be out of her walking boot soon.  Past Medical History: Patient Active Problem List   Diagnosis Date Noted   Anxiety with depression 09/25/2022   Perennial allergic rhinitis 09/25/2022   GERD (gastroesophageal reflux disease) 09/25/2022   S/P ORIF (open reduction internal fixation) right lateral malleolar fracture 09/25/2022   History of loop electrosurgical excision procedure (LEEP) 06/01/2021   Vitamin D insufficiency 08/11/2019   Chronic alcoholism in remission ([place]) 05/30/2019   Class 1 obesity due to excess calories with body mass index (BMI) of 33.0 to 33.9 in adult 05/30/2019   Spondylisthesis 05/27/2019   h/o Hypercholesterolemia 11/12/2017   Menorrhagia with  regular cycle 11/07/2017   Irritable bowel syndrome with diarrhea 11/16/2009   Past Surgical History:  Procedure Laterality Date   CESAREAN SECTION N/A 12/05/2021   Procedure: CESAREAN SECTION;  Surgeon: Homero Fellers, MD;  Location: [facility];  Service: Obstetrics;  Laterality: N/A;   COLPOSCOPY     DILATION AND CURETTAGE OF UTERUS     INDUCED ABORTION     ORIF ANKLE FRACTURE Right 06/11/2022   Procedure: OPEN REDUCTION INTERNAL FIXATION (ORIF) ANKLE FRACTURE,  syndesmosis repair ,  with allograft;  Surgeon: Armond Hang, MD;  Location: [facility];  Service: Orthopedics;  Laterality: Right;  60 min choice   SYNDESMOSIS REPAIR Right 06/11/2022   Procedure: SYNDESMOSIS REPAIR RIGHT ANKLE;  Surgeon: Armond Hang, MD;  Location: [facility];  Service: Orthopedics;  Laterality: Right;   TONSILLECTOMY     Family History  Problem Relation Age of Onset   Heart attack Maternal Grandfather    Leukemia Maternal Grandfather    Depression Mother    Hyperlipidemia Mother    Depression Maternal Grandmother    Hypertension Paternal Grandmother    Melanoma Paternal Grandfather    Outpatient Medications Prior to Visit  Medication Sig Dispense Refill   calcium carbonate (OSCAL) 1500 (600 Ca) MG TABS tablet Take 1,500 mg by mouth daily.     cetirizine (ZYRTEC) 10 MG tablet Take 10 mg by mouth daily.     cholecalciferol (VITAMIN D3) 25 MCG (1000 UNIT) tablet Take 1,000 Units by mouth daily.  naphazoline-pheniramine (EYE ALLERGY RELIEF) 0.025-0.3 % ophthalmic solution 1 drop 4 (four) times daily as needed for eye irritation or allergies.     norethindrone (MICRONOR) 0.35 MG tablet Take 1 tablet (0.35 mg total) by mouth daily. 28 tablet 11   omeprazole (PRILOSEC) 20 MG capsule TAKE 1 CAPSULE BY MOUTH EVERY DAY 90 capsule 0   Prenatal Vit-Fe Fumarate-FA (PRENATAL MULTIVITAMIN) TABS tablet Take 1 tablet by mouth daily.     sertraline (ZOLOFT) 50 MG tablet  TAKE 1 TABLET BY MOUTH EVERYDAY AT BEDTIME (Patient taking differently: Take 50 mg by mouth at bedtime.) 90 tablet 1   ibuprofen (ADVIL) 200 MG tablet Take 800 mg by mouth every 8 (eight) hours as needed for moderate pain.     No facility-administered medications prior to visit.   Allergies  Allergen Reactions   Penicillins Hives   Objective:   Today's Vitals   09/25/22 1516  BP: 118/70  Pulse: (!) 55  Temp: (!) 97.3 F (36.3 C)  TempSrc: Temporal  SpO2: 97%  Weight: 202 lb (91.6 kg)  Height: 5\' 5"  (1.651 m)   Body mass index is 33.61 kg/m.   General: Well developed, well nourished. No acute distress. Psych: Alert and oriented. Normal mood and affect.  There are no preventive care reminders to display for this patient.    Assessment & Plan:   Problem List Items Addressed This Visit       Other   h/o Hypercholesterolemia (Chronic)    I will have her return for fastin lipids to reassess this.      Relevant Orders   Lipid panel   Anxiety with depression - Primary    I will resume her sertraline 50 mg daily.      Relevant Medications   sertraline (ZOLOFT) 50 MG tablet   S/P ORIF (open reduction internal fixation) right lateral malleolar fracture    She will continue to follow with orthopedics.      Other Visit Diagnoses     Screening for deficiency anemia       In follow-up form her delivery last year, I will reassess her CBC.   Relevant Orders   CBC       Return for Annual preventative care.   Haydee Salter, MD

## 2022-10-02 ENCOUNTER — Encounter: Payer: Self-pay | Admitting: Family Medicine

## 2022-10-02 ENCOUNTER — Other Ambulatory Visit (INDEPENDENT_AMBULATORY_CARE_PROVIDER_SITE_OTHER): Payer: Self-pay

## 2022-10-02 DIAGNOSIS — E7800 Pure hypercholesterolemia, unspecified: Secondary | ICD-10-CM

## 2022-10-02 DIAGNOSIS — Z130 Encounter for screening for diseases of the blood and blood-forming organs and certain disorders involving the immune mechanism: Secondary | ICD-10-CM

## 2022-10-02 LAB — LIPID PANEL
Cholesterol: 237 mg/dL — ABNORMAL HIGH (ref 0–200)
HDL: 40.2 mg/dL (ref >39.00)
LDL Cholesterol: 166 mg/dL — ABNORMAL HIGH (ref 0–99)
NonHDL: 196.84
Total CHOL/HDL Ratio: 6
Triglycerides: 152 mg/dL — ABNORMAL HIGH (ref 0.0–149.0)
VLDL: 30.4 mg/dL (ref 0.0–40.0)

## 2022-10-02 LAB — CBC
HCT: 40.8 % (ref 36.0–46.0)
Hemoglobin: 13.7 g/dL (ref 12.0–15.0)
MCHC: 33.5 g/dL (ref 30.0–36.0)
MCV: 89.8 fl (ref 78.0–100.0)
Platelets: 197 10*3/uL (ref 150.0–400.0)
RBC: 4.54 Mil/uL (ref 3.87–5.11)
RDW: 13.3 % (ref 11.5–15.5)
WBC: 6.4 10*3/uL (ref 4.0–10.5)

## 2022-12-10 ENCOUNTER — Encounter: Payer: Self-pay | Admitting: Family Medicine

## 2022-12-26 ENCOUNTER — Other Ambulatory Visit: Payer: Self-pay | Admitting: Obstetrics and Gynecology

## 2022-12-26 DIAGNOSIS — Z3041 Encounter for surveillance of contraceptive pills: Secondary | ICD-10-CM

## 2022-12-27 NOTE — Telephone Encounter (Signed)
Chart reviewed. Patient last seen for 6 wk ppc 01/10/22 by Dr. Jolayne Panther. Annual Due. Patient aware. Transferred to April M for scheduling. 1 refill sent.

## 2022-12-27 NOTE — Telephone Encounter (Signed)
TRIAGE VOICEMAIL: Patient following up on pharmacy request for refill of norethindrone (MICRONOR) 0.35 MG tablet. She has one pill left.

## 2023-01-18 ENCOUNTER — Ambulatory Visit: Payer: Self-pay | Admitting: Certified Nurse Midwife

## 2023-01-19 ENCOUNTER — Other Ambulatory Visit: Payer: Self-pay | Admitting: Certified Nurse Midwife

## 2023-01-19 DIAGNOSIS — Z3041 Encounter for surveillance of contraceptive pills: Secondary | ICD-10-CM

## 2023-01-25 ENCOUNTER — Encounter: Payer: Self-pay | Admitting: Licensed Practical Nurse

## 2023-01-25 ENCOUNTER — Ambulatory Visit (INDEPENDENT_AMBULATORY_CARE_PROVIDER_SITE_OTHER): Payer: 59 | Admitting: Licensed Practical Nurse

## 2023-01-25 VITALS — BP 132/62 | HR 79 | Wt 195.4 lb

## 2023-01-25 DIAGNOSIS — Z01419 Encounter for gynecological examination (general) (routine) without abnormal findings: Secondary | ICD-10-CM | POA: Diagnosis not present

## 2023-01-25 DIAGNOSIS — Z3041 Encounter for surveillance of contraceptive pills: Secondary | ICD-10-CM

## 2023-01-25 MED ORDER — NORETHINDRONE 0.35 MG PO TABS: 1.0000 | ORAL_TABLET | Freq: Every day | ORAL | 12 refills | Status: DC

## 2023-01-25 NOTE — Progress Notes (Signed)
Gynecology Annual Exam   PCP: Loyola Mast, MD  Chief Complaint:  Chief Complaint  Patient presents with   Gynecologic Exam    History of Present Illness: Patient is a 32 y.o. [id number] presents for annual exam. The patient has no complaints today. She needs a refill  on her birth control   BP, has had occasional elevated reading, is is being managed by her PCP   LMP: Patient's last menstrual period was 03/07/2021 (approximate). Currently breastfeeding, has not had a cycle.  Postcoital Bleeding: no Dysmenorrhea: no  The patient is sexually active with one female partner. She currently uses oral progesterone-only contraceptive for contraception. She denies dyspareunia.  The patient does perform self breast exams.  There is no notable family history of breast or ovarian cancer in her family.  The patient wears seatbelts: yes.   The patient has regular exercise:  a little walking .    The patient reports current symptoms of depression.  Managed with Zoloft -Works with computers -Lives with her partner and child, feels safe  PCP: [place], will have labs next week  Dentist: up to date Eye exam needs   Review of Systems: ROS see HPI   Past Medical History:  Patient Active Problem List   Diagnosis Date Noted   Anxiety with depression 09/25/2022   Perennial allergic rhinitis 09/25/2022   GERD (gastroesophageal reflux disease) 09/25/2022   S/P ORIF (open reduction internal fixation) right lateral malleolar fracture 09/25/2022   Light tobacco smoker 09/25/2022   History of loop electrosurgical excision procedure (LEEP) 06/01/2021   Vitamin D insufficiency 08/11/2019   Chronic alcoholism in remission ([facility]) 05/30/2019    133 days sober  Notes drank [employer]; loves whisky.     Class 1 obesity due to excess calories with body mass index (BMI) of 33.0 to 33.9 in adult 05/30/2019   Spondylisthesis 05/27/2019    Lumbar region.   Chronic back pain     Hyperlipidemia  11/12/2017    Was told that she had hyperlipidemia at some point in the past    Menorrhagia with regular cycle 11/07/2017   Irritable bowel syndrome with diarrhea 11/16/2009    Diarhea and constipation;   since stopping her alcohol intake, her diarrhea has primarily become constipation now.    -Treated with daily magnesium citrate  Sees GI in [place]     Past Surgical History:  Past Surgical History:  Procedure Laterality Date   CESAREAN SECTION N/A 12/05/2021   Procedure: CESAREAN SECTION;  Surgeon: Natale Milch, MD;  Location: [facility];  Service: Obstetrics;  Laterality: N/A;   COLPOSCOPY     DILATION AND CURETTAGE OF UTERUS     INDUCED ABORTION     ORIF ANKLE FRACTURE Right 06/11/2022   Procedure: OPEN REDUCTION INTERNAL FIXATION (ORIF) ANKLE FRACTURE,  syndesmosis repair ,  with allograft;  Surgeon: Netta Cedars, MD;  Location: [facility];  Service: Orthopedics;  Laterality: Right;  60 min choice   SYNDESMOSIS REPAIR Right 06/11/2022   Procedure: SYNDESMOSIS REPAIR RIGHT ANKLE;  Surgeon: Netta Cedars, MD;  Location: [facility];  Service: Orthopedics;  Laterality: Right;   TONSILLECTOMY      Gynecologic History:  Patient's last menstrual period was 03/07/2021 (approximate). Contraception: oral progesterone-only contraceptive Last Pap: Results were: NIL and HR HPV negative 05/25/2021  Obstetric History: [id number]  Family History:  Family History  Problem Relation Age of Onset   Heart attack Maternal Grandfather  Leukemia Maternal Grandfather    Depression Mother    Hyperlipidemia Mother    Depression Maternal Grandmother    Hypertension Paternal Grandmother    Melanoma Paternal Grandfather     Social History:  Social History   Socioeconomic History   Marital status: Single    Spouse name: Not on file   Number of children: 0   Years of education: Bachelors   Highest education level: Not on file  Occupational  History   Not on file  Tobacco Use   Smoking status: Former    Packs/day: 0.25    Years: 14.00    Additional pack years: 0.00    Total pack years: 3.50    Types: Cigarettes    Start date: 08/20/2004    Quit date: 08/2019    Years since quitting: 3.4   Smokeless tobacco: Never  Vaping Use   Vaping Use: Never used  Substance and Sexual Activity   Alcohol use: Not Currently   Drug use: No   Sexual activity: Yes    Birth control/protection: Pill  Other Topics Concern   Not on file  Social History Narrative   Not on file   Social Determinants of Health   Financial Resource Strain: Not on file  Food Insecurity: Not on file  Transportation Needs: Not on file  Physical Activity: Not on file  Stress: Not on file  Social Connections: Not on file  Intimate Partner Violence: Not on file    Allergies:  Allergies  Allergen Reactions   Penicillins Hives    Medications: Prior to Admission medications   Medication Sig Start Date End Date Taking? Authorizing Provider  calcium carbonate (OSCAL) 1500 (600 Ca) MG TABS tablet Take 1,500 mg by mouth daily.   Yes [provider]  cholecalciferol (VITAMIN D3) 25 MCG (1000 UNIT) tablet Take 1,000 Units by mouth daily.   Yes [provider]  naphazoline-pheniramine (EYE ALLERGY RELIEF) 0.025-0.3 % ophthalmic solution 1 drop 4 (four) times daily as needed for eye irritation or allergies.   Yes [provider]  norethindrone (MICRONOR) 0.35 MG tablet TAKE 1 TABLET BY MOUTH EVERY DAY 01/21/23  Yes Dominica Severin, CNM  omeprazole (PRILOSEC) 20 MG capsule TAKE 1 CAPSULE BY MOUTH EVERY DAY 05/14/22  Yes Mayer Masker, PA-C  Prenatal Vit-Fe Fumarate-FA (PRENATAL MULTIVITAMIN) TABS tablet Take 1 tablet by mouth daily.   Yes [provider]  sertraline (ZOLOFT) 50 MG tablet Take 1 tablet (50 mg total) by mouth at bedtime. 09/25/22  Yes Loyola Mast, MD  cetirizine (ZYRTEC) 10 MG tablet Take 10 mg by mouth daily.     [provider]    Physical Exam Vitals: Blood pressure (!) 131/93, pulse 79, weight 195 lb 6.4 oz (88.6 kg), last menstrual period 03/07/2021, currently breastfeeding.  General: NAD HEENT: normocephalic, anicteric Thyroid: no enlargement, no palpable nodules Pulmonary: No increased work of breathing, CTAB Cardiovascular: RRR, distal pulses 2+ Breast: Breast symmetrical, no tenderness, no palpable nodules or masses, no skin or nipple retraction present, no nipple discharge.  No axillary or supraclavicular lymphadenopathy. Abdomen: NABS, soft, non-tender, non-distended.  Umbilicus without lesions.  No hepatomegaly, splenomegaly or masses palpable. No evidence of hernia  Genitourinary:  External: Normal external female genitalia.  Normal urethral meatus, normal Bartholin's and Skene's glands.    Vagina: Normal vaginal mucosa, no evidence of prolapse.  Good tone   Cervix: spec exam not done  Uterus: Non-enlarged, mobile, normal contour.  No CMT  Adnexa: ovaries non-enlarged,  no adnexal masses  Rectal: deferred  Lymphatic: no evidence of inguinal lymphadenopathy Extremities: no edema, erythema, or tenderness Neurologic: Grossly intact Psychiatric: mood appropriate, affect full   Assessment: 32 y.o. [id number] routine annual exam  Plan: Problem List Items Addressed This Visit   None Visit Diagnoses     Well woman exam    -  Primary   Encounter for surveillance of contraceptive pills       Relevant Medications   norethindrone (MICRONOR) 0.35 MG tablet       2) STI screening  wasoffered and declined  2)  ASCCP guidelines and rational discussed.  Patient opts for every 3 years screening interval due 2025  3) Contraception - the patient is currently using  oral progesterone-only contraceptive.  She is happy with her current form of contraception and plans to continue  4) Routine healthcare maintenance including cholesterol, diabetes screening discussed managed by  PCP  5) No follow-ups on file.  Carie Caddy, CNM   [facility] 01/25/2023, 2:43 PM

## 2023-01-28 ENCOUNTER — Ambulatory Visit (INDEPENDENT_AMBULATORY_CARE_PROVIDER_SITE_OTHER): Payer: 59 | Admitting: Family Medicine

## 2023-01-28 VITALS — BP 112/72 | HR 96 | Temp 97.8°F | Ht 65.0 in | Wt 195.2 lb

## 2023-01-28 DIAGNOSIS — F418 Other specified anxiety disorders: Secondary | ICD-10-CM | POA: Diagnosis not present

## 2023-01-28 DIAGNOSIS — Z0000 Encounter for general adult medical examination without abnormal findings: Secondary | ICD-10-CM

## 2023-01-28 DIAGNOSIS — E782 Mixed hyperlipidemia: Secondary | ICD-10-CM

## 2023-01-28 DIAGNOSIS — Z77011 Contact with and (suspected) exposure to lead: Secondary | ICD-10-CM | POA: Diagnosis not present

## 2023-01-28 NOTE — Assessment & Plan Note (Signed)
Stable.  Continue sertraline 50 mg daily.

## 2023-01-28 NOTE — Progress Notes (Signed)
[facility] PRIMARY CARE [facility] PRIMARY Trecia Rogers [facility] [address] [place] [address] Dept: [phone] Dept Fax: [phone]  Annual Physical Visit  Subjective:    Patient ID: April Harris, female    DOB: [birth year], 32 y.o..   MRN: [id number]  Chief Complaint  Patient presents with   Annual Exam    CPE/labs.  No concerns.  Fasting today.    History of Present Illness:  Patient is in today for an annual physical/preventative visit.  Overall, doing well. Her daughter has been found to have elevated lead levels. They live in a home built in the 1700s. There are a number of family heirlooms and antiques in the home, many of which had lead components or were constructed of lead. There is lead pain in the home. The water has tested clear. As April Harris is breastfeeding, she was advised to have lead testing herself.  Review of Systems  Constitutional:  Negative for chills, diaphoresis, fever, malaise/fatigue and weight loss.  HENT:  Negative for congestion, ear pain, hearing loss, sinus pain, sore throat and tinnitus.   Eyes:  Negative for blurred vision, pain, discharge and redness.  Respiratory:  Negative for cough, shortness of breath and wheezing.   Cardiovascular:  Negative for chest pain and palpitations.  Gastrointestinal:  Positive for heartburn. Negative for abdominal pain, constipation, diarrhea, nausea and vomiting.       Heartburn is controlled with omeprazole. Notes this flares if she misses any doses.  Musculoskeletal:  Negative for back pain, joint pain and myalgias.  Skin:  Negative for itching and rash.  Psychiatric/Behavioral:  Negative for depression. The patient is not nervous/anxious.      Past Medical History: Patient Active Problem List   Diagnosis Date Noted   Anxiety with depression 09/25/2022   Perennial allergic rhinitis 09/25/2022   GERD (gastroesophageal reflux disease) 09/25/2022   S/P ORIF (open reduction internal fixation)  right lateral malleolar fracture 09/25/2022   Light tobacco smoker 09/25/2022   History of loop electrosurgical excision procedure (LEEP) 06/01/2021   Vitamin D insufficiency 08/11/2019   Chronic alcoholism in remission (HCC) 05/30/2019   Class 1 obesity due to excess calories with body mass index (BMI) of 33.0 to 33.9 in adult 05/30/2019   Spondylisthesis 05/27/2019   Hyperlipidemia 11/12/2017   Menorrhagia with regular cycle 11/07/2017   Irritable bowel syndrome with diarrhea 11/16/2009   Past Surgical History:  Procedure Laterality Date   CESAREAN SECTION N/A 12/05/2021   Procedure: CESAREAN SECTION;  Surgeon: Natale Milch, MD;  Location: [facility]S;  Service: Obstetrics;  Laterality: N/A;   COLPOSCOPY     DILATION AND CURETTAGE OF UTERUS     INDUCED ABORTION     ORIF ANKLE FRACTURE Right 06/11/2022   Procedure: OPEN REDUCTION INTERNAL FIXATION (ORIF) ANKLE FRACTURE,  syndesmosis repair ,  with allograft;  Surgeon: Netta Cedars, MD;  Location: [facility];  Service: Orthopedics;  Laterality: Right;  60 min choice   SYNDESMOSIS REPAIR Right 06/11/2022   Procedure: SYNDESMOSIS REPAIR RIGHT ANKLE;  Surgeon: Netta Cedars, MD;  Location:  SURGERY CENTER;  Service: Orthopedics;  Laterality: Right;   TONSILLECTOMY     Family History  Problem Relation Age of Onset   Heart attack Maternal Grandfather    Leukemia Maternal Grandfather    Depression Mother    Hyperlipidemia Mother    Depression Maternal Grandmother    Hypertension Paternal Grandmother    Melanoma Paternal Grandfather    Outpatient Medications Prior to  Visit  Medication Sig Dispense Refill   calcium carbonate (OSCAL) 1500 (600 Ca) MG TABS tablet Take 1,500 mg by mouth daily.     cholecalciferol (VITAMIN D3) 25 MCG (1000 UNIT) tablet Take 1,000 Units by mouth daily.     levocetirizine (XYZAL) 5 MG tablet Take 5 mg by mouth every evening.     naphazoline-pheniramine (EYE ALLERGY  RELIEF) 0.025-0.3 % ophthalmic solution 1 drop 4 (four) times daily as needed for eye irritation or allergies.     norethindrone (MICRONOR) 0.35 MG tablet Take 1 tablet (0.35 mg total) by mouth daily. 28 tablet 12   omeprazole (PRILOSEC) 20 MG capsule TAKE 1 CAPSULE BY MOUTH EVERY DAY 90 capsule 0   Prenatal Vit-Fe Fumarate-FA (PRENATAL MULTIVITAMIN) TABS tablet Take 1 tablet by mouth daily.     sertraline (ZOLOFT) 50 MG tablet Take 1 tablet (50 mg total) by mouth at bedtime. 90 tablet 3   VOLTAREN 1 % GEL Apply 2 g topically 4 (four) times daily.     cetirizine (ZYRTEC) 10 MG tablet Take 10 mg by mouth daily.     No facility-administered medications prior to visit.   Allergies  Allergen Reactions   Penicillins Hives   Objective:   Today's Vitals   01/28/23 0812  BP: 112/72  Pulse: 96  Temp: 97.8 F (36.6 C)  TempSrc: Temporal  SpO2: 98%  Weight: 195 lb 3.2 oz (88.5 kg)  Height: 5\' 5"  (1.651 m)   Body mass index is 32.48 kg/m.   General: Well developed, well nourished. No acute distress. HEENT: Normocephalic, non-traumatic. PERRL, EOMI. Conjunctiva clear. External ears normal. EAC and   TMs normal bilaterally. Nose clear without congestion or rhinorrhea. Mucous membranes moist.   Oropharynx clear. Good dentition. Neck: Supple. No lymphadenopathy. No thyromegaly. Lungs: Clear to auscultation bilaterally. No wheezing, rales or rhonchi. CV: RRR without murmurs or rubs. Pulses 2+ bilaterally. Abdomen: Soft, non-tender. Bowel sounds positive, normal pitch and frequency. No hepatosplenomegaly.   No rebound or guarding. Extremities: Full ROM. No joint swelling or tenderness. No edema noted. Skin: Warm and dry. No rashes. Psych: Alert and oriented. Normal mood and affect.  There are no preventive care reminders to display for this patient.    Lab Results:    Latest Ref Rng & Units 10/02/2022    8:56 AM 12/06/2021    5:18 AM 12/04/2021   11:29 AM  CBC  WBC 4.0 - 10.5 K/uL 6.4   10.7  9.8   Hemoglobin 12.0 - 15.0 g/dL 86.5  9.8  78.4   Hematocrit 36.0 - 46.0 % 40.8  30.8  33.9   Platelets 150.0 - 400.0 K/uL 197.0  140  159    Lab Results  Component Value Date   CHOL 237 (H) 10/02/2022   HDL 40.20 10/02/2022   LDLCALC 166 (H) 10/02/2022   TRIG 152.0 (H) 10/02/2022   CHOLHDL 6 10/02/2022   Assessment & Plan:   Problem List Items Addressed This Visit       Other   Hyperlipidemia    Lipids are elevated. I recommend eating a heart-healthy diet (DASH diet or Mediterranean), getting 150 minutes of moderate-intensity exercise each week, maintaining a normal weight, and avoiding tobacco products.      Anxiety with depression    Stable. Continue  sertraline 50 mg daily.      Other Visit Diagnoses     Annual physical exam    -  Primary   Overall excellent health. Smokes 1 cigarette a  week. I advised her to consider stopping this.   Lead exposure       Family is instituting mitagation strategies. I will check her lead level today.   Relevant Orders   Lead, blood (adult age 76 yrs or greater)       Return in about 1 year (around 01/28/2024) for Annual preventative care.   Loyola Mast, MD

## 2023-01-28 NOTE — Assessment & Plan Note (Signed)
Lipids are elevated. I recommend eating a heart-healthy diet (DASH diet or Mediterranean), getting 150 minutes of moderate-intensity exercise each week, maintaining a normal weight, and avoiding tobacco products.

## 2023-01-30 LAB — LEAD, BLOOD (ADULT >= 16 YRS): Lead: <1 ug/dL (ref <3.5)

## 2023-02-03 ENCOUNTER — Encounter: Payer: Self-pay | Admitting: Licensed Practical Nurse

## 2023-03-22 ENCOUNTER — Telehealth: Payer: 59 | Admitting: Family Medicine

## 2023-03-22 DIAGNOSIS — J301 Allergic rhinitis due to pollen: Secondary | ICD-10-CM

## 2023-03-22 MED ORDER — FLUTICASONE PROPIONATE 50 MCG/ACT NA SUSP: 2.0000 | Freq: Every day | NASAL | 0 refills | Status: AC

## 2023-03-22 NOTE — Progress Notes (Signed)
E visit for Allergic Rhinitis We are sorry that you are not feeling well.  Here is how we plan to help!  You need to clear your nasal passages of the allergens. Your body had a strong reaction, but it will settle with consistent use of the below and nasal rinses.   Based on what you have shared with me it looks like you have Allergic Rhinitis.  Rhinitis is when a reaction occurs that causes nasal congestion, runny nose, sneezing, and itching.  Most types of rhinitis are caused by an inflammation and are associated with symptoms in the eyes ears or throat. There are several types of rhinitis.  The most common are acute rhinitis, which is usually caused by a viral illness, allergic or seasonal rhinitis, and nonallergic or year-round rhinitis.  Nasal allergies occur certain times of the year.  Allergic rhinitis is caused when allergens in the air trigger the release of histamine in the body.  Histamine causes itching, swelling, and fluid to build up in the fragile linings of the nasal passages, sinuses and eyelids.  An itchy nose and clear discharge are common.  I recommend the following over the counter treatments: You should take a daily dose of antihistamine use what you have been using  I also would recommend a nasal spray: Flonase 2 sprays into each nostril once daily and Saline 1 spray into each nostril as needed Add this.  You may also benefit from eye drops such as: Systane 1-2 driops each eye twice daily as needed or the eye drops you have.  HOME CARE:  You can use an over-the-counter saline nasal spray as needed Avoid areas where there is heavy dust, mites, or molds Stay indoors on windy days during the pollen season Keep windows closed in home, at least in bedroom; use air conditioner. Use high-efficiency house air filter Keep windows closed in car, turn AC on re-circulate Avoid playing out with dog during pollen season  GET HELP RIGHT AWAY IF:  If your symptoms do not improve  within 10 days You become short of breath You develop yellow or green discharge from your nose for over 3 days You have coughing fits  MAKE SURE YOU:  Understand these instructions Will watch your condition Will get help right away if you are not doing well or get worse  Thank you for choosing an e-visit. Your e-visit answers were reviewed by a board certified advanced clinical practitioner to complete your personal care plan. Depending upon the condition, your plan could have included both over the counter or prescription medications. Please review your pharmacy choice. Be sure that the pharmacy you have chosen is open so that you can pick up your prescription now.  If there is a problem you may message your provider in MyChart to have the prescription routed to another pharmacy. Your safety is important to [place]. If you have drug allergies check your prescription carefully.  For the next 24 hours, you can use MyChart to ask questions about today's visit, request a non-urgent call back, or ask for a work or school excuse from your e-visit provider. You will get an email in the next two days asking about your experience. I hope that your e-visit has been valuable and will speed your recovery.       I provided 5 minutes of non face-to-face time during this encounter for chart review, medication and order placement, as well as and documentation.

## 2023-05-30 ENCOUNTER — Telehealth: Payer: Self-pay

## 2023-05-30 NOTE — Telephone Encounter (Signed)
Pt calling; is having trouble getting norethindrone as her pharm doesn't have it anymore.  Please call.  [phone]  [facility]

## 2023-05-30 NOTE — Telephone Encounter (Signed)
Pt returned call.  States [facility] called her and told her rx was ready; when she got there they didn't have any record of it.  Pt asked what was in her chart; adv in 01/2023 one month c 12 refills was set in to [facility] [address] [place].  Pt is going to call [facility] back since the problem is not on our side.

## 2023-06-16 ENCOUNTER — Encounter: Payer: Self-pay | Admitting: Family Medicine

## 2023-06-17 ENCOUNTER — Ambulatory Visit (INDEPENDENT_AMBULATORY_CARE_PROVIDER_SITE_OTHER): Payer: 59 | Admitting: Family Medicine

## 2023-06-17 VITALS — BP 124/76 | HR 70 | Temp 98.7°F | Ht 65.0 in | Wt 195.2 lb

## 2023-06-17 DIAGNOSIS — M25531 Pain in right wrist: Secondary | ICD-10-CM | POA: Diagnosis not present

## 2023-06-17 MED ORDER — NAPROXEN 500 MG PO TABS: 500.0000 mg | ORAL_TABLET | Freq: Two times a day (BID) | ORAL | 0 refills | Status: DC

## 2023-06-17 NOTE — Progress Notes (Signed)
[facility] [address] [place] [address] Dept: [phone] Dept Fax: [phone]  Office Visit  Subjective:    Patient ID: April Harris, female    DOB: Feb 14, 1991, 32 y.o..   MRN: [id number]  Chief Complaint  Patient presents with   Wrist Pain    C/o having Rt wrist pain x 8 weeks.  Has taken Ibuprofen.    History of Present Illness:  Patient is in today with a 2 month history of right wrist pain. She recalls that when this first started hurting, she did nothing more than wring out a wash cloth. Here more recently, she lifted a heavy object, which irritated this more. She does use her hands a great deal with work and with home activities (e.g. crocheting). She doesn't recall any sudden trauma. She has been using a wrsit splint and applying Volataren gel.  Past Medical History: Patient Active Problem List   Diagnosis Date Noted   Anxiety with depression 09/25/2022   Perennial allergic rhinitis 09/25/2022   GERD (gastroesophageal reflux disease) 09/25/2022   S/P ORIF (open reduction internal fixation) right lateral malleolar fracture 09/25/2022   Light tobacco smoker 09/25/2022   History of loop electrosurgical excision procedure (LEEP) 06/01/2021   Vitamin D insufficiency 08/11/2019   Chronic alcoholism in remission (HCC) 05/30/2019   Class 1 obesity due to excess calories with body mass index (BMI) of 33.0 to 33.9 in adult 05/30/2019   Spondylolisthesis 05/27/2019   Hyperlipidemia 11/12/2017   Menorrhagia with regular cycle 11/07/2017   Irritable bowel syndrome with diarrhea 11/16/2009   Past Surgical History:  Procedure Laterality Date   CESAREAN SECTION N/A 12/05/2021   Procedure: CESAREAN SECTION;  Surgeon: Natale Milch, MD;  Location: [facility];  Service: Obstetrics;  Laterality: N/A;   COLPOSCOPY     DILATION AND CURETTAGE OF UTERUS     INDUCED ABORTION     ORIF ANKLE FRACTURE Right 06/11/2022    Procedure: OPEN REDUCTION INTERNAL FIXATION (ORIF) ANKLE FRACTURE,  syndesmosis repair ,  with allograft;  Surgeon: Netta Cedars, MD;  Location: [facility];  Service: Orthopedics;  Laterality: Right;  60 min choice   SYNDESMOSIS REPAIR Right 06/11/2022   Procedure: SYNDESMOSIS REPAIR RIGHT ANKLE;  Surgeon: Netta Cedars, MD;  Location: [facility];  Service: Orthopedics;  Laterality: Right;   TONSILLECTOMY     Family History  Problem Relation Age of Onset   Heart attack Maternal Grandfather    Leukemia Maternal Grandfather    Depression Mother    Hyperlipidemia Mother    Depression Maternal Grandmother    Hypertension Paternal Grandmother    Melanoma Paternal Grandfather    Outpatient Medications Prior to Visit  Medication Sig Dispense Refill   calcium carbonate (OSCAL) 1500 (600 Ca) MG TABS tablet Take 1,500 mg by mouth daily.     cholecalciferol (VITAMIN D3) 25 MCG (1000 UNIT) tablet Take 1,000 Units by mouth daily.     fluticasone (FLONASE) 50 MCG/ACT nasal spray Place 2 sprays into both nostrils daily. 16 g 0   levocetirizine (XYZAL) 5 MG tablet Take 5 mg by mouth every evening.     naphazoline-pheniramine (EYE ALLERGY RELIEF) 0.025-0.3 % ophthalmic solution 1 drop 4 (four) times daily as needed for eye irritation or allergies.     norethindrone (MICRONOR) 0.35 MG tablet Take 1 tablet (0.35 mg total) by mouth daily. 28 tablet 12   omeprazole (PRILOSEC) 20 MG capsule TAKE 1 CAPSULE BY MOUTH  EVERY DAY 90 capsule 0   Prenatal Vit-Fe Fumarate-FA (PRENATAL MULTIVITAMIN) TABS tablet Take 1 tablet by mouth daily.     sertraline (ZOLOFT) 50 MG tablet Take 1 tablet (50 mg total) by mouth at bedtime. 90 tablet 3   VOLTAREN 1 % GEL Apply 2 g topically 4 (four) times daily.     No facility-administered medications prior to visit.   Allergies  Allergen Reactions   Penicillins Hives     Objective:   Today's Vitals   06/17/23 1404  BP: 124/76   Pulse: 70  Temp: 98.7 F (37.1 C)  TempSrc: Temporal  SpO2: 99%  Weight: 195 lb 3.2 oz (88.5 kg)  Height: 5\' 5"  (1.651 m)   Body mass index is 32.48 kg/m.   General: Well developed, well nourished. No acute distress. Extremities: Full ROM, but with some pain with wrist extension and ulnar or radial deviation. Mild puffiness over the   dorsum of the wrist. Strength is good. No joint instability. Psych: Alert and oriented. Normal mood and affect.  Health Maintenance Due  Topic Date Due   INFLUENZA VACCINE  03/21/2023     Assessment & Plan:   Problem List Items Addressed This Visit       Other   Right wrist pain - Primary    Suspect an extensor tendinitis fo the wrist. Discussed relative rest, continue use of a wrist splint, heat with ROM, and a 7-day course of naproxen. If not improved in 1 week, will consider a sport's medicine referral.      Relevant Medications   naproxen (NAPROSYN) 500 MG tablet    Return in about 1 week (around 06/24/2023), or if symptoms worsen or fail to improve.   Loyola Mast, MD

## 2023-06-17 NOTE — Assessment & Plan Note (Signed)
Suspect an extensor tendinitis fo the wrist. Discussed relative rest, continue use of a wrist splint, heat with ROM, and a 7-day course of naproxen. If not improved in 1 week, will consider a sport's medicine referral.

## 2023-09-04 ENCOUNTER — Telehealth (INDEPENDENT_AMBULATORY_CARE_PROVIDER_SITE_OTHER): Payer: 59 | Admitting: Family Medicine

## 2023-09-04 ENCOUNTER — Encounter: Payer: Self-pay | Admitting: Family Medicine

## 2023-09-04 VITALS — Ht 65.0 in | Wt 185.0 lb

## 2023-09-04 DIAGNOSIS — F900 Attention-deficit hyperactivity disorder, predominantly inattentive type: Secondary | ICD-10-CM | POA: Diagnosis not present

## 2023-09-04 MED ORDER — AMPHETAMINE-DEXTROAMPHET ER 20 MG PO CP24: 20.0000 mg | ORAL_CAPSULE | ORAL | 0 refills | Status: DC

## 2023-09-04 NOTE — Progress Notes (Signed)
 [facility] [address] [place] [address] Dept: [phone] Dept Fax: [phone]  Virtual Video Visit  I connected with Molly Angers on 09/04/23 at  1:20 PM EST by a video enabled telemedicine application and verified that I am speaking with the correct person using two identifiers.  Location patient: Workplace Proofreader) Location provider: Clinic Persons participating in the virtual visit: Patient, Provider  I discussed the limitations of evaluation and management by telemedicine and the availability of in person appointments. The patient expressed understanding and agreed to proceed.  Chief Complaint  Patient presents with   Follow-up    Wants to discuss getting back on her ADHD meds.    SUBJECTIVE:  HPI: April Harris is a 33 y.o. female who presents to discuss treatment for ADHD. Ms. Capurso was identified as having adult ADHD in 2022. She notes that she often struggled with issues of focus in her adolescence. She had a brother with hyperactive-type ADHD that was treated as a teen. She feels that because she was not hyperactive, her family did not recognize her issues. Dr. Sharlene Dawn did have her completed an ASRS which was consistent with ADHD. She was managed on Adderall 10 mg twice daily. When she became pregnant, later that year, she went off of the medication. At this point, she continues to have issues with feeling overwhelmed and having poor focus at work.   Patient Active Problem List   Diagnosis Date Noted   Right wrist pain 06/17/2023   Anxiety with depression 09/25/2022   Perennial allergic rhinitis 09/25/2022   GERD (gastroesophageal reflux disease) 09/25/2022   S/P ORIF (open reduction internal fixation) right lateral malleolar fracture 09/25/2022   Light tobacco smoker 09/25/2022   History of loop electrosurgical excision procedure (LEEP) 06/01/2021   Vitamin D  insufficiency 08/11/2019   Chronic  alcoholism in remission (HCC) 05/30/2019   Class 1 obesity due to excess calories with body mass index (BMI) of 33.0 to 33.9 in adult 05/30/2019   Spondylolisthesis 05/27/2019   Hyperlipidemia 11/12/2017   Menorrhagia with regular cycle 11/07/2017   Irritable bowel syndrome with diarrhea 11/16/2009   Past Surgical History:  Procedure Laterality Date   CESAREAN SECTION N/A 12/05/2021   Procedure: CESAREAN SECTION;  Surgeon: Heron Lord, MD;  Location: [facility];  Service: Obstetrics;  Laterality: N/A;   COLPOSCOPY     DILATION AND CURETTAGE OF UTERUS     INDUCED ABORTION     ORIF ANKLE FRACTURE Right 06/11/2022   Procedure: OPEN REDUCTION INTERNAL FIXATION (ORIF) ANKLE FRACTURE,  syndesmosis repair ,  with allograft;  Surgeon: Ali Ink, MD;  Location: [facility];  Service: Orthopedics;  Laterality: Right;  60 min choice   SYNDESMOSIS REPAIR Right 06/11/2022   Procedure: SYNDESMOSIS REPAIR RIGHT ANKLE;  Surgeon: Ali Ink, MD;  Location: [facility];  Service: Orthopedics;  Laterality: Right;   TONSILLECTOMY     Family History  Problem Relation Age of Onset   Heart attack Maternal Grandfather    Leukemia Maternal Grandfather    Depression Mother    Hyperlipidemia Mother    Depression Maternal Grandmother    Hypertension Paternal Grandmother    Melanoma Paternal Grandfather    Social History   Tobacco Use   Smoking status: Former    Current packs/day: 0.00    Average packs/day: 0.3 packs/day for 15.0 years (3.8 ttl pk-yrs)    Types: Cigarettes    Start date: 08/20/2004  Quit date: 08/2019    Years since quitting: 4.0   Smokeless tobacco: Never  Vaping Use   Vaping status: Never Used  Substance Use Topics   Alcohol use: Not Currently   Drug use: No    Current Outpatient Medications:    amphetamine -dextroamphetamine  (ADDERALL XR) 20 MG 24 hr capsule, Take 1 capsule (20 mg total) by mouth every morning., Disp: 30  capsule, Rfl: 0   calcium carbonate (OSCAL) 1500 (600 Ca) MG TABS tablet, Take 1,500 mg by mouth daily., Disp: , Rfl:    cholecalciferol (VITAMIN D3) 25 MCG (1000 UNIT) tablet, Take 1,000 Units by mouth daily., Disp: , Rfl:    fluticasone  (FLONASE ) 50 MCG/ACT nasal spray, Place 2 sprays into both nostrils daily., Disp: 16 g, Rfl: 0   levocetirizine (XYZAL) 5 MG tablet, Take 5 mg by mouth every evening., Disp: , Rfl:    Multiple Vitamin (MULTIVITAMIN) tablet, Take 1 tablet by mouth daily., Disp: , Rfl:    naphazoline-pheniramine (EYE ALLERGY RELIEF) 0.025-0.3 % ophthalmic solution, 1 drop 4 (four) times daily as needed for eye irritation or allergies., Disp: , Rfl:    norethindrone  (MICRONOR ) 0.35 MG tablet, Take 1 tablet (0.35 mg total) by mouth daily., Disp: 28 tablet, Rfl: 12   omeprazole  (PRILOSEC) 20 MG capsule, TAKE 1 CAPSULE BY MOUTH EVERY DAY, Disp: 90 capsule, Rfl: 0   sertraline  (ZOLOFT ) 50 MG tablet, Take 1 tablet (50 mg total) by mouth at bedtime., Disp: 90 tablet, Rfl: 3   naproxen  (NAPROSYN ) 500 MG tablet, Take 1 tablet (500 mg total) by mouth 2 (two) times daily with a meal. (Patient not taking: Reported on 09/04/2023), Disp: 14 tablet, Rfl: 0 Allergies  Allergen Reactions   Penicillins Hives   ROS: See pertinent positives and negatives per HPI.  OBSERVATIONS/OBJECTIVE:  VITALS per patient if applicable: Today's Vitals   09/04/23 1310  Weight: 185 lb (83.9 kg)  Height: 5\' 5"  (1.651 m)   Body mass index is 30.79 kg/m.   GENERAL: Alert and oriented. Appears well and in no acute distress. PSYCH/NEURO: Pleasant and cooperative. No obvious depression or anxiety. Speech and thought processing grossly intact.  ASSESSMENT AND PLAN:  Problem List Items Addressed This Visit       Other   Attention deficit hyperactivity disorder (ADHD), predominantly inattentive type - Primary   It would be reasonable to try Ms. Horace back on Adderall. I would recommend going to a once daily  Adderall XR 20 mg. I will plan to see her in 6 weeks to reassess.      Relevant Medications   amphetamine -dextroamphetamine  (ADDERALL XR) 20 MG 24 hr capsule     I discussed the assessment and treatment plan with the patient. The patient was provided an opportunity to ask questions and all were answered. The patient agreed with the plan and demonstrated an understanding of the instructions.   The patient was advised to call back or seek an in-person evaluation if the symptoms worsen or if the condition fails to improve as anticipated.  No follow-ups on file.   Graig Lawyer, MD

## 2023-09-04 NOTE — Assessment & Plan Note (Signed)
 It would be reasonable to try April Harris back on Adderall. I would recommend going to a once daily Adderall XR 20 mg. I will plan to see her in 6 weeks to reassess.

## 2023-10-06 ENCOUNTER — Other Ambulatory Visit: Payer: Self-pay | Admitting: Family Medicine

## 2023-10-06 DIAGNOSIS — F418 Other specified anxiety disorders: Secondary | ICD-10-CM

## 2023-10-08 ENCOUNTER — Encounter: Payer: Self-pay | Admitting: Family Medicine

## 2023-10-08 ENCOUNTER — Ambulatory Visit: Payer: 59 | Admitting: Family Medicine

## 2023-10-08 VITALS — BP 124/80 | HR 66 | Temp 98.0°F | Ht 65.0 in | Wt 196.0 lb

## 2023-10-08 DIAGNOSIS — F900 Attention-deficit hyperactivity disorder, predominantly inattentive type: Secondary | ICD-10-CM | POA: Diagnosis not present

## 2023-10-08 MED ORDER — METHYLPHENIDATE HCL ER (OSM) 18 MG PO TBCR: 18.0000 mg | EXTENDED_RELEASE_TABLET | Freq: Every day | ORAL | 0 refills | Status: DC

## 2023-10-08 NOTE — Assessment & Plan Note (Signed)
 I recommend we try switching her Adderall to Concerta and see if this resolves her diarrhea issue. We did discuss other alternative approaches, such as atomoxetine, that we might try if she is not tolerating stimulants.

## 2023-10-08 NOTE — Progress Notes (Signed)
 [facility] [address] [place] [address] Dept: [phone] Dept Fax: [phone]  Chronic Care Office Visit  Subjective:    Patient ID: April Harris, female    DOB: [birth year], 33 y.o..   MRN: [id number]  Chief Complaint  Patient presents with   ADHD    1 month f/u ADHD.  Medication is causing diarrhea .   History of Present Illness:  Patient is in today for reassessment of chronic medical issues. April Harris has ADHD. She was identified as having adult ADHD in 2022. She often struggled with issues of focus in her adolescence. She had a brother with hyperactive-type ADHD that was treated as a teen. She feels that because she was not hyperactive, her family did not recognize her issues. Dr. Jiles Harold had her complete an ASRS which was consistent with ADHD. She was managed on Adderall 10 mg twice daily. When she became pregnant, later that year, she went off of the medication. About a month ago, we restarted her therapy. I had her on Adderall XR 20 mg daily. [name] did find this was initially helpful, but then she had a tapering of effect. After the first week, she developed diarrhea, esp. occurring at night. She is finding this to be a bit intolerable. She does have a history of IBS, so this may be related.  Past Medical History: Patient Active Problem List   Diagnosis Date Noted   Attention deficit hyperactivity disorder (ADHD), predominantly inattentive type 09/04/2023   Right wrist pain 06/17/2023   Anxiety with depression 09/25/2022   Perennial allergic rhinitis 09/25/2022   GERD (gastroesophageal reflux disease) 09/25/2022   S/P ORIF (open reduction internal fixation) right lateral malleolar fracture 09/25/2022   Light tobacco smoker 09/25/2022   History of loop electrosurgical excision procedure (LEEP) 06/01/2021   Vitamin D insufficiency 08/11/2019   Chronic alcoholism in remission (HCC) 05/30/2019   Class 1  obesity due to excess calories with body mass index (BMI) of 33.0 to 33.9 in adult 05/30/2019   Spondylolisthesis 05/27/2019   Hyperlipidemia 11/12/2017   Menorrhagia with regular cycle 11/07/2017   Irritable bowel syndrome with diarrhea 11/16/2009   Past Surgical History:  Procedure Laterality Date   CESAREAN SECTION N/A 12/05/2021   Procedure: CESAREAN SECTION;  Surgeon: Natale Milch, MD;  Location: [facility];  Service: Obstetrics;  Laterality: N/A;   COLPOSCOPY     DILATION AND CURETTAGE OF UTERUS     INDUCED ABORTION     ORIF ANKLE FRACTURE Right 06/11/2022   Procedure: OPEN REDUCTION INTERNAL FIXATION (ORIF) ANKLE FRACTURE,  syndesmosis repair ,  with allograft;  Surgeon: Netta Cedars, MD;  Location: [facility];  Service: Orthopedics;  Laterality: Right;  60 min choice   SYNDESMOSIS REPAIR Right 06/11/2022   Procedure: SYNDESMOSIS REPAIR RIGHT ANKLE;  Surgeon: Netta Cedars, MD;  Location: [facility];  Service: Orthopedics;  Laterality: Right;   TONSILLECTOMY     Family History  Problem Relation Age of Onset   Heart attack Maternal Grandfather    Leukemia Maternal Grandfather    Depression Mother    Hyperlipidemia Mother    Depression Maternal Grandmother    Hypertension Paternal Grandmother    Melanoma Paternal Grandfather    Outpatient Medications Prior to Visit  Medication Sig Dispense Refill   calcium carbonate (OSCAL) 1500 (600 Ca) MG TABS tablet Take 1,500 mg by mouth daily.     cholecalciferol (VITAMIN D3) 25 MCG (1000 UNIT)  tablet Take 1,000 Units by mouth daily.     fluticasone (FLONASE) 50 MCG/ACT nasal spray Place 2 sprays into both nostrils daily. 16 g 0   levocetirizine (XYZAL) 5 MG tablet Take 5 mg by mouth every evening.     Multiple Vitamin (MULTIVITAMIN) tablet Take 1 tablet by mouth daily.     naphazoline-pheniramine (EYE ALLERGY RELIEF) 0.025-0.3 % ophthalmic solution 1 drop 4 (four) times daily as needed for  eye irritation or allergies.     naproxen (NAPROSYN) 500 MG tablet Take 1 tablet (500 mg total) by mouth 2 (two) times daily with a meal. 14 tablet 0   norethindrone (MICRONOR) 0.35 MG tablet Take 1 tablet (0.35 mg total) by mouth daily. 28 tablet 12   omeprazole (PRILOSEC) 20 MG capsule TAKE 1 CAPSULE BY MOUTH EVERY DAY 90 capsule 0   sertraline (ZOLOFT) 50 MG tablet TAKE 1 TABLET BY MOUTH EVERYDAY AT BEDTIME 90 tablet 3   amphetamine-dextroamphetamine (ADDERALL XR) 20 MG 24 hr capsule Take 1 capsule (20 mg total) by mouth every morning. 30 capsule 0   No facility-administered medications prior to visit.   Allergies  Allergen Reactions   Penicillins Hives   Adderall Xr [Amphetamine-Dextroamphet Er] Diarrhea   Objective:   Today's Vitals   10/08/23 1603  BP: 124/80  Pulse: 66  Temp: 98 F (36.7 C)  TempSrc: Temporal  SpO2: 97%  Weight: 196 lb (88.9 kg)  Height: 5\' 5"  (1.651 m)   Body mass index is 32.62 kg/m.   General: Well developed, well nourished. No acute distress. Psych: Alert and oriented. Normal mood and affect.  Health Maintenance Due  Topic Date Due   Pneumococcal Vaccine 75-24 Years old (1 of 2 - PCV) Never done     Assessment & Plan:   Problem List Items Addressed This Visit       Other   Attention deficit hyperactivity disorder (ADHD), predominantly inattentive type - Primary   I recommend we try switching her Adderall to Concerta and see if this resolves her diarrhea issue. We did discuss other alternative approaches, such as atomoxetine, that we might try if she is not tolerating stimulants.      Relevant Medications   methylphenidate (CONCERTA) 18 MG PO CR tablet    Return in about 6 weeks (around 11/19/2023) for Reassessment.   Loyola Mast, MD

## 2023-10-14 ENCOUNTER — Ambulatory Visit: Payer: 59 | Admitting: Family Medicine

## 2023-11-05 ENCOUNTER — Encounter: Payer: Self-pay | Admitting: Family Medicine

## 2023-11-05 DIAGNOSIS — F900 Attention-deficit hyperactivity disorder, predominantly inattentive type: Secondary | ICD-10-CM

## 2023-11-06 MED ORDER — LISDEXAMFETAMINE DIMESYLATE 20 MG PO CAPS: 20.0000 mg | ORAL_CAPSULE | Freq: Every day | ORAL | 0 refills | Status: DC

## 2023-11-08 MED ORDER — AMPHETAMINE-DEXTROAMPHETAMINE 10 MG PO TABS: 10.0000 mg | ORAL_TABLET | Freq: Every day | ORAL | 0 refills | Status: DC

## 2023-11-08 MED ORDER — AMPHETAMINE-DEXTROAMPHET ER 20 MG PO CP24: 20.0000 mg | ORAL_CAPSULE | Freq: Every day | ORAL | 0 refills | Status: DC

## 2023-11-08 NOTE — Addendum Note (Signed)
 Addended by: Loyola Mast on: 11/08/2023 08:17 AM   Modules accepted: Orders

## 2023-11-19 ENCOUNTER — Ambulatory Visit: Payer: 59 | Admitting: Family Medicine

## 2023-12-04 ENCOUNTER — Ambulatory Visit (INDEPENDENT_AMBULATORY_CARE_PROVIDER_SITE_OTHER): Admitting: Family Medicine

## 2023-12-04 DIAGNOSIS — F900 Attention-deficit hyperactivity disorder, predominantly inattentive type: Secondary | ICD-10-CM

## 2023-12-04 MED ORDER — AMPHETAMINE-DEXTROAMPHET ER 20 MG PO CP24: 20.0000 mg | ORAL_CAPSULE | Freq: Every day | ORAL | 0 refills | Status: DC

## 2023-12-04 MED ORDER — AMPHETAMINE-DEXTROAMPHETAMINE 10 MG PO TABS: 10.0000 mg | ORAL_TABLET | Freq: Every day | ORAL | 0 refills | Status: DC

## 2023-12-04 NOTE — Progress Notes (Signed)
 [facility] [address] [place] [address] Dept: [phone] Dept Fax: [phone]  Chronic Care Office Visit  Subjective:    Patient ID: April Harris, female    DOB: [birth year], 33 y.o..   MRN: [id number]  Chief Complaint  Patient presents with   ADHD    6 week f/u ADHD.  No concerns.    History of Present Illness:  Patient is in today for reassessment of chronic medical issues.  Ms. Marut has ADHD. She was identified as having adult ADHD in 2022. She often struggled with issues of focus in her adolescence. She is managed on Adderall XL 20 mg daily and Adderall 10 mg at 1:00 pm. At her last visit, we tried switching her to Concerta, but this was not as effective. I then prescribed Vyvanse, but this was too expensive. She is back to her Adderall currently. She feels she is tolerating this. She notes that if she misses her 1 pm time for taking her IR dose, she then skips that dose. When she has taken it later, it does interfere with sleep.  Past Medical History: Patient Active Problem List   Diagnosis Date Noted   Attention deficit hyperactivity disorder (ADHD), predominantly inattentive type 09/04/2023   Right wrist pain 06/17/2023   Anxiety with depression 09/25/2022   Perennial allergic rhinitis 09/25/2022   GERD (gastroesophageal reflux disease) 09/25/2022   S/P ORIF (open reduction internal fixation) right lateral malleolar fracture 09/25/2022   Light tobacco smoker 09/25/2022   History of loop electrosurgical excision procedure (LEEP) 06/01/2021   Vitamin D insufficiency 08/11/2019   Chronic alcoholism in remission (HCC) 05/30/2019   Class 1 obesity due to excess calories with body mass index (BMI) of 33.0 to 33.9 in adult 05/30/2019   Spondylolisthesis 05/27/2019   Hyperlipidemia 11/12/2017   Menorrhagia with regular cycle 11/07/2017   Irritable bowel syndrome with diarrhea 11/16/2009   Past Surgical  History:  Procedure Laterality Date   CESAREAN SECTION N/A 12/05/2021   Procedure: CESAREAN SECTION;  Surgeon: Natale Milch, MD;  Location: [facility];  Service: Obstetrics;  Laterality: N/A;   COLPOSCOPY     DILATION AND CURETTAGE OF UTERUS     INDUCED ABORTION     ORIF ANKLE FRACTURE Right 06/11/2022   Procedure: OPEN REDUCTION INTERNAL FIXATION (ORIF) ANKLE FRACTURE,  syndesmosis repair ,  with allograft;  Surgeon: Netta Cedars, MD;  Location: [facility];  Service: Orthopedics;  Laterality: Right;  60 min choice   SYNDESMOSIS REPAIR Right 06/11/2022   Procedure: SYNDESMOSIS REPAIR RIGHT ANKLE;  Surgeon: Netta Cedars, MD;  Location: [facility];  Service: Orthopedics;  Laterality: Right;   TONSILLECTOMY     Family History  Problem Relation Age of Onset   Heart attack Maternal Grandfather    Leukemia Maternal Grandfather    Depression Mother    Hyperlipidemia Mother    Depression Maternal Grandmother    Hypertension Paternal Grandmother    Melanoma Paternal Grandfather    Outpatient Medications Prior to Visit  Medication Sig Dispense Refill   calcium carbonate (OSCAL) 1500 (600 Ca) MG TABS tablet Take 1,500 mg by mouth daily.     cholecalciferol (VITAMIN D3) 25 MCG (1000 UNIT) tablet Take 1,000 Units by mouth daily.     fluticasone (FLONASE) 50 MCG/ACT nasal spray Place 2 sprays into both nostrils daily. 16 g 0   levocetirizine (XYZAL) 5 MG tablet Take 5 mg by mouth every evening.  Multiple Vitamin (MULTIVITAMIN) tablet Take 1 tablet by mouth daily.     naphazoline-pheniramine (EYE ALLERGY RELIEF) 0.025-0.3 % ophthalmic solution 1 drop 4 (four) times daily as needed for eye irritation or allergies.     norethindrone (MICRONOR) 0.35 MG tablet Take 1 tablet (0.35 mg total) by mouth daily. 28 tablet 12   omeprazole (PRILOSEC) 20 MG capsule TAKE 1 CAPSULE BY MOUTH EVERY DAY 90 capsule 0   sertraline (ZOLOFT) 50 MG tablet TAKE 1 TABLET  BY MOUTH EVERYDAY AT BEDTIME 90 tablet 3   amphetamine-dextroamphetamine (ADDERALL XR) 20 MG 24 hr capsule Take 1 capsule (20 mg total) by mouth daily. 30 capsule 0   amphetamine-dextroamphetamine (ADDERALL) 10 MG tablet Take 1 tablet (10 mg total) by mouth daily at 2 PM. 30 tablet 0   naproxen (NAPROSYN) 500 MG tablet Take 1 tablet (500 mg total) by mouth 2 (two) times daily with a meal. 14 tablet 0   No facility-administered medications prior to visit.   Allergies  Allergen Reactions   Penicillins Hives   Adderall Xr [Amphetamine-Dextroamphet Er] Diarrhea   Objective:   Today's Vitals   12/04/23 1525  BP: 118/74  Pulse: 81  Temp: 98.1 F (36.7 C)  TempSrc: Temporal  SpO2: 99%  Weight: 188 lb 3.2 oz (85.4 kg)  Height: 5\' 5"  (1.651 m)   Body mass index is 31.32 kg/m.   General: Well developed, well nourished. No acute distress. Psych: Alert and oriented. Normal mood and affect.  Health Maintenance Due  Topic Date Due   Pneumococcal Vaccine 71-81 Years old (1 of 2 - PCV) Never done     Assessment & Plan:   Problem List Items Addressed This Visit       Other   Attention deficit hyperactivity disorder (ADHD), predominantly inattentive type   Stable. We will continue Adderall XR 20 mg q am and Adderall 10 mg q pm.      Relevant Medications   amphetamine-dextroamphetamine (ADDERALL XR) 20 MG 24 hr capsule   amphetamine-dextroamphetamine (ADDERALL) 10 MG tablet    Return in about 6 months (around 06/04/2024) for Reassessment.   Graig Lawyer, MD

## 2023-12-04 NOTE — Assessment & Plan Note (Signed)
 Stable. We will continue Adderall XR 20 mg q am and Adderall 10 mg q pm.

## 2024-01-21 ENCOUNTER — Encounter: Payer: Self-pay | Admitting: Family Medicine

## 2024-01-21 DIAGNOSIS — F900 Attention-deficit hyperactivity disorder, predominantly inattentive type: Secondary | ICD-10-CM

## 2024-01-21 MED ORDER — AMPHETAMINE-DEXTROAMPHETAMINE 10 MG PO TABS: 10.0000 mg | ORAL_TABLET | Freq: Every day | ORAL | 0 refills | Status: DC

## 2024-01-21 MED ORDER — AMPHETAMINE-DEXTROAMPHET ER 20 MG PO CP24: 20.0000 mg | ORAL_CAPSULE | Freq: Every day | ORAL | 0 refills | Status: DC

## 2024-01-21 NOTE — Telephone Encounter (Signed)
 Refill request for Adderall 10 mg LR  12/04/23, #30, 0 rf  Adderall XR 20 mg LR  12/04/23, #30, 0 rf LOV  12/04/23 FOV  6/13/252  Please review and advise.  Thanks. Dm/cma

## 2024-01-31 ENCOUNTER — Encounter: Payer: Self-pay | Admitting: Family Medicine

## 2024-01-31 ENCOUNTER — Ambulatory Visit (INDEPENDENT_AMBULATORY_CARE_PROVIDER_SITE_OTHER): Payer: 59 | Admitting: Family Medicine

## 2024-01-31 VITALS — BP 118/76 | HR 88 | Temp 97.3°F | Ht 65.0 in | Wt 184.4 lb

## 2024-01-31 DIAGNOSIS — Z0000 Encounter for general adult medical examination without abnormal findings: Secondary | ICD-10-CM

## 2024-01-31 DIAGNOSIS — E782 Mixed hyperlipidemia: Secondary | ICD-10-CM

## 2024-01-31 NOTE — Assessment & Plan Note (Signed)
 I will order fasting lipids. Previously working on lifestyle approaches.

## 2024-01-31 NOTE — Assessment & Plan Note (Signed)
 Overall health is good. Recommend regular exercise. Discussed recommended screenings and immunizations.

## 2024-01-31 NOTE — Progress Notes (Signed)
 [facility] [address] [place] [address] Dept: [phone] Dept Fax: [phone]  Annual Physical Visit  Subjective:    Patient ID: April Harris, female    DOB: 11/19/1990, 33 y.o..   MRN: [id number]  Chief Complaint  Patient presents with   Annual Exam    CPE/labs.  No concerns.     History of Present Illness:  Patient is in today for an annual physical/preventative visit.  Review of Systems  Constitutional:  Negative for chills, diaphoresis, fever, malaise/fatigue and weight loss.  HENT:  Negative for congestion, ear pain, hearing loss, sinus pain, sore throat and tinnitus.   Eyes:  Negative for blurred vision, pain, discharge and redness.  Respiratory:  Negative for cough, shortness of breath and wheezing.   Cardiovascular:  Negative for chest pain and palpitations.  Gastrointestinal:  Positive for diarrhea, heartburn, nausea and vomiting. Negative for abdominal pain and constipation.       Occasional heartburn. Manages with avoidance of triggering foods. Uses Tums on occasion. Has some intermittent to rare episode of vomiting, such as when she brushes her teeth. Also occasional loose stools.  Musculoskeletal:  Negative for back pain, joint pain and myalgias.  Skin:  Negative for itching and rash.  Psychiatric/Behavioral:  Negative for depression. The patient is not nervous/anxious.    Past Medical History: Patient Active Problem List   Diagnosis Date Noted   Attention deficit hyperactivity disorder (ADHD), predominantly inattentive type 09/04/2023   Right wrist pain 06/17/2023   Anxiety with depression 09/25/2022   Perennial allergic rhinitis 09/25/2022   GERD (gastroesophageal reflux disease) 09/25/2022   S/P ORIF (open reduction internal fixation) right lateral malleolar fracture 09/25/2022   Light tobacco smoker 09/25/2022   History of loop electrosurgical excision procedure (LEEP) 06/01/2021    Vitamin D  insufficiency 08/11/2019   Chronic alcoholism in remission (HCC) 05/30/2019   Class 1 obesity due to excess calories with body mass index (BMI) of 33.0 to 33.9 in adult 05/30/2019   Spondylolisthesis 05/27/2019   Hyperlipidemia 11/12/2017   Menorrhagia with regular cycle 11/07/2017   Irritable bowel syndrome with diarrhea 11/16/2009   Past Surgical History:  Procedure Laterality Date   CESAREAN SECTION N/A 12/05/2021   Procedure: CESAREAN SECTION;  Surgeon: Heron Lord, MD;  Location: [facility];  Service: Obstetrics;  Laterality: N/A;   COLPOSCOPY     DILATION AND CURETTAGE OF UTERUS     FRACTURE SURGERY  06/11/22   INDUCED ABORTION     ORIF ANKLE FRACTURE Right 06/11/2022   Procedure: OPEN REDUCTION INTERNAL FIXATION (ORIF) ANKLE FRACTURE,  syndesmosis repair ,  with allograft;  Surgeon: Ali Ink, MD;  Location: [facility];  Service: Orthopedics;  Laterality: Right;  60 min choice   SYNDESMOSIS REPAIR Right 06/11/2022   Procedure: SYNDESMOSIS REPAIR RIGHT ANKLE;  Surgeon: Ali Ink, MD;  Location: [facility];  Service: Orthopedics;  Laterality: Right;   TONSILLECTOMY     Family History  Problem Relation Age of Onset   Heart attack Maternal Grandfather    Leukemia Maternal Grandfather    Depression Mother    Hyperlipidemia Mother    Asthma Mother    Depression Maternal Grandmother    Hypertension Paternal Grandmother    Melanoma Paternal Grandfather    ADD / ADHD Brother    Outpatient Medications Prior to Visit  Medication Sig Dispense Refill   amphetamine -dextroamphetamine  (ADDERALL XR) 20 MG 24 hr capsule Take 1 capsule (20 mg  total) by mouth daily. 30 capsule 0   amphetamine -dextroamphetamine  (ADDERALL) 10 MG tablet Take 1 tablet (10 mg total) by mouth daily at 2 PM. 30 tablet 0   calcium carbonate (OSCAL) 1500 (600 Ca) MG TABS tablet Take 1,500 mg by mouth daily.     cholecalciferol (VITAMIN D3) 25 MCG  (1000 UNIT) tablet Take 1,000 Units by mouth daily.     fluticasone  (FLONASE ) 50 MCG/ACT nasal spray Place 2 sprays into both nostrils daily. 16 g 0   levocetirizine (XYZAL) 5 MG tablet Take 5 mg by mouth every evening.     Multiple Vitamin (MULTIVITAMIN) tablet Take 1 tablet by mouth daily.     naphazoline-pheniramine (EYE ALLERGY RELIEF) 0.025-0.3 % ophthalmic solution 1 drop 4 (four) times daily as needed for eye irritation or allergies.     norethindrone  (MICRONOR ) 0.35 MG tablet Take 1 tablet (0.35 mg total) by mouth daily. 28 tablet 12   omeprazole  (PRILOSEC) 20 MG capsule TAKE 1 CAPSULE BY MOUTH EVERY DAY 90 capsule 0   sertraline  (ZOLOFT ) 50 MG tablet TAKE 1 TABLET BY MOUTH EVERYDAY AT BEDTIME 90 tablet 3   No facility-administered medications prior to visit.   Allergies  Allergen Reactions   Penicillins Hives   Adderall Xr [Amphetamine -Dextroamphet Er] Diarrhea   Objective:   Today's Vitals   01/31/24 1336  BP: 118/76  Pulse: 88  Temp: (!) 97.3 F (36.3 C)  TempSrc: Temporal  SpO2: 97%  Weight: 184 lb 6.4 oz (83.6 kg)  Height: 5' 5 (1.651 m)   Body mass index is 30.69 kg/m.   General: Well developed, well nourished. No acute distress. HEENT: Normocephalic, non-traumatic. PERRL, EOMI. Conjunctiva clear. External ears   normal. EAC and TMs normal bilaterally. Nose clear without congestion or rhinorrhea. Mucous   membranes moist. Oropharynx clear. Good dentition. Neck: Supple. No lymphadenopathy. No thyromegaly. Lungs: Clear to auscultation bilaterally. No wheezing, rales or rhonchi. CV: RRR without murmurs or rubs. Pulses 2+ bilaterally. Abdomen: Soft, non-tender. Bowel sounds positive, normal pitch and frequency. No   hepatosplenomegaly. No rebound or guarding. Extremities: Full ROM. No joint swelling or tenderness. No edema noted. Skin: Warm and dry. Mild fine scaliness along nasolabial folds.  Psych: Alert and oriented. Normal mood and affect.  Health  Maintenance Due  Topic Date Due   Pneumococcal Vaccine 72-100 Years old (1 of 2 - PCV) Never done     Assessment & Plan:   Problem List Items Addressed This Visit       Other   Annual physical exam - Primary   Overall health is good. Recommend regular exercise. Discussed recommended screenings and immunizations.       Hyperlipidemia   I will order fasting lipids. Previously working on lifestyle approaches.      Relevant Orders   Lipid panel    Return in about 6 months (around 08/01/2024) for Reassessment.   Graig Lawyer, MD

## 2024-02-03 ENCOUNTER — Ambulatory Visit: Payer: Self-pay | Admitting: Family Medicine

## 2024-02-03 ENCOUNTER — Other Ambulatory Visit (INDEPENDENT_AMBULATORY_CARE_PROVIDER_SITE_OTHER)

## 2024-02-03 DIAGNOSIS — E782 Mixed hyperlipidemia: Secondary | ICD-10-CM

## 2024-02-03 LAB — LIPID PANEL
Cholesterol: 207 mg/dL — ABNORMAL HIGH (ref 0–200)
HDL: 42.5 mg/dL (ref >39.00)
NonHDL: 164.6
Total CHOL/HDL Ratio: 5
Triglycerides: 403 mg/dL — ABNORMAL HIGH (ref 0.0–149.0)
VLDL: 80.6 mg/dL — ABNORMAL HIGH (ref 0.0–40.0)

## 2024-02-03 LAB — LDL CHOLESTEROL, DIRECT: Direct LDL: 139 mg/dL

## 2024-02-21 ENCOUNTER — Other Ambulatory Visit: Payer: Self-pay | Admitting: Licensed Practical Nurse

## 2024-02-21 DIAGNOSIS — Z3041 Encounter for surveillance of contraceptive pills: Secondary | ICD-10-CM

## 2024-02-25 ENCOUNTER — Other Ambulatory Visit: Payer: Self-pay

## 2024-02-25 DIAGNOSIS — Z3041 Encounter for surveillance of contraceptive pills: Secondary | ICD-10-CM

## 2024-02-25 MED ORDER — NORETHINDRONE 0.35 MG PO TABS: 1.0000 | ORAL_TABLET | Freq: Every day | ORAL | 0 refills | Status: DC

## 2024-03-03 ENCOUNTER — Other Ambulatory Visit (HOSPITAL_COMMUNITY)
Admission: RE | Admit: 2024-03-03 | Discharge: 2024-03-03 | Disposition: A | Discharge status: Home / Self Care | Source: Ambulatory Visit | Attending: Certified Nurse Midwife | Admitting: Certified Nurse Midwife

## 2024-03-03 ENCOUNTER — Ambulatory Visit: Admitting: Certified Nurse Midwife

## 2024-03-03 ENCOUNTER — Encounter: Payer: Self-pay | Admitting: Certified Nurse Midwife

## 2024-03-03 VITALS — BP 138/89 | HR 71 | Ht 65.0 in | Wt 187.5 lb

## 2024-03-03 DIAGNOSIS — Z01419 Encounter for gynecological examination (general) (routine) without abnormal findings: Secondary | ICD-10-CM | POA: Diagnosis not present

## 2024-03-03 DIAGNOSIS — Z3041 Encounter for surveillance of contraceptive pills: Secondary | ICD-10-CM | POA: Diagnosis not present

## 2024-03-03 DIAGNOSIS — Z1331 Encounter for screening for depression: Secondary | ICD-10-CM

## 2024-03-03 DIAGNOSIS — Z124 Encounter for screening for malignant neoplasm of cervix: Secondary | ICD-10-CM | POA: Insufficient documentation

## 2024-03-03 DIAGNOSIS — Z1151 Encounter for screening for human papillomavirus (HPV): Secondary | ICD-10-CM | POA: Insufficient documentation

## 2024-03-03 DIAGNOSIS — Z1272 Encounter for screening for malignant neoplasm of vagina: Secondary | ICD-10-CM | POA: Diagnosis not present

## 2024-03-03 MED ORDER — NORETHINDRONE 0.35 MG PO TABS: 1.0000 | ORAL_TABLET | Freq: Every day | ORAL | 4 refills | Status: AC

## 2024-03-03 NOTE — Patient Instructions (Signed)

## 2024-03-03 NOTE — Progress Notes (Unsigned)
 ANNUAL EXAM Patient name: April Harris MRN [id number]  Date of birth: [birth year] Chief Complaint:   Gynecologic Exam  History of Present Illness:   April Harris is a 33 y.o. [phone] {race:25618} female being seen today for a routine annual exam.  Current complaints: ***  Patient's last menstrual period was 02/17/2024 (approximate).   The pregnancy intention screening data noted above was reviewed. Potential methods of contraception were discussed. The patient elected to proceed with No data recorded.      Component Value Date/Time   DIAGPAP  05/25/2021 1617    - Negative for intraepithelial lesion or malignancy (NILM)   DIAGPAP - Low grade squamous intraepithelial lesion (LSIL) (A) 04/04/2020 1758   DIAGPAP  10/06/2018 0000    NEGATIVE FOR INTRAEPITHELIAL LESIONS OR MALIGNANCY.   HPVHIGH Negative 05/25/2021 1617   HPVHIGH Positive (A) 04/04/2020 1758   ADEQPAP  05/25/2021 1617    Satisfactory for evaluation; transformation zone component PRESENT.   ADEQPAP  04/04/2020 1758    Satisfactory for evaluation; transformation zone component PRESENT.   ADEQPAP  10/06/2018 0000    Satisfactory for evaluation  endocervical/transformation zone component PRESENT.       Last pap ***. Results were: {Pap findings:25134}. H/O abnormal pap: {yes/yes***/no:23866} Last mammogram: ***. Results were: {normal, abnormal, n/a:23837}. Family h/o breast cancer: {yes***/no:23838} Last colonoscopy: ***. Results were: {normal, abnormal, n/a:23837}. Family h/o colorectal cancer: {yes***/no:23838}     03/03/2024    2:20 PM 01/31/2024    1:43 PM 12/04/2023    3:34 PM 09/04/2023    1:16 PM 01/28/2023    8:22 AM  Depression screen PHQ 2/9  Decreased Interest 0 0 0 0 0  Down, Depressed, Hopeless 0 0 0 1 1  PHQ - 2 Score 0 0 0 1 1  Altered sleeping  2 1 3 2   Tired, decreased energy  2 0 3 1  Change in appetite  0 1 0 0  Feeling bad or failure about yourself   0 0 0 0  Trouble concentrating  0  0 1 0  Moving slowly or fidgety/restless  0 1 2 0  Suicidal thoughts  0 0 0 0  PHQ-9 Score  4 3 10 4   Difficult doing work/chores  Not difficult at all Not difficult at all Somewhat difficult Not difficult at all        01/31/2024    1:44 PM 09/25/2022    3:50 PM 07/06/2021    4:16 PM 08/11/2019    9:33 AM  GAD 7 : Generalized Anxiety Score  Nervous, Anxious, on Edge 1 2 1  0  Control/stop worrying 0 1 0 0  Worry too much - different things 0 1 0 0  Trouble relaxing 0 1 1 0  Restless 0 1 0 1  Easily annoyed or irritable 0 2 0 0  Afraid - awful might happen 0 0 0 0  Total GAD 7 Score 1 8 2 1   Anxiety Difficulty Not difficult at all Somewhat difficult Not difficult at all Not difficult at all     Review of Systems:   Pertinent items are noted in HPI Denies any headaches, blurred vision, fatigue, shortness of breath, chest pain, abdominal pain, abnormal vaginal discharge/itching/odor/irritation, problems with periods, bowel movements, urination, or intercourse unless otherwise stated above. Pertinent History Reviewed:  Reviewed past medical,surgical, social and family history.  Reviewed problem list, medications and allergies. Physical Assessment:   Vitals:   03/03/24 1415  BP: (!) 121/102  Pulse:  77  Weight: 187 lb 8 oz (85 kg)  Height: 5' 5 (1.651 m)  Body mass index is 31.2 kg/m.       Physical Exam   No results found for this or any previous visit (from the past 24 hours).  Assessment & Plan:  1. Well woman exam (Primary)  2. Cervical cancer screening  3. Encounter for screening for human papillomavirus (HPV)   Mammogram: {Mammo f/u:25212}, or sooner if problems Colonoscopy: {TCS f/u:25213}, or sooner if problems  No orders of the defined types were placed in this encounter.   Meds: No orders of the defined types were placed in this encounter.   Follow-up: Return in 1 year (on 03/03/2025) for Annual exam.  NINOSHKA WAINWRIGHT, CNM 03/03/2024 2:24 PM

## 2024-03-10 ENCOUNTER — Encounter: Payer: Self-pay | Admitting: Family Medicine

## 2024-03-10 DIAGNOSIS — F900 Attention-deficit hyperactivity disorder, predominantly inattentive type: Secondary | ICD-10-CM

## 2024-03-10 MED ORDER — AMPHETAMINE-DEXTROAMPHET ER 20 MG PO CP24: 20.0000 mg | ORAL_CAPSULE | Freq: Every day | ORAL | 0 refills | Status: DC

## 2024-03-10 MED ORDER — AMPHETAMINE-DEXTROAMPHETAMINE 10 MG PO TABS: 10.0000 mg | ORAL_TABLET | ORAL | 0 refills | Status: DC | PRN

## 2024-03-12 ENCOUNTER — Encounter: Payer: Self-pay | Admitting: Certified Nurse Midwife

## 2024-03-12 LAB — CYTOLOGY - PAP
Adequacy: ABSENT
Comment: NEGATIVE
Diagnosis: UNDETERMINED — AB
High risk HPV: NEGATIVE

## 2024-03-13 ENCOUNTER — Ambulatory Visit: Payer: Self-pay | Admitting: Certified Nurse Midwife

## 2024-03-13 MED ORDER — FLUCONAZOLE 150 MG PO TABS: 150.0000 mg | ORAL_TABLET | ORAL | 0 refills | Status: AC

## 2024-04-07 ENCOUNTER — Encounter: Admitting: Obstetrics & Gynecology

## 2024-04-22 ENCOUNTER — Encounter: Payer: Self-pay | Admitting: Family Medicine

## 2024-04-22 DIAGNOSIS — F900 Attention-deficit hyperactivity disorder, predominantly inattentive type: Secondary | ICD-10-CM

## 2024-04-22 MED ORDER — AMPHETAMINE-DEXTROAMPHETAMINE 10 MG PO TABS: 10.0000 mg | ORAL_TABLET | ORAL | 0 refills | Status: DC | PRN

## 2024-04-22 MED ORDER — AMPHETAMINE-DEXTROAMPHET ER 20 MG PO CP24: 20.0000 mg | ORAL_CAPSULE | Freq: Every day | ORAL | 0 refills | Status: DC

## 2024-04-22 NOTE — Telephone Encounter (Signed)
 Refill request for  Adderall 10 mg LR  03/10/24,  #30, 0 rf  Adderall XR 20 mg LR  03/10/24, #30, 0 rf LOV 01/31/24 FOV 06/04/24  Please review and advise.  Thanks. Dm/cma

## 2024-04-28 ENCOUNTER — Encounter: Admitting: Obstetrics and Gynecology

## 2024-05-21 NOTE — Progress Notes (Signed)
 Referring Provider:  Harlene Cisco CNM  HPI:  April Harris is a 33 y.o.  [phone]  who presents today for evaluation and management of abnormal cervical cytology.  Patient also woke up with dysuria this morning and would like to be checked for a UTI.  Prior pap smears:  03/03/2024:  ASCUS HPV - 05/25/2021:  NILM HPV - 04/04/2020:  LSIL HPV + (undiff) 10/06/2018:  NILM 10/28/2017:  NILM  Prior cervical / vaginal findings: Colposcopy 05/06/2020:  CIN 1 @ 5:00, CIN 2 @ 12:00, ECC benign  Prior cervical treatment(s): LEEP 06/20/2020:  ectocervix margins uninvolved; endocervix no malignancy  Symptoms/History:  -Abnormal vaginal discharge: none -Postmenopausal: no -Intermenstrual bleeding: spotting occassionally -Postcoital bleeding: none -Bleeding problems (non-gyn): none -Contraception: micronor  -Number of current sexual partners: 1 -Number of partners in lifetime: >10 -History of a high risk partner: none -History of STDs: gonorrhea 2014 -Smoking: former, quit 2021 -Gardasil Vaccine: two, did not complete series      ROS:  Pertinent items are noted in HPI.  OB History  Gravida Para Term Preterm AB Living  4 1 1  3 1   SAB IAB Ectopic Multiple Live Births  2 1  0 1    # Outcome Date GA Lbr Len/2nd Weight Sex Type Anes PTL Lv  4 Term 12/05/21 [redacted]w[redacted]d  8 lb 0.8 oz (3.65 kg) F CS-LTranv Spinal  LIV  3 SAB 2010          2 SAB 2008          1 IAB 2007            Obstetric Comments  Cervical LEEP 06/20/2020    Past Medical History:  Diagnosis Date   Allergy 08/20/1998   Anemia    end of pregnancy   Anxiety    Asthma    Depression    GERD (gastroesophageal reflux disease)    H/O LEEP (loop electrosurgical excision procedure) of cervix complicating pregnancy    History of colposcopy    IBS (irritable bowel syndrome)    LGSIL on Pap smear of cervix     Past Surgical History:  Procedure Laterality Date   CESAREAN SECTION N/A 12/05/2021   Procedure: CESAREAN  SECTION;  Surgeon: Victor Claudell JONELLE, MD;  Location: [facility];  Service: Obstetrics;  Laterality: N/A;   COLPOSCOPY     DILATION AND CURETTAGE OF UTERUS     FRACTURE SURGERY  06/11/22   INDUCED ABORTION     ORIF ANKLE FRACTURE Right 06/11/2022   Procedure: OPEN REDUCTION INTERNAL FIXATION (ORIF) ANKLE FRACTURE,  syndesmosis repair ,  with allograft;  Surgeon: Barton Drape, MD;  Location: [facility];  Service: Orthopedics;  Laterality: Right;  60 min choice   SYNDESMOSIS REPAIR Right 06/11/2022   Procedure: SYNDESMOSIS REPAIR RIGHT ANKLE;  Surgeon: Barton Drape, MD;  Location: [facility];  Service: Orthopedics;  Laterality: Right;   TONSILLECTOMY      SOCIAL HISTORY:  Social History   Substance and Sexual Activity  Alcohol Use Yes   Comment: occassionally    Social History   Substance and Sexual Activity  Drug Use No     Family History  Problem Relation Age of Onset   Depression Mother    Hyperlipidemia Mother    Asthma Mother    ADD / ADHD Brother    Depression Maternal Grandmother    Heart attack Maternal Grandfather    Leukemia Maternal Grandfather    Hypertension Paternal Grandmother  Melanoma Paternal Grandfather    Cancer Neg Hx    Breast cancer Neg Hx    Ovarian cancer Neg Hx     ALLERGIES:  Penicillins  She has a current medication list which includes the following prescription(s): amphetamine -dextroamphetamine , amphetamine -dextroamphetamine , calcium carbonate, cholecalciferol, fluticasone , levocetirizine, multivitamin, eye allergy relief, norethindrone , omeprazole , and sertraline .  Physical Exam: -Vitals:  BP 106/85   Pulse 80   Ht 5' 5 (1.651 m)   Wt 184 lb (83.5 kg)   LMP 05/05/2024 (Approximate)   BMI 30.62 kg/m   PROCEDURE: Colposcopy performed with 4% acetic acid and Lugol's after informed consent obtained. Urine pregnancy test negative.  Physical Exam Vitals and nursing note reviewed. Exam  conducted with a chaperone present.  Constitutional:      Appearance: Normal appearance.  HENT:     Head: Normocephalic and atraumatic.  Eyes:     Extraocular Movements: Extraocular movements intact.  Pulmonary:     Effort: Pulmonary effort is normal.  Genitourinary:    General: Normal vulva.     Vagina: Normal. No vaginal discharge, tenderness, bleeding or lesions.     Cervix: No discharge, friability, lesion or erythema.  Musculoskeletal:        General: Normal range of motion.  Neurological:     General: No focal deficit present.     Mental Status: She is alert.  Psychiatric:        Mood and Affect: Mood normal.                  -Cervical dilation required, successful w/plastic dilators and tenaculum              -Aceto-white Lesions Location(s): None              -Biopsy performed: None              -ECC indicated and performed: Yes.     -Satisfactory colposcopy: Yes.      -Evidence of Invasive cervical CA :  NO  ASSESSMENT:  April Harris is a 33 y.o. [phone] with ASCUS and HPV-HR negative pap which is is the 24mos cotesting after CIN2 s/p LEEP. Colposcopy today performed as above without complications. UA done for dysuria, with +NIT.  -Urine sent for culture, start Macrobid  today, Rx sent -ECC and zero cervical bx sent to pathology -Aftercare instructions for home reviewed, si/sx of when to call/return discussed. -Discussed possible outcomes based on pathology; will MyChart with results   Estil Mangle, DO [facility]

## 2024-05-26 ENCOUNTER — Other Ambulatory Visit (HOSPITAL_COMMUNITY)
Admission: RE | Admit: 2024-05-26 | Discharge: 2024-05-26 | Disposition: A | Discharge status: Home / Self Care | Source: Ambulatory Visit | Attending: Obstetrics | Admitting: Obstetrics

## 2024-05-26 ENCOUNTER — Ambulatory Visit: Admitting: Obstetrics

## 2024-05-26 ENCOUNTER — Encounter: Payer: Self-pay | Admitting: Obstetrics

## 2024-05-26 VITALS — BP 106/85 | HR 80 | Ht 65.0 in | Wt 184.0 lb

## 2024-05-26 DIAGNOSIS — R300 Dysuria: Secondary | ICD-10-CM | POA: Diagnosis not present

## 2024-05-26 DIAGNOSIS — R87610 Atypical squamous cells of undetermined significance on cytologic smear of cervix (ASC-US): Secondary | ICD-10-CM

## 2024-05-26 DIAGNOSIS — N858 Other specified noninflammatory disorders of uterus: Secondary | ICD-10-CM | POA: Diagnosis not present

## 2024-05-26 DIAGNOSIS — Z3202 Encounter for pregnancy test, result negative: Secondary | ICD-10-CM | POA: Diagnosis not present

## 2024-05-26 DIAGNOSIS — R399 Unspecified symptoms and signs involving the genitourinary system: Secondary | ICD-10-CM

## 2024-05-26 DIAGNOSIS — Z01812 Encounter for preprocedural laboratory examination: Secondary | ICD-10-CM

## 2024-05-26 LAB — POCT URINE PREGNANCY: Preg Test, Ur: NEGATIVE

## 2024-05-26 LAB — POCT URINALYSIS DIPSTICK
Bilirubin, UA: NEGATIVE
Blood, UA: POSITIVE
Glucose, UA: NEGATIVE
Ketones, UA: NEGATIVE
Nitrite, UA: POSITIVE
Protein, UA: POSITIVE — AB
Spec Grav, UA: 1.01 (ref 1.010–1.025)
Urobilinogen, UA: 0.2 U/dL
pH, UA: 6 (ref 5.0–8.0)

## 2024-05-26 MED ORDER — NITROFURANTOIN MONOHYD MACRO 100 MG PO CAPS: 100.0000 mg | ORAL_CAPSULE | Freq: Two times a day (BID) | ORAL | 0 refills | Status: AC

## 2024-05-28 LAB — SURGICAL PATHOLOGY

## 2024-05-30 LAB — URINE CULTURE

## 2024-06-02 ENCOUNTER — Ambulatory Visit: Payer: Self-pay | Admitting: Obstetrics

## 2024-06-04 ENCOUNTER — Encounter: Payer: Self-pay | Admitting: Family Medicine

## 2024-06-04 ENCOUNTER — Ambulatory Visit (INDEPENDENT_AMBULATORY_CARE_PROVIDER_SITE_OTHER): Admitting: Family Medicine

## 2024-06-04 VITALS — BP 118/74 | HR 84 | Temp 97.0°F | Ht 65.0 in | Wt 181.6 lb

## 2024-06-04 DIAGNOSIS — F900 Attention-deficit hyperactivity disorder, predominantly inattentive type: Secondary | ICD-10-CM

## 2024-06-04 DIAGNOSIS — R87610 Atypical squamous cells of undetermined significance on cytologic smear of cervix (ASC-US): Secondary | ICD-10-CM | POA: Insufficient documentation

## 2024-06-04 DIAGNOSIS — F1021 Alcohol dependence, in remission: Secondary | ICD-10-CM

## 2024-06-04 MED ORDER — AMPHETAMINE-DEXTROAMPHET ER 20 MG PO CP24: 20.0000 mg | ORAL_CAPSULE | Freq: Every day | ORAL | 0 refills | Status: DC

## 2024-06-04 MED ORDER — AMPHETAMINE-DEXTROAMPHETAMINE 10 MG PO TABS: 10.0000 mg | ORAL_TABLET | ORAL | 0 refills | Status: DC | PRN

## 2024-06-04 NOTE — Assessment & Plan Note (Signed)
 Stable. We will continue Adderall XR 20 mg q am and Adderall 10 mg q pm.

## 2024-06-04 NOTE — Assessment & Plan Note (Signed)
 Drinks about 1 bottle of wine per week. Has been able to keep alcohol intake to a reasonable level.

## 2024-06-04 NOTE — Progress Notes (Signed)
 [facility] [address] [place] [address] Dept: [phone] Dept Fax: [phone]  Chronic Care Office Visit  Subjective:    Patient ID: April Harris, female    DOB: Jul 06, 1991, 33 y.o..   MRN: [id number]  Chief Complaint  Patient presents with   ADHD    6 month f/u ADHD.  No concerns.     History of Present Illness:  Patient is in today for reassessment of chronic medical issues.  April Harris has adult ADHD, diagnosed in 2022. She often struggled with issues of focus in her adolescence. She had a brother with hyperactive-type ADHD that was treated as a teen. She feels that because she was not hyperactive, her family did not recognize her issues. Dr. Blenda had her complete an ASRS which was consistent with ADHD. She was managed on Adderall 10 mg twice daily. When she became pregnant, later that year, she went off of the medication. I  restarted her therapy last winter with Adderall XR 20 mg daily. We later added Adderal (IR) 10 mg q pm. Overall, she is doing wella t this point. She has had some weight loss int he past 4 months, which she attributes to dietary changes, increased activity, and notes she is now back to her pre-pregnancy weight.  April Harris had a pap smear that showed ASCUS in July. She has since had a colposcopy recently. Biopsies showed normal tissue. She happened to have a UTI at the same time. She has completed her antibiotics and is feeling improved.  Past Medical History: Patient Active Problem List   Diagnosis Date Noted   Attention deficit hyperactivity disorder (ADHD), predominantly inattentive type 09/04/2023   Right wrist pain 06/17/2023   Anxiety with depression 09/25/2022   Perennial allergic rhinitis 09/25/2022   GERD (gastroesophageal reflux disease) 09/25/2022   S/P ORIF (open reduction internal fixation) right lateral malleolar fracture 09/25/2022   Light tobacco smoker 09/25/2022    Annual physical exam 12/07/2021   History of loop electrosurgical excision procedure (LEEP) 06/01/2021   Vitamin D  insufficiency 08/11/2019   Chronic alcoholism in remission (HCC) 05/30/2019   Class 1 obesity due to excess calories with body mass index (BMI) of 33.0 to 33.9 in adult 05/30/2019   Spondylolisthesis 05/27/2019   Hyperlipidemia 11/12/2017   Menorrhagia with regular cycle 11/07/2017   Irritable bowel syndrome with diarrhea 11/16/2009   Past Surgical History:  Procedure Laterality Date   CESAREAN SECTION N/A 12/05/2021   Procedure: CESAREAN SECTION;  Surgeon: Victor Claudell JONELLE, MD;  Location: [facility];  Service: Obstetrics;  Laterality: N/A;   COLPOSCOPY     DILATION AND CURETTAGE OF UTERUS     FRACTURE SURGERY  06/11/22   INDUCED ABORTION     ORIF ANKLE FRACTURE Right 06/11/2022   Procedure: OPEN REDUCTION INTERNAL FIXATION (ORIF) ANKLE FRACTURE,  syndesmosis repair ,  with allograft;  Surgeon: Barton Drape, MD;  Location: [facility];  Service: Orthopedics;  Laterality: Right;  60 min choice   SYNDESMOSIS REPAIR Right 06/11/2022   Procedure: SYNDESMOSIS REPAIR RIGHT ANKLE;  Surgeon: Barton Drape, MD;  Location: [facility];  Service: Orthopedics;  Laterality: Right;   TONSILLECTOMY     Family History  Problem Relation Age of Onset   Depression Mother    Hyperlipidemia Mother    Asthma Mother    ADD / ADHD Brother    Depression Maternal Grandmother    Heart attack Maternal Grandfather    Leukemia  Maternal Grandfather    Hypertension Paternal Grandmother    Melanoma Paternal Grandfather    Cancer Neg Hx    Breast cancer Neg Hx    Ovarian cancer Neg Hx    Outpatient Medications Prior to Visit  Medication Sig Dispense Refill   calcium carbonate (OSCAL) 1500 (600 Ca) MG TABS tablet Take 1,500 mg by mouth daily.     cholecalciferol (VITAMIN D3) 25 MCG (1000 UNIT) tablet Take 1,000 Units by mouth daily.     fluticasone   (FLONASE ) 50 MCG/ACT nasal spray Place 2 sprays into both nostrils daily. 16 g 0   levocetirizine (XYZAL) 5 MG tablet Take 5 mg by mouth every evening.     Multiple Vitamin (MULTIVITAMIN) tablet Take 1 tablet by mouth daily.     naphazoline-pheniramine (EYE ALLERGY RELIEF) 0.025-0.3 % ophthalmic solution 1 drop 4 (four) times daily as needed for eye irritation or allergies.     norethindrone  (MICRONOR ) 0.35 MG tablet Take 1 tablet (0.35 mg total) by mouth daily. 84 tablet 4   omeprazole  (PRILOSEC) 20 MG capsule TAKE 1 CAPSULE BY MOUTH EVERY DAY 90 capsule 0   sertraline  (ZOLOFT ) 50 MG tablet TAKE 1 TABLET BY MOUTH EVERYDAY AT BEDTIME 90 tablet 3   amphetamine -dextroamphetamine  (ADDERALL XR) 20 MG 24 hr capsule Take 1 capsule (20 mg total) by mouth daily. 30 capsule 0   amphetamine -dextroamphetamine  (ADDERALL) 10 MG tablet Take 1 tablet (10 mg total) by mouth as needed. 30 tablet 0   No facility-administered medications prior to visit.   Allergies  Allergen Reactions   Penicillins Hives   Objective:   Today's Vitals   06/04/24 0818  BP: 118/74  Pulse: 84  Temp: (!) 97 F (36.1 C)  TempSrc: Temporal  SpO2: 99%  Weight: 181 lb 9.6 oz (82.4 kg)  Height: 5' 5 (1.651 m)   Body mass index is 30.22 kg/m.   General: Well developed, well nourished. No acute distress. Psych: Alert and oriented. Normal mood and affect.  Health Maintenance Due  Topic Date Due   Pneumococcal Vaccine (1 of 2 - PCV) Never done  \   Assessment & Plan:   Problem List Items Addressed This Visit       Other   ASCUS of cervix with negative high risk HPV   Colposcopy normal. Repeat pap smear due in Oct. 2026.      Attention deficit hyperactivity disorder (ADHD), predominantly inattentive type   Stable. We will continue Adderall XR 20 mg q am and Adderall 10 mg q pm.      Relevant Medications   amphetamine -dextroamphetamine  (ADDERALL) 10 MG tablet   amphetamine -dextroamphetamine  (ADDERALL XR) 20 MG  24 hr capsule   Chronic alcoholism in remission (HCC) - Primary   Drinks about 1 bottle of wine per week. Has been able to keep alcohol intake to a reasonable level.       Return in about 8 months (around 02/02/2025) for Annual preventative care.   Garnette CHRISTELLA Simpler, MD

## 2024-06-04 NOTE — Assessment & Plan Note (Signed)
 Colposcopy normal. Repeat pap smear due in Oct. 2026.

## 2024-07-23 ENCOUNTER — Encounter: Payer: Self-pay | Admitting: Family Medicine

## 2024-07-23 DIAGNOSIS — F900 Attention-deficit hyperactivity disorder, predominantly inattentive type: Secondary | ICD-10-CM

## 2024-07-23 NOTE — Telephone Encounter (Signed)
 Refill request for  Adderall 10 mg LR 06/04/24, #30, 0 rf  Adderall XR 20 mg LR 06/04/24, #30, 0 rf LOV  10/16/25FOV  08/07/24  Please review and advise.  Thanks. Dm/cma

## 2024-07-24 MED ORDER — AMPHETAMINE-DEXTROAMPHETAMINE 10 MG PO TABS: 10.0000 mg | ORAL_TABLET | ORAL | 0 refills | Status: AC | PRN

## 2024-07-24 MED ORDER — AMPHETAMINE-DEXTROAMPHET ER 20 MG PO CP24: 20.0000 mg | ORAL_CAPSULE | Freq: Every day | ORAL | 0 refills | Status: AC

## 2024-08-07 ENCOUNTER — Ambulatory Visit: Admitting: Family Medicine

## 2025-02-05 ENCOUNTER — Ambulatory Visit: Admitting: Family Medicine
